# Patient Record
Sex: Female | Born: 1945 | Race: Black or African American | Hispanic: No | Marital: Married | Smoking: Never smoker
Health system: Southern US, Community
[De-identification: ages and names within clinical notes are randomized; demographics above are authoritative.]

## PROBLEM LIST (undated history)

## (undated) DIAGNOSIS — Z8619 Personal history of other infectious and parasitic diseases: Secondary | ICD-10-CM

## (undated) DIAGNOSIS — K589 Irritable bowel syndrome without diarrhea: Secondary | ICD-10-CM

## (undated) DIAGNOSIS — M810 Age-related osteoporosis without current pathological fracture: Secondary | ICD-10-CM

## (undated) DIAGNOSIS — R300 Dysuria: Secondary | ICD-10-CM

## (undated) DIAGNOSIS — I480 Paroxysmal atrial fibrillation: Secondary | ICD-10-CM

## (undated) DIAGNOSIS — R011 Cardiac murmur, unspecified: Secondary | ICD-10-CM

## (undated) DIAGNOSIS — E039 Hypothyroidism, unspecified: Secondary | ICD-10-CM

## (undated) DIAGNOSIS — E7800 Pure hypercholesterolemia, unspecified: Secondary | ICD-10-CM

## (undated) DIAGNOSIS — R229 Localized swelling, mass and lump, unspecified: Secondary | ICD-10-CM

## (undated) DIAGNOSIS — Z8739 Personal history of other diseases of the musculoskeletal system and connective tissue: Secondary | ICD-10-CM

## (undated) DIAGNOSIS — J349 Unspecified disorder of nose and nasal sinuses: Secondary | ICD-10-CM

## (undated) DIAGNOSIS — K5790 Diverticulosis of intestine, part unspecified, without perforation or abscess without bleeding: Secondary | ICD-10-CM

## (undated) DIAGNOSIS — R319 Hematuria, unspecified: Secondary | ICD-10-CM

## (undated) DIAGNOSIS — I10 Essential (primary) hypertension: Secondary | ICD-10-CM

## (undated) DIAGNOSIS — M7130 Other bursal cyst, unspecified site: Secondary | ICD-10-CM

## (undated) DIAGNOSIS — L280 Lichen simplex chronicus: Secondary | ICD-10-CM

## (undated) DIAGNOSIS — M1990 Unspecified osteoarthritis, unspecified site: Secondary | ICD-10-CM

## (undated) DIAGNOSIS — K5792 Diverticulitis of intestine, part unspecified, without perforation or abscess without bleeding: Secondary | ICD-10-CM

## (undated) HISTORY — DX: Unspecified osteoarthritis, unspecified site: M19.90

## (undated) HISTORY — DX: Lichen simplex chronicus: L28.0

## (undated) HISTORY — DX: Other bursal cyst, unspecified site: M71.30

## (undated) HISTORY — DX: Unspecified disorder of nose and nasal sinuses: J34.9

## (undated) HISTORY — PX: ABDOMINAL HYSTERECTOMY: SHX81

## (undated) HISTORY — DX: Personal history of other diseases of the musculoskeletal system and connective tissue: Z87.39

## (undated) HISTORY — DX: Hematuria, unspecified: R31.9

## (undated) HISTORY — DX: Paroxysmal atrial fibrillation: I48.0

## (undated) HISTORY — PX: AV FISTULA REPAIR: SHX563

## (undated) HISTORY — PX: FOOT SURGERY: SHX648

## (undated) HISTORY — PX: VAGINAL HYSTERECTOMY: SHX2639

## (undated) HISTORY — DX: Cardiac murmur, unspecified: R01.1

## (undated) HISTORY — DX: Age-related osteoporosis without current pathological fracture: M81.0

## (undated) HISTORY — PX: BUNIONECTOMY: SHX129

## (undated) HISTORY — DX: Personal history of other infectious and parasitic diseases: Z86.19

## (undated) HISTORY — DX: Irritable bowel syndrome, unspecified: K58.9

## (undated) HISTORY — DX: Dysuria: R30.0

## (undated) HISTORY — PX: ANKLE SURGERY: SHX546

## (undated) HISTORY — DX: Essential (primary) hypertension: I10

## (undated) HISTORY — PX: APPENDECTOMY: SHX54

## (undated) HISTORY — PX: COLONOSCOPY: SHX174

## (undated) HISTORY — DX: Localized swelling, mass and lump, unspecified: R22.9

## (undated) HISTORY — PX: VESICOVAGINAL FISTULA CLOSURE W/ TAH: SUR271

---

## 2009-06-10 ENCOUNTER — Ambulatory Visit: Payer: Self-pay | Admitting: Family Medicine

## 2010-07-20 ENCOUNTER — Ambulatory Visit: Payer: Self-pay | Admitting: Family Medicine

## 2011-09-21 ENCOUNTER — Ambulatory Visit: Payer: Self-pay | Admitting: Podiatry

## 2011-10-26 ENCOUNTER — Ambulatory Visit: Payer: Self-pay | Admitting: Family Medicine

## 2012-11-04 ENCOUNTER — Encounter: Payer: Self-pay | Admitting: Podiatry

## 2012-11-11 ENCOUNTER — Ambulatory Visit: Payer: Self-pay | Admitting: Family Medicine

## 2012-11-18 ENCOUNTER — Ambulatory Visit: Payer: Self-pay | Admitting: Family Medicine

## 2012-11-19 ENCOUNTER — Encounter: Payer: Self-pay | Admitting: Podiatry

## 2012-12-25 ENCOUNTER — Ambulatory Visit: Payer: Self-pay | Admitting: Otolaryngology

## 2013-02-12 ENCOUNTER — Ambulatory Visit: Payer: Self-pay | Admitting: Podiatry

## 2013-02-27 ENCOUNTER — Ambulatory Visit: Payer: Self-pay | Admitting: Podiatry

## 2013-03-05 ENCOUNTER — Ambulatory Visit: Payer: Self-pay | Admitting: Podiatry

## 2013-03-18 ENCOUNTER — Encounter: Payer: Self-pay | Admitting: Podiatry

## 2013-03-19 ENCOUNTER — Encounter: Payer: Self-pay | Admitting: Podiatry

## 2013-03-19 ENCOUNTER — Ambulatory Visit (INDEPENDENT_AMBULATORY_CARE_PROVIDER_SITE_OTHER): Payer: Medicare Other | Admitting: Podiatry

## 2013-03-19 VITALS — BP 120/74 | HR 71 | Resp 16 | Ht 69.0 in | Wt 206.0 lb

## 2013-03-19 DIAGNOSIS — M76829 Posterior tibial tendinitis, unspecified leg: Secondary | ICD-10-CM

## 2013-03-19 DIAGNOSIS — M76822 Posterior tibial tendinitis, left leg: Secondary | ICD-10-CM

## 2013-03-19 NOTE — Progress Notes (Signed)
Rebecca Anthony presents today for followup of her posterior tibial tendon repair for left foot. She states while she was at the beach she walk on the beach with certain shoes which seem to stress the tendon somewhat and she was concerned about this. I answered questions today regarding shoe type and expectations after the surgery. She is also concerned about a mild hallux interphalangeal is present left hallux.  Objective: Vital signs are stable she is alert and oriented x3 pulses to the left foot are palpable. Mild edema around the surgical site with mild tenderness of the posterior tibial tendon on palpation. Evaluation of the first metatarsophalangeal joint demonstrates a very good range of motion a very mild hallux interphalangeal with early osteoarthritic changes suspected.  Assessment: Probably a mild posterior tibial tendinitis and hallux interphalangeal left foot.  Plan: Continue conservative therapies such as rest ice compression and elevation for the mild tendinitis as well as anti-inflammatories consisting of 600 mg of ibuprofen 3 times a day with food. Followup with me as needed.

## 2013-07-23 ENCOUNTER — Ambulatory Visit: Payer: Self-pay | Admitting: Family Medicine

## 2013-08-18 ENCOUNTER — Ambulatory Visit: Payer: Self-pay | Admitting: Anesthesiology

## 2013-08-18 ENCOUNTER — Ambulatory Visit: Payer: Self-pay | Admitting: Gastroenterology

## 2013-08-18 LAB — HM COLONOSCOPY

## 2013-08-26 ENCOUNTER — Encounter: Payer: Self-pay | Admitting: *Deleted

## 2013-08-27 ENCOUNTER — Encounter (INDEPENDENT_AMBULATORY_CARE_PROVIDER_SITE_OTHER): Payer: Self-pay

## 2013-08-27 ENCOUNTER — Ambulatory Visit (INDEPENDENT_AMBULATORY_CARE_PROVIDER_SITE_OTHER): Payer: Medicare Other | Admitting: Cardiology

## 2013-08-27 ENCOUNTER — Encounter: Payer: Self-pay | Admitting: Cardiology

## 2013-08-27 VITALS — BP 104/62 | HR 60 | Ht 69.0 in | Wt 195.0 lb

## 2013-08-27 DIAGNOSIS — I480 Paroxysmal atrial fibrillation: Secondary | ICD-10-CM

## 2013-08-27 DIAGNOSIS — R9431 Abnormal electrocardiogram [ECG] [EKG]: Secondary | ICD-10-CM

## 2013-08-27 DIAGNOSIS — I4891 Unspecified atrial fibrillation: Secondary | ICD-10-CM

## 2013-08-27 DIAGNOSIS — R011 Cardiac murmur, unspecified: Secondary | ICD-10-CM

## 2013-08-27 DIAGNOSIS — I10 Essential (primary) hypertension: Secondary | ICD-10-CM

## 2013-08-27 MED ORDER — ASPIRIN 81 MG PO TABS: 81.0000 mg | ORAL_TABLET | Freq: Every day | ORAL | Status: DC

## 2013-08-27 NOTE — Patient Instructions (Addendum)
Your physician has recommended you make the following change in your medication:  Start Aspirin 81 mg daily  Stop Lotensin for now   Your physician has requested that you have an echocardiogram. Echocardiography is a painless test that uses sound waves to create images of your heart. It provides your doctor with information about the size and shape of your heart and how well your heart's chambers and valves are working. This procedure takes approximately one hour. There are no restrictions for this procedure.   Your physician has recommended that you wear an event monitor. Event monitors are medical devices that record the heart's electrical activity. Doctors most often [place] these monitors to diagnose arrhythmias. Arrhythmias are problems with the speed or rhythm of the heartbeat. The monitor is a small, portable device. You can wear one while you do your normal daily activities. This is usually used to diagnose what is causing palpitations/syncope (passing out).   [place]  Your caregiver has ordered a Stress Test with nuclear imaging. The purpose of this test is to evaluate the blood supply to your heart muscle. This procedure is referred to as a "Non-Invasive Stress Test." This is because other than having an IV started in your vein, nothing is inserted or "invades" your body. Cardiac stress tests are done to find areas of poor blood flow to the heart by determining the extent of coronary artery disease (CAD). Some patients exercise on a treadmill, which naturally increases the blood flow to your heart, while others who are  unable to walk on a treadmill due to physical limitations have a pharmacologic/chemical stress agent called Lexiscan . This medicine will mimic walking on a treadmill by temporarily increasing your coronary blood flow.   Please note: these test may take anywhere between 2-4 hours to complete  PLEASE REPORT TO [place] AT THE FIRST DESK WILL  DIRECT YOU WHERE TO GO  Date of Procedure:_____________________________________  Arrival Time for Procedure:______________________________  Instructions regarding medication:     _x_:  Hold betablocker(s) night before procedure and morning of procedure: Metoprolol    PLEASE NOTIFY THE OFFICE AT LEAST 24 HOURS IN ADVANCE IF YOU ARE UNABLE TO KEEP YOUR APPOINTMENT.  [phone] AND  PLEASE NOTIFY NUCLEAR MEDICINE AT [facility] AT LEAST 24 HOURS IN ADVANCE IF YOU ARE UNABLE TO KEEP YOUR APPOINTMENT. [phone]  How to prepare for your Myoview test:  1. Do not eat or drink after midnight 2. No caffeine for 24 hours prior to test 3. No smoking 24 hours prior to test. 4. Your medication may be taken with water.  If your doctor stopped a medication because of this test, do not take that medication. 5. Ladies, please do not wear dresses.  Skirts or pants are appropriate. Please wear a short sleeve shirt. 6. No perfume, cologne or lotion. 7. Wear comfortable walking shoes. No heels!       Your physician recommends that you schedule a follow-up appointment in:  With Dr. Ellyn Hack after your test

## 2013-08-28 ENCOUNTER — Telehealth: Payer: Self-pay

## 2013-08-28 NOTE — Telephone Encounter (Signed)
Just wear it as long as she will.   Maybe that will be helpful.  Leonie Man, MD

## 2013-08-28 NOTE — Telephone Encounter (Signed)
Pt has some questions about tests that has been ordered, also wants to talk about a monitor. Please call.

## 2013-08-28 NOTE — Telephone Encounter (Signed)
That totally defeats the purpose of why I want her to wear it -- but whatever, if she isn't willing, she isnt willing.  I still don't think she gets why I wanted to use it.  I am trying to see how much Afib burden she has, in order to determine the appropriate decision about blood thinner vs. Aspirin alone.    4 days will not give me much of a data point.    Not going to force her -- I spent > 45 min with her yesterday trying to explain things.  Based upon her wishes - we will use ASA only & stay on BB.  She must be fine with the risk of stroke being higher with ASA than either warfarin & the NOAC agents.  Leonie Man, MD

## 2013-08-28 NOTE — Telephone Encounter (Signed)
Patient does not want to wear cardiac monitor for 30 days  She would like to wear it for around 4 days  Is this negotiable?

## 2013-08-28 NOTE — Telephone Encounter (Signed)
Now not know. Sorry typo

## 2013-08-28 NOTE — Telephone Encounter (Signed)
Pt scheduled for echo and myoview  Patient given time, date and instructions  Patient verbalized understanding   Myoview on 09/01/13 @10  am

## 2013-08-28 NOTE — Telephone Encounter (Signed)
So should we just cancel event monitor all together for know?

## 2013-08-29 ENCOUNTER — Telehealth: Payer: Self-pay | Admitting: *Deleted

## 2013-08-29 ENCOUNTER — Encounter: Payer: Self-pay | Admitting: Cardiology

## 2013-08-29 DIAGNOSIS — I10 Essential (primary) hypertension: Secondary | ICD-10-CM | POA: Insufficient documentation

## 2013-08-29 DIAGNOSIS — R011 Cardiac murmur, unspecified: Secondary | ICD-10-CM | POA: Insufficient documentation

## 2013-08-29 DIAGNOSIS — R9431 Abnormal electrocardiogram [ECG] [EKG]: Secondary | ICD-10-CM | POA: Insufficient documentation

## 2013-08-29 DIAGNOSIS — I480 Paroxysmal atrial fibrillation: Secondary | ICD-10-CM | POA: Insufficient documentation

## 2013-08-29 NOTE — Assessment & Plan Note (Signed)
This does seem like a chronic diagnosis for her. I have no concept of water to A. fib burden is. I went through the explanation of what atrial fibrillation is mechanistically, and the concerns of heart failure versus stroke.  I spent probably 20 minutes going over the various options falling on her CHA2DS2Vasc score of 57 (age, female sex, and hypertension).   The final issue is that she is at risk for stroke and should consider being on full anticoagulation with either a NOAC or warfarin. She is very reluctant to consider doing this, stating that she was to stay as conservative as possible.  My overall recommendation was for her to wear an event monitor for a month in order for [place] to determine her A. fib burden. If high, this was used more toward falling evaluation, however if she has very rare/bleeding episodes, while not favorable, aspirin would be likely be less under treatment.  Plan: Continue metoprolol, start with baby aspirin today  LexiScan Myoview to assess for potential ischemic etiology of A. fib given her age and hypertension. Using LexiScan to avoid having to hold her beta blocker.  30 day event monitor (provided she will wear it)  2-D echocardiogram to assess LV function and chamber sizes, most notably the left atrial size.  If she remains relatively asymptomatic with short bursts, my recommendation would be to simply stay with rate control as opposed to rhythm control.

## 2013-08-29 NOTE — Progress Notes (Signed)
PATIENTMartika Anthony MRN: [id number] DOB: Jul 06, 1945 PCP: Ashok Norris, MD  Clinic Note: Chief Complaint  Patient presents with  . other    Ref by PCP due to abn ekg. Meds reviewed verbally with pt.   HPI: Rebecca Anthony is a 68 y.o. female with a PMH below who presents today for cardiology consultation for atrial fibrillation. She currently has a distant history of having atrial fibrillation years ago in the past and was treated with quinidine when she was living in [place].  She stopped taking it for some reason and was doing relatively well.  She was in her usual state of health, but had an episode of rapid A. fib following her colonoscopy at [facility]. She relates a story that she was feeling a little bit woozy after the study, and drank a can of Coke shortly after that she went in this rapid rhythm. EKG demonstrated A. fib with RVR heart rate 109. She was treated with IV medications and then sent home to followup with her PCP. She was started on metoprolol for rate control, and comes in today in normal sinus rhythm feeling well.  Interval History: Since that one episode, she has noted a few times where she's felt that her heart will beat fast and pounding sensation. She feels a little dizzy and jittery when this happens but doesn't really note feeling short of breath. When it occurs she may get old short of breath with exertion however. Most of these episodes only last 15-20 minutes. The longest has been a couple hours. She did note that in the past that she drank caffeine it would stimulate his spells. She is therefore cut back her caffeine intake significantly. His spells are also exacerbated by stress and sweets. She has adjusted her diet and tries to remain calm.   When she went to her PCP she described the sensation of fluttering and palpitations. However she denies any syncope or near significant no TIA or amaurosis fugax symptoms. No melena, hematochezia, hematuria, epistaxis. She also  denies any heart failure symptoms and PND, orthopnea or edema. No chest tightness or pressure at rest or exertion.  Past Medical History  Diagnosis Date  . Sinus problem   . History of swelling of feet   . Osteoporosis, unspecified   . Synovial cyst, unspecified   . Osteoarthrosis, unspecified whether generalized or localized, unspecified site   . Hypertension   . PAF (paroxysmal atrial fibrillation)     Diagnosed years ago -- originally on Quinidine  . Lichenification and lichen simplex chronicus   . Localized superficial swelling, mass, or lump   . Dysuria   . Irritable bowel syndrome   . Hematuria, unspecified   . Heart murmur     Prior Cardiac Evaluation and Past Surgical History: Past Surgical History  Procedure Laterality Date  . Appendectomy    . Vesicovaginal fistula closure w/ tah    . Av fistula repair    . Foot surgery Left   . Vaginal hysterectomy      Allergies  Allergen Reactions  . Sulfa Antibiotics   . Anaprox [Naproxen Sodium] Rash    Current Outpatient Prescriptions  Medication Sig Dispense Refill  . alendronate (FOSAMAX) 70 MG tablet Take 70 mg by mouth once a week. Take with a full glass of water on an empty stomach.      . cetirizine (ZYRTEC) 10 MG tablet Take 10 mg by mouth as needed.       . Cholecalciferol (VITAMIN D3) 50000  UNITS CAPS Take by mouth once a week.      . metoprolol succinate (TOPROL-XL) 25 MG 24 hr tablet Take 25 mg by mouth daily.       . montelukast (SINGULAIR) 10 MG tablet Take 10 mg by mouth at bedtime.       Marland Kitchen omeprazole (PRILOSEC) 40 MG capsule Take 40 mg by mouth daily.       . ranitidine (ZANTAC) 150 MG tablet Take 150 mg by mouth at bedtime.       Marland Kitchen aspirin 81 MG tablet Take 1 tablet (81 mg total) by mouth daily.  30 tablet     No current facility-administered medications for this visit.   History   Social History Narrative   She is married. Recently moved from [facility] back to [place].   Never drinks alcohol. Has never smoked.    Minimal amount of caffeine intake.   She is active, but not with regular exercise.   Family History:  Diabetes in her mother; Heart disease in her father; Heart failure in her mother; High blood pressure in her father; Hypertension in her father and mother.  ROS: A comprehensive Review of Systems - Negative except Episodes noted above in history of present illness. Otherwise she is doing well. No recent boluses. No GI or GU symptoms.  PHYSICAL EXAM BP 104/62  Pulse 60  Ht 5\' 9"  (1.753 m)  Wt 195 lb (88.451 kg)  BMI 28.78 kg/m2 General appearance: alert, cooperative, appears stated age, no distress and  pleaasantt, well-nourished and well-groomed. Answers questions appropriately. HEENT: [place]/AT, EOMI, MMM, anicteric sclera Neck: no adenopathy, no carotid bruit, no JVD, supple, symmetrical, trachea midline and thyroid not enlarged, symmetric, no tenderness/mass/nodules Lungs: clear to auscultation bilaterally, normal percussion bilaterally and  nonlabored, good air movement Heart: regular rate and rhythm, S1, S2 normal, no click, rub or gallop and normal apical impulse; possible soft 1/6 SEM at RUSB. Abdomen: soft, non-tender; bowel sounds normal; no masses,  no organomegaly Extremities: extremities normal, atraumatic, no cyanosis or edema Pulses: 2+ and symmetric Skin: Skin color, texture, turgor normal. No rashes or lesions Neurologic: Alert and oriented X 3, normal strength and tone. Normal symmetric reflexes. Normal coordination and gait; CN II-1XII grossly intact   Adult ECG Report --  clinic visit  Rate:  60 ;  Rhythm: normal sinus rhythm; normal axis, intervals, low voltage.  Narrative Interpretation:  normal EKG  EKG: From [facility] 5/4. atrial fibrillation, rate 109 (RVR), with PVCs or aberrantly conducted complexes. He further Nonspecific ST and T waves changes the  Recent Labs: labs ordered by PCP. Not resulted   ASSESSMENT / PLAN:  68 year old woman with history of PAF, who  sounds like she has more episodes and has been on 2 but was surreptitiously noted to be in A. fib with RVR following her colonoscopy. It did seem to be triggered by caffeine. She seems to be tolerating metoprolol well without any problems.   PAF (paroxysmal atrial fibrillation) This does seem like a chronic diagnosis for her. I have no concept of water to A. fib burden is. I went through the explanation of what atrial fibrillation is mechanistically, and the concerns of heart failure versus stroke.  I spent probably 20 minutes going over the various options falling on her CHA2DS2Vasc score of 41 (age, female sex, and hypertension).   The final issue is that she is at risk for stroke and should consider being on full anticoagulation with either a NOAC or warfarin. She  is very reluctant to consider doing this, stating that she was to stay as conservative as possible.  My overall recommendation was for her to wear an event monitor for a month in order for [place] to determine her A. fib burden. If high, this was used more toward falling evaluation, however if she has very rare/bleeding episodes, while not favorable, aspirin would be likely be less under treatment.  Plan: Continue metoprolol, start with baby aspirin today  LexiScan Myoview to assess for potential ischemic etiology of A. fib given her age and hypertension. Using LexiScan to avoid having to hold her beta blocker.  30 day event monitor (provided she will wear it)  2-D echocardiogram to assess LV function and chamber sizes, most notably the left atrial size.  If she remains relatively asymptomatic with short bursts, my recommendation would be to simply stay with rate control as opposed to rhythm control.  Hypertension She had felt a little dizzy this morning her blood pressure is lower low. I cut her ACE inhibitor/HCTZ for now. Will immediately go back to 1/2 dose once we see how her blood pressure plays out with her Toprol.  Abnormal  EKG Showing a few PVCs  Heart murmur I don't hear much of a murmur on exam, however we will assess with echocardiogram.   Orders Placed This Encounter  Procedures  . Myocardial Perfusion Imaging    Standing Status: Future     Number of Occurrences:      Standing Expiration Date: 08/27/2014    Scheduling Instructions:     To be performed at [facility]    Order Specific Question:  Where should this test be performed    Answer:  Other    Order Specific Question:  Type of stress    Answer:  Exercise    Order Specific Question:  Patient weight in lbs    Answer:  195  . EKG 12-Lead    Order Specific Question:  Where should this test be performed    Answer:  [facility]  . Cardiac event monitor    Standing Status: Future     Number of Occurrences:      Standing Expiration Date: 08/27/2014  . 2D Echocardiogram without contrast    Standing Status: Future     Number of Occurrences:      Standing Expiration Date: 08/27/2014    Order Specific Question:  Type of Echo    Answer:  Complete    Order Specific Question:  Where should this test be performed    Answer:  [facility]    Order Specific Question:  Reason for exam-Echo    Answer:  Atrail Fibrillation  427.31   Meds ordered this encounter  Medications  . metoprolol succinate (TOPROL-XL) 25 MG 24 hr tablet    Sig: Take 25 mg by mouth daily.   Marland Kitchen aspirin 81 MG tablet    Sig: Take 1 tablet (81 mg total) by mouth daily.    Dispense:  30 tablet    Followup: 1.5 months  Harel Repetto W. Ellyn Hack, M.D., M.S. Interventional Cardiolgy [facility]

## 2013-08-29 NOTE — Assessment & Plan Note (Signed)
She had felt a little dizzy this morning her blood pressure is lower low. I cut her ACE inhibitor/HCTZ for now. Will immediately go back to 1/2 dose once we see how her blood pressure plays out with her Toprol.

## 2013-08-29 NOTE — Telephone Encounter (Signed)
Patient has decided to cancel her stress test  She stated she needs to "think on it"  Called Tammy at [facility] scheduling to cancel

## 2013-08-29 NOTE — Assessment & Plan Note (Signed)
Showing a few PVCs

## 2013-08-29 NOTE — Telephone Encounter (Signed)
Patient called and needs to reschedule  stress test. Please call patient

## 2013-08-29 NOTE — Assessment & Plan Note (Signed)
I don't hear much of a murmur on exam, however we will assess with echocardiogram.

## 2013-09-01 ENCOUNTER — Telehealth: Payer: Self-pay | Admitting: *Deleted

## 2013-09-01 NOTE — Telephone Encounter (Signed)
Informed patient that Dr. Ellyn Hack would like for her to wear 30 day holter as long as she can  Patient stated she will decide about wearing the holter after her echo

## 2013-09-01 NOTE — Telephone Encounter (Signed)
OK ° °DH °

## 2013-09-09 ENCOUNTER — Other Ambulatory Visit: Payer: Self-pay

## 2013-09-09 ENCOUNTER — Other Ambulatory Visit (INDEPENDENT_AMBULATORY_CARE_PROVIDER_SITE_OTHER): Payer: Medicare Other

## 2013-09-09 DIAGNOSIS — I4891 Unspecified atrial fibrillation: Secondary | ICD-10-CM

## 2013-09-09 DIAGNOSIS — R9431 Abnormal electrocardiogram [ECG] [EKG]: Secondary | ICD-10-CM

## 2013-09-09 DIAGNOSIS — I480 Paroxysmal atrial fibrillation: Secondary | ICD-10-CM

## 2013-09-10 NOTE — Progress Notes (Signed)
Quick Note:  Echo results: Good news: Essentially normal echocardiogram and normal pump function and normal valve function. No signs to suggest heart attack.. EF: 55-60%. No regional wall motion abnormalities  Rebecca Honse W [name], MD   ______ 

## 2013-12-17 ENCOUNTER — Ambulatory Visit: Payer: Self-pay | Admitting: Family Medicine

## 2014-04-15 ENCOUNTER — Telehealth: Payer: Self-pay

## 2014-04-15 NOTE — Telephone Encounter (Signed)
Called and left message for pt to call back to schedule appt to see dr Milinda Pointer.

## 2014-04-15 NOTE — Telephone Encounter (Signed)
Pt called stating that she was seen 2 years ago and had a prescription written for PT, she wants to know if she needs another prescription written or if she needs to be seen

## 2014-06-08 ENCOUNTER — Encounter: Payer: Self-pay | Admitting: Podiatry

## 2014-06-08 ENCOUNTER — Ambulatory Visit (INDEPENDENT_AMBULATORY_CARE_PROVIDER_SITE_OTHER): Payer: Medicare Other | Admitting: Podiatry

## 2014-06-08 VITALS — BP 126/82 | HR 67 | Resp 12

## 2014-06-08 DIAGNOSIS — Q828 Other specified congenital malformations of skin: Secondary | ICD-10-CM | POA: Diagnosis not present

## 2014-06-08 DIAGNOSIS — M76822 Posterior tibial tendinitis, left leg: Secondary | ICD-10-CM

## 2014-06-08 NOTE — Progress Notes (Signed)
   Subjective:    Patient ID: Jerene Canny, female    DOB: 10/12/1945, 69 y.o.   MRN: [id number]  HPI PT STATED B/L BALL OF THE FOOT HAVE CALLUS AND BEEN PAINFUL FOR 2 WEEKS. CALLUS LOOKS THE SAME BUT WHEN PUTTING PRESSURE ON IT OR WALKING FOR VERY LONG FEET GET REALLY SENSITIVE. TRIED SOAK WITH EPSON SALT BUT NO HELP.   Review of Systems  All other systems reviewed and are negative.      Objective:   Physical Exam: I have reviewed her past medical history medications allergies surgeon social history. Pulses remain palpable left foot. She has poor keratosis sub-first and sub-second metatarsophalangeal joints of the left foot. These are consistent with abnormal pressures across the forefoot. She still has weakness in the posterior tibial tendon after her [facility] repair.        Assessment & Plan:  Assessment: Posterior tibial tendinitis with porokeratosis of the sub-first and sub-second metatarsophalangeal joint left foot. Pes planus bilateral.  Plan: We are referring her to physical therapy for chronic weakness of the posterior tibial tendon and an unsteady gait. At this point we are requesting physical therapy to perform gait training and strength training for her left foot and ankle. I also debrided all reactive hyperkeratoses for her today. We discussed appropriate shoe gear and the use of a short heeled shoe as a dress shoe.

## 2014-09-01 ENCOUNTER — Telehealth: Payer: Self-pay | Admitting: Podiatry

## 2014-09-01 NOTE — Telephone Encounter (Signed)
Spoke with patient-she feels that she is benefiting from PT. She would like to continue for a few more sessions. I sent over another PT from to [facility] stating 8 sessions total or 2-3 x weekly x 4 weeks.

## 2014-09-01 NOTE — Telephone Encounter (Signed)
Called patient-L/M-requested she call back and let us know how she was doing after completing PT and whether she felt she needed to continue PT.

## 2014-09-01 NOTE — Telephone Encounter (Signed)
PT CALLED ASKING IF SHE NEEDS TO CONTINUE THERAPY. SHE HAS COMPLETED THE ALLOTTED AMOUNT OF THERAPY AND THE THERAPIST SAID IF SHE NEEDED ADDITIONAL THEY WOULD NEED A NEW ORDER. PLEASE CALL PT BACK AND LET HER KNOW WHAT SHE NEEDS TO DO.

## 2014-10-12 ENCOUNTER — Telehealth: Payer: Self-pay | Admitting: Family Medicine

## 2014-10-12 NOTE — Telephone Encounter (Signed)
In one month pt will need refill of Alendronate 70 MG [facility] on [address] said she was told to call the office when it was time for a refill.

## 2014-11-15 ENCOUNTER — Other Ambulatory Visit: Payer: Self-pay | Admitting: Family Medicine

## 2014-12-14 ENCOUNTER — Other Ambulatory Visit: Payer: Self-pay | Admitting: Family Medicine

## 2015-03-08 ENCOUNTER — Other Ambulatory Visit: Payer: Self-pay | Admitting: Family Medicine

## 2015-06-21 ENCOUNTER — Other Ambulatory Visit: Payer: Self-pay | Admitting: Family Medicine

## 2015-06-25 ENCOUNTER — Telehealth: Payer: Self-pay | Admitting: Family Medicine

## 2015-06-25 NOTE — Telephone Encounter (Signed)
Patient is requesting a refill on Alendronate. She takes it once weekly but is asking for enough to last until her appointment (08-19-15). Please send to [facility]

## 2015-06-28 MED ORDER — ALENDRONATE SODIUM 70 MG PO TABS: 70.0000 mg | ORAL_TABLET | ORAL | Status: DC

## 2015-08-19 ENCOUNTER — Encounter: Payer: Self-pay | Admitting: Family Medicine

## 2015-08-19 ENCOUNTER — Telehealth: Payer: Self-pay | Admitting: Family Medicine

## 2015-08-19 NOTE — Telephone Encounter (Signed)
Patient has scheduled appointment for her physical on 09-16-15. She will be out of Metoprolol 25mg . Please send to [facility]

## 2015-08-20 MED ORDER — METOPROLOL SUCCINATE ER 25 MG PO TB24: 25.0000 mg | ORAL_TABLET | Freq: Every day | ORAL | Status: DC

## 2015-08-20 NOTE — Telephone Encounter (Signed)
Left voice message.

## 2015-08-20 NOTE — Telephone Encounter (Signed)
Refill has been sent to pt pharmacy but pt needs to make appointment with Manuella Ghazi for her CPE please schedule.

## 2015-09-16 ENCOUNTER — Encounter: Payer: Self-pay | Admitting: Family Medicine

## 2015-10-07 ENCOUNTER — Encounter: Payer: Self-pay | Admitting: Family Medicine

## 2015-10-14 ENCOUNTER — Encounter: Payer: Self-pay | Admitting: Family Medicine

## 2015-10-28 ENCOUNTER — Encounter: Payer: Self-pay | Admitting: Family Medicine

## 2015-11-01 ENCOUNTER — Telehealth: Payer: Self-pay | Admitting: Family Medicine

## 2015-11-01 NOTE — Telephone Encounter (Signed)
Patient has appointment for 11/11/15. She is completely out of Alendronate 70mg  (only take it once weekly). She is completely out and is asking for you to send to [facility] [address]

## 2015-11-01 NOTE — Telephone Encounter (Signed)
Patient has no history of osteoporosis or fracture listed in her chart, therefore I'm not sure why is she on Fosamax 70 mg weekly. She will need an appointment before any medications can be prescribed.

## 2015-11-02 NOTE — Telephone Encounter (Signed)
Patient already have appointment for this month. She was verbally informed that prescription was not sent to pharmacy.

## 2015-11-11 ENCOUNTER — Encounter: Payer: Self-pay | Admitting: Family Medicine

## 2015-11-25 ENCOUNTER — Telehealth: Payer: Self-pay | Admitting: Family Medicine

## 2015-11-25 ENCOUNTER — Encounter: Payer: Self-pay | Admitting: Family Medicine

## 2015-11-25 ENCOUNTER — Telehealth: Payer: Self-pay

## 2015-11-25 ENCOUNTER — Other Ambulatory Visit: Payer: Self-pay | Admitting: Family Medicine

## 2015-11-25 ENCOUNTER — Ambulatory Visit (INDEPENDENT_AMBULATORY_CARE_PROVIDER_SITE_OTHER): Payer: Medicare Other | Admitting: Family Medicine

## 2015-11-25 VITALS — BP 138/79 | HR 112 | Temp 98.3°F | Resp 16 | Ht 69.0 in | Wt 214.5 lb

## 2015-11-25 DIAGNOSIS — Z0000 Encounter for general adult medical examination without abnormal findings: Secondary | ICD-10-CM | POA: Diagnosis not present

## 2015-11-25 DIAGNOSIS — M8580 Other specified disorders of bone density and structure, unspecified site: Secondary | ICD-10-CM

## 2015-11-25 DIAGNOSIS — I499 Cardiac arrhythmia, unspecified: Secondary | ICD-10-CM | POA: Diagnosis not present

## 2015-11-25 DIAGNOSIS — Z1231 Encounter for screening mammogram for malignant neoplasm of breast: Secondary | ICD-10-CM

## 2015-11-25 DIAGNOSIS — Z23 Encounter for immunization: Secondary | ICD-10-CM | POA: Diagnosis not present

## 2015-11-25 DIAGNOSIS — R9431 Abnormal electrocardiogram [ECG] [EKG]: Secondary | ICD-10-CM | POA: Diagnosis not present

## 2015-11-25 LAB — POCT GLYCOSYLATED HEMOGLOBIN (HGB A1C): HEMOGLOBIN A1C: 5.7

## 2015-11-25 MED ORDER — ALENDRONATE SODIUM 70 MG PO TABS: 70.0000 mg | ORAL_TABLET | ORAL | 0 refills | Status: DC

## 2015-11-25 MED ORDER — METOPROLOL SUCCINATE ER 25 MG PO TB24: 25.0000 mg | ORAL_TABLET | Freq: Every day | ORAL | 0 refills | Status: DC

## 2015-11-25 NOTE — Telephone Encounter (Signed)
Medication has been refilled and sent to [facility] [address] 

## 2015-11-25 NOTE — Progress Notes (Signed)
Name: Rebecca Anthony   MRN: [id number]    DOB: June 27, 1945   Date:11/25/2015       Progress Note  Subjective  Chief Complaint  Chief Complaint  Patient presents with  . Annual Exam    CPE    HPI  Pt. Is here for a Complete Physical Exam. Last DEXA Scan was April 2015, osteopenia by measurements, on Fosamax for at least 2 years. Last Mammogram in September 2015, was BI-RADS 1. Last colonoscopy was 2 years ago.   Past Medical History:  Diagnosis Date  . Dysuria   . Heart murmur   . Hematuria, unspecified   . History of shingles   . History of swelling of feet   . Hypertension   . Irritable bowel syndrome   . Lichenification and lichen simplex chronicus   . Localized superficial swelling, mass, or lump   . Osteoarthrosis, unspecified whether generalized or localized, unspecified site   . Osteoporosis, unspecified   . PAF (paroxysmal atrial fibrillation) ([place])    Diagnosed years ago -- originally on Quinidine  . Sinus problem   . Synovial cyst, unspecified     Past Surgical History:  Procedure Laterality Date  . ANKLE SURGERY Left   . APPENDECTOMY    . AV FISTULA REPAIR    . FOOT SURGERY Left   . VAGINAL HYSTERECTOMY    . VESICOVAGINAL FISTULA CLOSURE W/ TAH      Family History  Problem Relation Age of Onset  . Diabetes Mother   . Hypertension Mother   . Heart failure Mother   . High blood pressure Father   . Heart disease Father   . Hypertension Father     Social History   Social History  . Marital status: Married    Spouse name: N/A  . Number of children: N/A  . Years of education: N/A   Occupational History  . Not on file.   Social History Main Topics  . Smoking status: Never Smoker  . Smokeless tobacco: Never Used  . Alcohol use No  . Drug use: No  . Sexual activity: Yes   Other Topics Concern  . Not on file   Social History Narrative   She is married. Recently moved from [facility] back to [place].   Never drinks alcohol. Has never smoked.   Minimal  amount of caffeine intake.   She is active, but not with regular exercise.     Current Outpatient Prescriptions:  .  alendronate (FOSAMAX) 70 MG tablet, Take 1 tablet (70 mg total) by mouth once a week. Take with a full glass of water on an empty stomach., Disp: 12 tablet, Rfl: 0 .  aspirin 81 MG tablet, Take 1 tablet (81 mg total) by mouth daily., Disp: 30 tablet, Rfl:  .  Cholecalciferol (VITAMIN D3) 50000 UNITS CAPS, Take by mouth once a week., Disp: , Rfl:  .  metoprolol succinate (TOPROL-XL) 25 MG 24 hr tablet, Take 1 tablet (25 mg total) by mouth daily., Disp: 90 tablet, Rfl: 0 .  chlorpheniramine-HYDROcodone (TUSSIONEX) 10-8 MG/5ML LQCR, , Disp: , Rfl: 0 .  montelukast (SINGULAIR) 10 MG tablet, Take 10 mg by mouth at bedtime. , Disp: , Rfl:  .  neomycin-polymyxin b-dexamethasone (MAXITROL) 3.5-10000-0.1 SUSP, , Disp: , Rfl: 0 .  omeprazole (PRILOSEC) 40 MG capsule, Take 40 mg by mouth daily. , Disp: , Rfl:  .  ranitidine (ZANTAC) 150 MG tablet, Take 150 mg by mouth at bedtime. , Disp: , Rfl:   Allergies  Allergen Reactions  . Sulfa Antibiotics   . Anaprox [Naproxen Sodium] Rash     Review of Systems  Constitutional: Negative for chills, fever, malaise/fatigue and weight loss.  HENT: Negative for congestion, ear pain and sore throat.   Eyes: Negative for blurred vision and double vision.  Respiratory: Negative for cough and shortness of breath.   Cardiovascular: Positive for palpitations (intermittent palpitations, especially with caffeine.). Negative for chest pain and leg swelling.  Gastrointestinal: Negative for abdominal pain, blood in stool, diarrhea, heartburn, melena, nausea and vomiting.  Genitourinary: Negative for dysuria and hematuria.  Musculoskeletal: Negative for back pain, joint pain and myalgias.  Skin: Negative for rash.  Neurological: Negative for dizziness and headaches.  Psychiatric/Behavioral: Negative for depression. The patient is not nervous/anxious.      Objective  Vitals:   11/25/15 1404  BP: 138/79  Pulse: (!) 112  Resp: 16  Temp: 98.3 F (36.8 C)  TempSrc: Oral  SpO2: 96%  Weight: 214 lb 8 oz (97.3 kg)  Height: 5\' 9"  (1.753 m)    Physical Exam  Constitutional: She is oriented to person, place, and time and well-developed, well-nourished, and in no distress.  HENT:  Head: Normocephalic and atraumatic.  Cardiovascular: An irregular rhythm present. Tachycardia present.   Pulmonary/Chest: Effort normal and breath sounds normal. No respiratory distress. She has no wheezes. She has no rhonchi.  Abdominal: Soft. Bowel sounds are normal. There is no tenderness.  Musculoskeletal:       Right ankle: She exhibits no swelling.       Left ankle: She exhibits no swelling.  Neurological: She is alert and oriented to person, place, and time.  Skin: Skin is warm and dry.  Psychiatric: Mood, memory, affect and judgment normal.  Nursing note and vitals reviewed.      Assessment & Plan  1. Annual physical exam Age appropriate laboratory screenings obtained - COMPLETE METABOLIC PANEL WITH GFR - Lipid Profile - TSH - Vitamin D (25 hydroxy) - POCT HgB A1C  2. Irregular cardiac rhythm EKG shows sinus tachycardia with PACs, patient has history of paroxysmal atrial fibrillation according to her chart, she has seen cardiology. Encouraged to follow up with cardiologist - EKG 12-Lead  3. Need for shingles vaccine  - Varicella-zoster vaccine subcutaneous  4. Osteopenia Repeat bone scan  - DG Bone Density; Future   Ketih Goodie Asad A. [facility] 11/25/2015 2:36 PM

## 2015-11-26 NOTE — Telephone Encounter (Signed)
Reviewed medication list with patient and medication has been removed that she is no longer taking

## 2015-12-23 ENCOUNTER — Ambulatory Visit: Payer: Medicare Other

## 2015-12-23 ENCOUNTER — Other Ambulatory Visit: Payer: Medicare Other

## 2016-01-20 ENCOUNTER — Encounter: Payer: Self-pay | Admitting: Family Medicine

## 2016-01-20 ENCOUNTER — Ambulatory Visit (INDEPENDENT_AMBULATORY_CARE_PROVIDER_SITE_OTHER): Payer: Medicare Other | Admitting: Family Medicine

## 2016-01-20 VITALS — BP 138/82 | HR 95 | Temp 98.2°F | Resp 16 | Ht 69.0 in | Wt 217.0 lb

## 2016-01-20 DIAGNOSIS — R1032 Left lower quadrant pain: Secondary | ICD-10-CM

## 2016-01-20 DIAGNOSIS — K5900 Constipation, unspecified: Secondary | ICD-10-CM | POA: Diagnosis not present

## 2016-01-20 DIAGNOSIS — R350 Frequency of micturition: Secondary | ICD-10-CM | POA: Diagnosis not present

## 2016-01-20 DIAGNOSIS — Z23 Encounter for immunization: Secondary | ICD-10-CM

## 2016-01-20 DIAGNOSIS — R1031 Right lower quadrant pain: Secondary | ICD-10-CM

## 2016-01-20 DIAGNOSIS — R109 Unspecified abdominal pain: Secondary | ICD-10-CM | POA: Insufficient documentation

## 2016-01-20 LAB — POCT URINALYSIS DIPSTICK
Bilirubin, UA: NEGATIVE
Glucose, UA: NEGATIVE
Ketones, UA: NEGATIVE
Leukocytes, UA: NEGATIVE
Nitrite, UA: NEGATIVE
PROTEIN UA: NEGATIVE
SPEC GRAV UA: 1.025
UROBILINOGEN UA: NEGATIVE
pH, UA: 6

## 2016-01-20 MED ORDER — POLYETHYLENE GLYCOL 3350 17 GM/SCOOP PO POWD: 17.0000 g | Freq: Every day | ORAL | 1 refills | Status: DC

## 2016-01-20 NOTE — Progress Notes (Signed)
Name: Rebecca Anthony   MRN: [id number]    DOB: [birth year]   Date:01/20/2016       Progress Note  Subjective  Chief Complaint  Chief Complaint  Patient presents with  . Urinary Frequency    Onset- couple of days ago, states she has gone to the bathroom more frequently at night and having lower abdominal tenderness.    Abdominal Pain  This is a new problem. The current episode started in the past 7 days. The problem has been waxing and waning. The pain is located in the suprapubic region, RLQ and LLQ. The pain is at a severity of 2/10. The quality of the pain is aching. The abdominal pain does not radiate. Associated symptoms include constipation and frequency (increased frequency of urination for past 3 nights.). Pertinent negatives include no diarrhea, fever, flatus, hematochezia, nausea, vomiting or weight loss. The pain is aggravated by certain positions (benidng over, digging in the yard etc). The pain is relieved by recumbency.    Past Medical History:  Diagnosis Date  . Dysuria   . Heart murmur   . Hematuria, unspecified   . History of shingles   . History of swelling of feet   . Hypertension   . Irritable bowel syndrome   . Lichenification and lichen simplex chronicus   . Localized superficial swelling, mass, or lump   . Osteoarthrosis, unspecified whether generalized or localized, unspecified site   . Osteoporosis, unspecified   . PAF (paroxysmal atrial fibrillation) ([place])    Diagnosed years ago -- originally on Quinidine  . Sinus problem   . Synovial cyst, unspecified     Past Surgical History:  Procedure Laterality Date  . ANKLE SURGERY Left   . APPENDECTOMY    . AV FISTULA REPAIR    . FOOT SURGERY Left   . VAGINAL HYSTERECTOMY    . VESICOVAGINAL FISTULA CLOSURE W/ TAH      Family History  Problem Relation Age of Onset  . Diabetes Mother   . Hypertension Mother   . Heart failure Mother   . High blood pressure Father   . Heart disease Father   . Hypertension  Father     Social History   Social History  . Marital status: Married    Spouse name: N/A  . Number of children: N/A  . Years of education: N/A   Occupational History  . Not on file.   Social History Main Topics  . Smoking status: Never Smoker  . Smokeless tobacco: Never Used  . Alcohol use No  . Drug use: No  . Sexual activity: Yes   Other Topics Concern  . Not on file   Social History Narrative   She is married. Recently moved from [facility] back to [place].   Never drinks alcohol. Has never smoked.   Minimal amount of caffeine intake.   She is active, but not with regular exercise.     Current Outpatient Prescriptions:  .  alendronate (FOSAMAX) 70 MG tablet, Take 1 tablet (70 mg total) by mouth once a week. Take with a full glass of water on an empty stomach., Disp: 12 tablet, Rfl: 0 .  aspirin 81 MG tablet, Take 1 tablet (81 mg total) by mouth daily., Disp: 30 tablet, Rfl:  .  chlorpheniramine-HYDROcodone (TUSSIONEX) 10-8 MG/5ML LQCR, , Disp: , Rfl: 0 .  Cholecalciferol (VITAMIN D3) 50000 UNITS CAPS, Take by mouth once a week., Disp: , Rfl:  .  metoprolol succinate (TOPROL-XL) 25 MG 24 hr tablet,  Take 1 tablet (25 mg total) by mouth daily., Disp: 90 tablet, Rfl: 0 .  montelukast (SINGULAIR) 10 MG tablet, Take 10 mg by mouth at bedtime. , Disp: , Rfl:  .  neomycin-polymyxin b-dexamethasone (MAXITROL) 3.5-10000-0.1 SUSP, , Disp: , Rfl: 0 .  omeprazole (PRILOSEC) 40 MG capsule, Take 40 mg by mouth daily. , Disp: , Rfl:  .  ranitidine (ZANTAC) 150 MG tablet, Take 150 mg by mouth at bedtime. , Disp: , Rfl:   Allergies  Allergen Reactions  . Sulfa Antibiotics   . Anaprox [Naproxen Sodium] Rash     Review of Systems  Constitutional: Negative for chills, fever, malaise/fatigue and weight loss.  Gastrointestinal: Positive for abdominal pain and constipation. Negative for diarrhea, flatus, hematochezia, nausea and vomiting.  Genitourinary: Positive for frequency (increased  frequency of urination for past 3 nights.).    Objective  Vitals:   01/20/16 1536  BP: 138/82  Pulse: 95  Resp: 16  Temp: 98.2 F (36.8 C)  TempSrc: Oral  SpO2: 94%  Weight: 217 lb (98.4 kg)  Height: 5\' 9"  (1.753 m)    Physical Exam  Constitutional: She is well-developed, well-nourished, and in no distress.  HENT:  Head: Normocephalic and atraumatic.  Cardiovascular: Normal rate, S1 normal, S2 normal and normal heart sounds.  An irregular rhythm present.  Pulmonary/Chest: Effort normal and breath sounds normal. She has no wheezes.  Abdominal: Soft. Bowel sounds are hypoactive. There is tenderness in the right lower quadrant, suprapubic area and left lower quadrant. There is no guarding.  Psychiatric: Mood, memory, affect and judgment normal.  Nursing note and vitals reviewed.   Assessment & Plan  1. Needs flu shot  - Flu vaccine HIGH DOSE PF (Fluzone High dose)  2. Urinary frequency Shows moderate blood, no leukocytes. We'll send for analysis and culture - POCT urinalysis dipstick  3. Need for pneumococcal vaccination  - Pneumococcal conjugate vaccine 13-valent  4. Acute bilateral lower abdominal pain We'll rule out intra-abdominal and pelvic pathology with ultrasound of pelvis. - US Pelvis Complete; Future - US Transvaginal Non-OB; Future  5. Constipation, unspecified constipation type Advised to take MiraLAX daily as needed. - polyethylene glycol powder (GLYCOLAX/MIRALAX) powder; Take 17 g by mouth daily. Dissolve 1 capful in 8 oz of liquid daily PRN  Dispense: 3350 g; Refill: 1   Saxton Chain Asad A. [facility] 01/20/2016 3:43 PM

## 2016-01-22 LAB — URINE CULTURE

## 2016-01-24 ENCOUNTER — Other Ambulatory Visit: Payer: Medicare Other

## 2016-01-24 ENCOUNTER — Ambulatory Visit: Payer: Medicare Other

## 2016-01-25 ENCOUNTER — Ambulatory Visit: Payer: Medicare Other

## 2016-01-26 ENCOUNTER — Ambulatory Visit: Payer: Medicare Other | Admitting: Podiatry

## 2016-01-26 ENCOUNTER — Encounter: Payer: Self-pay | Admitting: Podiatry

## 2016-01-26 ENCOUNTER — Ambulatory Visit (INDEPENDENT_AMBULATORY_CARE_PROVIDER_SITE_OTHER): Payer: Medicare Other | Admitting: Podiatry

## 2016-01-26 DIAGNOSIS — Q828 Other specified congenital malformations of skin: Secondary | ICD-10-CM

## 2016-01-26 DIAGNOSIS — M2140 Flat foot [pes planus] (acquired), unspecified foot: Secondary | ICD-10-CM | POA: Diagnosis not present

## 2016-01-26 DIAGNOSIS — M722 Plantar fascial fibromatosis: Secondary | ICD-10-CM

## 2016-01-26 DIAGNOSIS — M76829 Posterior tibial tendinitis, unspecified leg: Secondary | ICD-10-CM

## 2016-01-27 NOTE — Progress Notes (Signed)
She presents today for a chief complaint of calluses to the forefoot left. She states that she has been developing them over the past several months this seems to be worsening lately. They are painful. She states that there are none on her right foot.  Objective: Vital signs are stable she is alert and oriented 3. Pulses are palpable. She has reactive hyperkeratosis plantar aspect of the forefoot left. No open lesions or wounds.  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  Plan: Debrided all reactive hyperkeratosis today placed padding and I also had her scan for a new set of orthotics which will help offload the forefoot.

## 2016-01-28 ENCOUNTER — Ambulatory Visit
Admission: RE | Admit: 2016-01-28 | Discharge: 2016-01-28 | Disposition: A | Discharge status: Home / Self Care | Payer: Medicare Other | Source: Ambulatory Visit | Attending: Family Medicine | Admitting: Family Medicine

## 2016-01-28 DIAGNOSIS — R1032 Left lower quadrant pain: Secondary | ICD-10-CM | POA: Diagnosis not present

## 2016-01-28 DIAGNOSIS — Z90710 Acquired absence of both cervix and uterus: Secondary | ICD-10-CM | POA: Insufficient documentation

## 2016-01-28 DIAGNOSIS — R1031 Right lower quadrant pain: Secondary | ICD-10-CM | POA: Diagnosis not present

## 2016-02-09 ENCOUNTER — Encounter: Payer: Self-pay | Admitting: Podiatry

## 2016-02-15 ENCOUNTER — Other Ambulatory Visit: Payer: Self-pay | Admitting: Family Medicine

## 2016-02-15 ENCOUNTER — Telehealth: Payer: Self-pay | Admitting: Family Medicine

## 2016-02-15 MED ORDER — METOPROLOL SUCCINATE ER 25 MG PO TB24: 25.0000 mg | ORAL_TABLET | Freq: Every day | ORAL | 0 refills | Status: DC

## 2016-02-15 MED ORDER — ALENDRONATE SODIUM 70 MG PO TABS: 70.0000 mg | ORAL_TABLET | ORAL | 0 refills | Status: DC

## 2016-02-15 NOTE — Telephone Encounter (Signed)
Prescription for alendronate and metoprolol is sent to pharmacy

## 2016-02-15 NOTE — Telephone Encounter (Signed)
Requesting refill on allendronate 70mg  tablets and metoprolol er 25mg . Asking that you send to [facility].

## 2016-02-16 NOTE — Telephone Encounter (Signed)
Left voice message informing patient.

## 2016-02-22 ENCOUNTER — Other Ambulatory Visit: Payer: Self-pay | Admitting: Family Medicine

## 2016-02-22 ENCOUNTER — Ambulatory Visit
Admission: RE | Admit: 2016-02-22 | Discharge: 2016-02-22 | Disposition: A | Discharge status: Home / Self Care | Payer: Medicare Other | Source: Ambulatory Visit | Attending: Family Medicine | Admitting: Family Medicine

## 2016-02-22 DIAGNOSIS — Z13820 Encounter for screening for osteoporosis: Secondary | ICD-10-CM | POA: Diagnosis present

## 2016-02-22 DIAGNOSIS — Z1231 Encounter for screening mammogram for malignant neoplasm of breast: Secondary | ICD-10-CM | POA: Insufficient documentation

## 2016-02-22 DIAGNOSIS — M8580 Other specified disorders of bone density and structure, unspecified site: Secondary | ICD-10-CM

## 2016-02-28 ENCOUNTER — Ambulatory Visit (INDEPENDENT_AMBULATORY_CARE_PROVIDER_SITE_OTHER): Payer: Medicare Other | Admitting: Podiatry

## 2016-02-28 ENCOUNTER — Encounter: Payer: Self-pay | Admitting: Podiatry

## 2016-02-28 DIAGNOSIS — M76822 Posterior tibial tendinitis, left leg: Secondary | ICD-10-CM

## 2016-02-28 DIAGNOSIS — M2140 Flat foot [pes planus] (acquired), unspecified foot: Secondary | ICD-10-CM

## 2016-02-28 DIAGNOSIS — M76829 Posterior tibial tendinitis, unspecified leg: Secondary | ICD-10-CM

## 2016-02-28 NOTE — Patient Instructions (Signed)

## 2016-02-29 NOTE — Progress Notes (Signed)
She presents today to pickup her new set of Orthotics. She tried the orthotics on stating that they felt that they did not have as much support in the arch as the previous orthotics. I encouraged her to try them and I will follow-up with her in 1 month.

## 2016-03-02 ENCOUNTER — Ambulatory Visit: Payer: Medicare Other | Admitting: Family Medicine

## 2016-03-06 ENCOUNTER — Telehealth: Payer: Self-pay | Admitting: Family Medicine

## 2016-03-06 NOTE — Telephone Encounter (Signed)
Pt is needing results of her Bone Density. She got the results of Mammo.

## 2016-03-14 ENCOUNTER — Ambulatory Visit: Payer: Medicare Other | Admitting: Family Medicine

## 2016-03-17 NOTE — Telephone Encounter (Signed)
Pt checking status on bone density report. States that she never received a call when she called 2 weeks ago.

## 2016-03-23 ENCOUNTER — Encounter: Payer: Self-pay | Admitting: Family Medicine

## 2016-03-23 ENCOUNTER — Ambulatory Visit (INDEPENDENT_AMBULATORY_CARE_PROVIDER_SITE_OTHER): Payer: Medicare Other | Admitting: Family Medicine

## 2016-03-23 VITALS — BP 124/76 | HR 82 | Temp 98.6°F | Resp 16 | Ht 69.0 in | Wt 213.7 lb

## 2016-03-23 DIAGNOSIS — B351 Tinea unguium: Secondary | ICD-10-CM | POA: Diagnosis not present

## 2016-03-23 DIAGNOSIS — I499 Cardiac arrhythmia, unspecified: Secondary | ICD-10-CM | POA: Diagnosis not present

## 2016-03-23 DIAGNOSIS — I10 Essential (primary) hypertension: Secondary | ICD-10-CM

## 2016-03-23 DIAGNOSIS — R109 Unspecified abdominal pain: Secondary | ICD-10-CM | POA: Diagnosis not present

## 2016-03-23 DIAGNOSIS — M85852 Other specified disorders of bone density and structure, left thigh: Secondary | ICD-10-CM | POA: Diagnosis not present

## 2016-03-23 HISTORY — DX: Tinea unguium: B35.1

## 2016-03-23 MED ORDER — METOPROLOL SUCCINATE ER 25 MG PO TB24: 25.0000 mg | ORAL_TABLET | Freq: Every day | ORAL | 0 refills | Status: DC

## 2016-03-23 MED ORDER — ALENDRONATE SODIUM 70 MG PO TABS: 70.0000 mg | ORAL_TABLET | ORAL | 3 refills | Status: DC

## 2016-03-23 NOTE — Progress Notes (Signed)
Name: Rebecca Anthony   MRN: [id number]    DOB: Jan 07, 1946   Date:03/23/2016       Progress Note  Subjective  Chief Complaint  Chief Complaint  Patient presents with  . Follow-up    HPI  Pt. Presents for follow up on lower abdomen and pelvic pain, present intermittently, worse at night when she is lying down but can happen during the day, the pain is described more as a soreness or sensitivity than a true pain, she feels that its coming from the inside of abdomen. She had a normal pelvic ultrasound 2 months ago.  Otherwise has no worrisome symptoms including blood in stool, unintentional weight loss, fatigue, nausea, vomiting, etc.  Asian is also concerned about thickening and change in her fingernail appearance, affecting fingers of both hands. No pain but there is yellowish discoloration of the distal parts of the fingernails, patient believes she may have an infection underneath the nailbed, she has not tried any medication or other remedies for the symptoms.  Past Medical History:  Diagnosis Date  . Dysuria   . Heart murmur   . Hematuria, unspecified   . History of shingles   . History of swelling of feet   . Hypertension   . Irritable bowel syndrome   . Lichenification and lichen simplex chronicus   . Localized superficial swelling, mass, or lump   . Osteoarthrosis, unspecified whether generalized or localized, unspecified site   . Osteoporosis, unspecified   . PAF (paroxysmal atrial fibrillation) ([place])    Diagnosed years ago -- originally on Quinidine  . Sinus problem   . Synovial cyst, unspecified     Past Surgical History:  Procedure Laterality Date  . ANKLE SURGERY Left   . APPENDECTOMY    . AV FISTULA REPAIR    . FOOT SURGERY Left   . VAGINAL HYSTERECTOMY    . VESICOVAGINAL FISTULA CLOSURE W/ TAH      Family History  Problem Relation Age of Onset  . Diabetes Mother   . Hypertension Mother   . Heart failure Mother   . High blood pressure Father   . Heart  disease Father   . Hypertension Father   . Breast cancer Neg Hx     Social History   Social History  . Marital status: Married    Spouse name: N/A  . Number of children: N/A  . Years of education: N/A   Occupational History  . Not on file.   Social History Main Topics  . Smoking status: Never Smoker  . Smokeless tobacco: Never Used  . Alcohol use No  . Drug use: No  . Sexual activity: Yes   Other Topics Concern  . Not on file   Social History Narrative   She is married. Recently moved from [facility] back to [place].   Never drinks alcohol. Has never smoked.   Minimal amount of caffeine intake.   She is active, but not with regular exercise.     Current Outpatient Prescriptions:  .  alendronate (FOSAMAX) 70 MG tablet, Take 1 tablet (70 mg total) by mouth once a week. Take with a full glass of water on an empty stomach., Disp: 12 tablet, Rfl: 0 .  aspirin 81 MG tablet, Take 1 tablet (81 mg total) by mouth daily., Disp: 30 tablet, Rfl:  .  calcium carbonate (OSCAL) 1500 (600 Ca) MG TABS tablet, Take by mouth 2 (two) times daily with a meal., Disp: , Rfl:  .  metoprolol succinate (TOPROL-XL)  25 MG 24 hr tablet, Take 1 tablet (25 mg total) by mouth daily., Disp: 90 tablet, Rfl: 0 .  omeprazole (PRILOSEC) 40 MG capsule, Take 40 mg by mouth as needed. , Disp: , Rfl:  .  polyethylene glycol powder (GLYCOLAX/MIRALAX) powder, Take 17 g by mouth daily. Dissolve 1 capful in 8 oz of liquid daily PRN, Disp: 3350 g, Rfl: 1 .  vitamin C (ASCORBIC ACID) 500 MG tablet, Take 500 mg by mouth daily., Disp: , Rfl:   Allergies  Allergen Reactions  . Sulfa Antibiotics   . Anaprox [Naproxen Sodium] Rash     ROS  Please see history of present illness for complete discussion of review of systems  Objective  Vitals:   03/23/16 1510  BP: 124/76  Pulse: 82  Resp: 16  Temp: 98.6 F (37 C)  TempSrc: Oral  SpO2: 97%  Weight: 213 lb 11.2 oz (96.9 kg)  Height: 5\' 9"  (1.753 m)    Physical Exam   Constitutional: She is oriented to person, place, and time and well-developed, well-nourished, and in no distress.  HENT:  Head: Normocephalic and atraumatic.  Cardiovascular: Normal rate, S1 normal and S2 normal.  A regularly irregular rhythm present.  No murmur heard. Pulmonary/Chest: Effort normal and breath sounds normal. She has no wheezes. She has no rhonchi.  Abdominal: Soft. Bowel sounds are normal. There is tenderness in the left lower quadrant.  Neurological: She is alert and oriented to person, place, and time.  Skin: No cyanosis. Nails show no clubbing.  Whitish discoloration at the distal nail edges, mildly thickened.   Psychiatric: Mood, memory, affect and judgment normal.  Nursing note and vitals reviewed.      Assessment & Plan  1. Osteopenia of left femoral neck Reviewed the recent DEXA scan, shows osteopenia, continue on alendronate. - alendronate (FOSAMAX) 70 MG tablet; Take 1 tablet (70 mg total) by mouth once a week. Take with a full glass of water on an empty stomach.  Dispense: 12 tablet; Refill: 3  2. Essential hypertension BP stable and controlled on present antihypertensive therapy - metoprolol succinate (TOPROL-XL) 25 MG 24 hr tablet; Take 1 tablet (25 mg total) by mouth daily.  Dispense: 90 tablet; Refill: 0  3. Abdominal pain in female Reviewed ultrasound of the pelvis, which is unremarkable. Based on patient's complaints of intermittent left lower quadrant abdominal pain and sometimes in the right lower quadrant, we will order a CT scan of abdomen and pelvis without contrast for evaluation. - CT Abdomen Pelvis Wo Contrast; Future  4. Onychomycosis Appears to be onychomycosis, however we'll send to dermatology to confirm infection and started on therapy. - Ambulatory referral to Dermatology  5. Irregular heart rhythm Had a detailed discussion with patient regarding the irregular heart rhythm, reviewed previous notes which the care that patient may  have paroxysmal atrial fibrillation. However she reports that she has always had this abnormality in the heart rhythm, recommended that she be referred to a different cardiologist for/workup from the start. Patient wants to wait and think about it at this time. She has no cardiopulmonary symptoms at this time. Will follow-up  Note; total face-to-face time 40 minutes, greater than 50% was spent in counseling and cognition of care with the patient. This included addressing multiple medical problems, reviewing previous history, pertinent cardiology notes, formulating a plan of care and discussion.   Serafin Decatur Asad A. [facility] 03/23/2016 3:32 PM

## 2016-03-29 ENCOUNTER — Other Ambulatory Visit: Payer: Self-pay

## 2016-03-29 ENCOUNTER — Ambulatory Visit: Payer: Medicare Other | Admitting: Podiatry

## 2016-03-29 DIAGNOSIS — G8929 Other chronic pain: Secondary | ICD-10-CM

## 2016-03-29 DIAGNOSIS — R1031 Right lower quadrant pain: Principal | ICD-10-CM

## 2016-04-12 ENCOUNTER — Ambulatory Visit: Payer: Medicare Other | Admitting: Podiatry

## 2016-04-14 ENCOUNTER — Other Ambulatory Visit
Admission: RE | Admit: 2016-04-14 | Discharge: 2016-04-14 | Disposition: A | Discharge status: Home / Self Care | Payer: Medicare Other | Attending: Family Medicine | Admitting: Family Medicine

## 2016-04-14 ENCOUNTER — Other Ambulatory Visit: Payer: Self-pay | Admitting: Family Medicine

## 2016-04-14 DIAGNOSIS — Z0000 Encounter for general adult medical examination without abnormal findings: Secondary | ICD-10-CM | POA: Diagnosis present

## 2016-04-14 LAB — LIPID PANEL
CHOLESTEROL: 237 mg/dL — AB (ref 0–200)
HDL: 76 mg/dL (ref >40)
LDL Cholesterol: 142 mg/dL — ABNORMAL HIGH (ref 0–99)
TRIGLYCERIDES: 93 mg/dL (ref <150)
Total CHOL/HDL Ratio: 3.1 RATIO
VLDL: 19 mg/dL (ref 0–40)

## 2016-04-14 LAB — COMPREHENSIVE METABOLIC PANEL
ALK PHOS: 69 U/L (ref 38–126)
ALT: 19 U/L (ref 14–54)
AST: 25 U/L (ref 15–41)
Albumin: 4.4 g/dL (ref 3.5–5.0)
Anion gap: 7 (ref 5–15)
BILIRUBIN TOTAL: 0.6 mg/dL (ref 0.3–1.2)
BUN: 19 mg/dL (ref 6–20)
CALCIUM: 9.5 mg/dL (ref 8.9–10.3)
CHLORIDE: 100 mmol/L — AB (ref 101–111)
CO2: 27 mmol/L (ref 22–32)
CREATININE: 0.71 mg/dL (ref 0.44–1.00)
Glucose, Bld: 88 mg/dL (ref 65–99)
Potassium: 3.9 mmol/L (ref 3.5–5.1)
Sodium: 134 mmol/L — ABNORMAL LOW (ref 135–145)
Total Protein: 7.3 g/dL (ref 6.5–8.1)

## 2016-04-14 LAB — TSH: TSH: 3.775 u[IU]/mL (ref 0.350–4.500)

## 2016-04-14 NOTE — Progress Notes (Signed)
Reordering of labs for annual physical exam conducted on 11/25/2015

## 2016-04-18 LAB — VITAMIN D 25 HYDROXY (VIT D DEFICIENCY, FRACTURES): VIT D 25 HYDROXY: 31.6 ng/mL (ref 30.0–100.0)

## 2016-04-19 ENCOUNTER — Ambulatory Visit
Admission: RE | Admit: 2016-04-19 | Discharge: 2016-04-19 | Disposition: A | Discharge status: Home / Self Care | Payer: Medicare Other | Source: Ambulatory Visit | Attending: Family Medicine | Admitting: Family Medicine

## 2016-04-19 DIAGNOSIS — Z9049 Acquired absence of other specified parts of digestive tract: Secondary | ICD-10-CM | POA: Diagnosis not present

## 2016-04-19 DIAGNOSIS — Z90710 Acquired absence of both cervix and uterus: Secondary | ICD-10-CM | POA: Diagnosis not present

## 2016-04-19 DIAGNOSIS — R1031 Right lower quadrant pain: Secondary | ICD-10-CM | POA: Diagnosis present

## 2016-04-19 DIAGNOSIS — I700 Atherosclerosis of aorta: Secondary | ICD-10-CM | POA: Insufficient documentation

## 2016-04-19 DIAGNOSIS — G8929 Other chronic pain: Secondary | ICD-10-CM

## 2016-04-19 DIAGNOSIS — M48061 Spinal stenosis, lumbar region without neurogenic claudication: Secondary | ICD-10-CM | POA: Diagnosis not present

## 2016-04-19 DIAGNOSIS — K5730 Diverticulosis of large intestine without perforation or abscess without bleeding: Secondary | ICD-10-CM | POA: Diagnosis not present

## 2016-04-19 DIAGNOSIS — M4186 Other forms of scoliosis, lumbar region: Secondary | ICD-10-CM | POA: Insufficient documentation

## 2016-04-19 MED ORDER — IOPAMIDOL (ISOVUE-300) INJECTION 61%
100.0000 mL | Freq: Once | INTRAVENOUS | Status: AC | PRN
  Administered 2016-04-19: 100 mL via INTRAVENOUS

## 2016-04-25 ENCOUNTER — Telehealth: Payer: Self-pay | Admitting: Family Medicine

## 2016-04-25 NOTE — Telephone Encounter (Signed)
Pt requesting return call pertaining to shingle vaccination. Pt received a bill in the mail for $355 but stated that it is only covered through [employer] where she would not have to pay anything. Need to discuss this

## 2016-05-08 NOTE — Telephone Encounter (Signed)
I have our [facility] specialist looking into this claim.  Will follow back up with patient once I receive information from the claim specialist.

## 2016-05-10 ENCOUNTER — Ambulatory Visit (INDEPENDENT_AMBULATORY_CARE_PROVIDER_SITE_OTHER): Payer: Medicare Other | Admitting: Podiatry

## 2016-05-10 ENCOUNTER — Encounter: Payer: Self-pay | Admitting: Podiatry

## 2016-05-10 DIAGNOSIS — M76829 Posterior tibial tendinitis, unspecified leg: Secondary | ICD-10-CM

## 2016-05-10 DIAGNOSIS — Q828 Other specified congenital malformations of skin: Secondary | ICD-10-CM | POA: Diagnosis not present

## 2016-05-10 DIAGNOSIS — M2140 Flat foot [pes planus] (acquired), unspecified foot: Secondary | ICD-10-CM

## 2016-05-10 DIAGNOSIS — M79676 Pain in unspecified toe(s): Secondary | ICD-10-CM

## 2016-05-10 DIAGNOSIS — B351 Tinea unguium: Secondary | ICD-10-CM

## 2016-05-10 NOTE — Progress Notes (Signed)
She presents today for a follow-up of her orthotics. She would also like to have her nails and calluses trimmed. She states the orthotics seem to be doing okay this seemed to be a little bit short and some tennis shoes but not in others.  Objective: Vital signs are stable alert and oriented 3. Pulses are palpable. Neurologic sensorium is intact. Plantar flex forefoot results and reactive hyperkeratotic lesions across the knuckles first through the fourth of the left foot. These appear to be less from previous evaluations. Her toenails are also thick yellow dystrophic with mycotic sharp incurvated nail margins and brittle.  Assessment: Porokeratosis and painful mycotic nails.  Plan: Debridement of all reactive hyperkeratotic tissue and nail tissue. Also placed padding to the plantar aspect of the forefoot left. I recommended she continue to use orthotics all the time and start strengthening exercises and water therapy.

## 2016-06-06 ENCOUNTER — Other Ambulatory Visit: Payer: Self-pay | Admitting: Family Medicine

## 2016-06-16 ENCOUNTER — Telehealth: Payer: Self-pay | Admitting: Family Medicine

## 2016-06-16 DIAGNOSIS — M85852 Other specified disorders of bone density and structure, left thigh: Secondary | ICD-10-CM

## 2016-06-16 NOTE — Telephone Encounter (Signed)
Patient wants know if she is suppose to continue taking Alendronate?  If so she is going to need a refill in order to take the medication on Monday.  Patient did state that she was notified of her bone density results but did know how long she needs to take alendronate. Patient's next appointment is on 07/05/16 @ 3pm.  Patient also wanted to know if she could start coming every 4 -6 months instead of 3?  Please advise.

## 2016-06-18 MED ORDER — ALENDRONATE SODIUM 70 MG PO TABS: 70.0000 mg | ORAL_TABLET | ORAL | 3 refills | Status: DC

## 2016-06-18 NOTE — Telephone Encounter (Signed)
Based on this patient's DEXA scan she has osteopenia at the left femur neck, she should continue on alendronate and repeat DEXA scan in November 2018. Refill for alendronate has been sent to her pharmacy

## 2016-06-21 ENCOUNTER — Ambulatory Visit: Payer: Medicare Other | Admitting: Family Medicine

## 2016-07-05 ENCOUNTER — Ambulatory Visit: Payer: Medicare Other | Admitting: Family Medicine

## 2016-07-19 ENCOUNTER — Ambulatory Visit: Payer: Medicare Other | Admitting: Family Medicine

## 2016-08-02 ENCOUNTER — Ambulatory Visit: Payer: Medicare Other | Admitting: Family Medicine

## 2016-08-14 ENCOUNTER — Ambulatory Visit: Payer: Medicare Other | Admitting: Family Medicine

## 2016-08-22 ENCOUNTER — Ambulatory Visit: Payer: Medicare Other | Admitting: Family Medicine

## 2016-08-30 ENCOUNTER — Ambulatory Visit: Payer: Medicare Other | Admitting: Family Medicine

## 2016-09-06 ENCOUNTER — Ambulatory Visit (INDEPENDENT_AMBULATORY_CARE_PROVIDER_SITE_OTHER): Payer: Medicare Other | Admitting: Family Medicine

## 2016-09-06 ENCOUNTER — Encounter: Payer: Self-pay | Admitting: Family Medicine

## 2016-09-06 VITALS — BP 122/78 | HR 87 | Temp 98.1°F | Resp 16 | Ht 69.0 in | Wt 217.4 lb

## 2016-09-06 DIAGNOSIS — E7800 Pure hypercholesterolemia, unspecified: Secondary | ICD-10-CM | POA: Diagnosis not present

## 2016-09-06 DIAGNOSIS — I10 Essential (primary) hypertension: Secondary | ICD-10-CM

## 2016-09-06 DIAGNOSIS — K5904 Chronic idiopathic constipation: Secondary | ICD-10-CM

## 2016-09-06 MED ORDER — METOPROLOL SUCCINATE ER 25 MG PO TB24: 25.0000 mg | ORAL_TABLET | Freq: Every day | ORAL | 0 refills | Status: DC

## 2016-09-06 NOTE — Progress Notes (Signed)
Name: Rebecca Anthony   MRN: [id number]    DOB: 01-07-1946   Date:09/06/2016       Progress Note  Subjective  Chief Complaint  Chief Complaint  Patient presents with  . Follow-up    3 mo  . Medication Refill    Constipation  This is a recurrent problem. The problem has been gradually improving since onset. Her stool frequency is 1 time per day. The patient is on a high fiber diet (drinking Prune juice, apple juice etc). She does not exercise regularly. There has been adequate water intake. Associated symptoms include abdominal pain (left lower abdominal pain), bloating (having a 'fullness' feeling) and hemorrhoids. Pertinent negatives include no fever, flatus or rectal pain. She has tried diet changes and fiber for the symptoms. There is no history of inflammatory bowel disease or irritable bowel syndrome.  Hypertension  This is a chronic problem. The problem is unchanged. The problem is controlled. Pertinent negatives include no blurred vision, chest pain, headaches, orthopnea or palpitations (has hx of p. Afib). Past treatments include beta blockers. There is no history of kidney disease, CAD/MI or CVA.  Hyperlipidemia  This is a chronic problem. The problem is uncontrolled. Recent lipid tests were reviewed and are high. Pertinent negatives include no chest pain. She is currently on no antihyperlipidemic treatment.    Past Medical History:  Diagnosis Date  . Dysuria   . Heart murmur   . Hematuria, unspecified   . History of shingles   . History of swelling of feet   . Hypertension   . Irritable bowel syndrome   . Lichenification and lichen simplex chronicus   . Localized superficial swelling, mass, or lump   . Osteoarthrosis, unspecified whether generalized or localized, unspecified site   . Osteoporosis, unspecified   . PAF (paroxysmal atrial fibrillation) ([place])    Diagnosed years ago -- originally on Quinidine  . Sinus problem   . Synovial cyst, unspecified     Past Surgical  History:  Procedure Laterality Date  . ANKLE SURGERY Left   . APPENDECTOMY    . AV FISTULA REPAIR    . FOOT SURGERY Left   . VAGINAL HYSTERECTOMY    . VESICOVAGINAL FISTULA CLOSURE W/ TAH      Family History  Problem Relation Age of Onset  . Diabetes Mother   . Hypertension Mother   . Heart failure Mother   . High blood pressure Father   . Heart disease Father   . Hypertension Father   . Breast cancer Neg Hx     Social History   Social History  . Marital status: Married    Spouse name: N/A  . Number of children: N/A  . Years of education: N/A   Occupational History  . Not on file.   Social History Main Topics  . Smoking status: Never Smoker  . Smokeless tobacco: Never Used  . Alcohol use No  . Drug use: No  . Sexual activity: Yes   Other Topics Concern  . Not on file   Social History Narrative   She is married. Recently moved from [facility] back to [place].   Never drinks alcohol. Has never smoked.   Minimal amount of caffeine intake.   She is active, but not with regular exercise.     Current Outpatient Prescriptions:  .  alendronate (FOSAMAX) 70 MG tablet, Take 1 tablet (70 mg total) by mouth once a week. Take with a full glass of water on an empty stomach., Disp:  12 tablet, Rfl: 3 .  aspirin 81 MG tablet, Take 1 tablet (81 mg total) by mouth daily., Disp: 30 tablet, Rfl:  .  calcium carbonate (OSCAL) 1500 (600 Ca) MG TABS tablet, Take by mouth 2 (two) times daily with a meal., Disp: , Rfl:  .  metoprolol succinate (TOPROL-XL) 25 MG 24 hr tablet, Take 1 tablet (25 mg total) by mouth daily., Disp: 90 tablet, Rfl: 0 .  metoprolol succinate (TOPROL-XL) 25 MG 24 hr tablet, TAKE 1 TABLET BY MOUTH DAILY, Disp: 90 tablet, Rfl: 0 .  omeprazole (PRILOSEC) 40 MG capsule, Take 40 mg by mouth as needed. , Disp: , Rfl:  .  polyethylene glycol powder (GLYCOLAX/MIRALAX) powder, Take 17 g by mouth daily. Dissolve 1 capful in 8 oz of liquid daily PRN, Disp: 3350 g, Rfl: 1 .  vitamin C  (ASCORBIC ACID) 500 MG tablet, Take 500 mg by mouth daily., Disp: , Rfl:   Allergies  Allergen Reactions  . Sulfa Antibiotics   . Anaprox [Naproxen Sodium] Rash     Review of Systems  Constitutional: Negative for fever.  Eyes: Negative for blurred vision.  Cardiovascular: Negative for chest pain, palpitations (has hx of p. Afib) and orthopnea.  Gastrointestinal: Positive for abdominal pain (left lower abdominal pain), bloating (having a 'fullness' feeling), constipation and hemorrhoids. Negative for flatus and rectal pain.  Neurological: Negative for headaches.    Objective  Vitals:   09/06/16 1553  BP: 122/78  Pulse: 87  Resp: 16  Temp: 98.1 F (36.7 C)  TempSrc: Oral  SpO2: 96%  Weight: 217 lb 6.4 oz (98.6 kg)  Height: 5\' 9"  (1.753 m)    Physical Exam  Constitutional: She is oriented to person, place, and time and well-developed, well-nourished, and in no distress.  HENT:  Head: Normocephalic and atraumatic.  Cardiovascular: Normal rate, regular rhythm and normal heart sounds.   No murmur heard. Pulmonary/Chest: Effort normal and breath sounds normal. She has no wheezes.  Abdominal: Soft. Bowel sounds are normal. There is tenderness (mild tenderness in left lower quadrant).  Neurological: She is alert and oriented to person, place, and time.  Psychiatric: Mood, memory, affect and judgment normal.  Nursing note and vitals reviewed.     Assessment & Plan  1. Essential hypertension BP stable on present anti- hypertensive therapy - metoprolol succinate (TOPROL-XL) 25 MG 24 hr tablet; Take 1 tablet (25 mg total) by mouth daily.  Dispense: 90 tablet; Refill: 0  2. Pure hypercholesterolemia Recheck FLP, consider starting on statin - Lipid panel  3. Chronic idiopathic constipation Advised to take MiraLAX every day,    Jaslyn Bansal [facility] 09/06/2016 4:04 PM

## 2016-09-07 ENCOUNTER — Telehealth: Payer: Self-pay | Admitting: Family Medicine

## 2016-09-07 NOTE — Telephone Encounter (Signed)
Was seen on yesterday and did not have a good night last night. The pharmacy does not have her prescription for fosamax. Also she is requesting return call from Dr Manuella Ghazi she has a few questions that she would like to ask and prefer to speak with her dr. [phone]

## 2016-09-07 NOTE — Telephone Encounter (Signed)
Returned call and the following issues were addressed  Prescription for Fosamax: She was given prescription for the full year for Fosamax in March 2018, she reports that she needs a refill and the pharmacy does not have it. According to our system, she should have enough refills and I asked asked her to confirm with her pharmacy.   Patient is concerned about the finding of diverticulosis on the CT scan, she was asking if she needs an antibiotic. I have explained that antibiotics are indicated for diverticulitis and NOT diverticulosis and that she does not have diverticulitis at this time and therefore the antibiotics are not indicated.  She needs to see the results of the DEXA scan from November 2017, dictated results are not available. Please contact the radiology department and ask them to send a dictated copy to [place] for review.

## 2016-09-08 ENCOUNTER — Telehealth: Payer: Self-pay | Admitting: Family Medicine

## 2016-09-08 DIAGNOSIS — R1032 Left lower quadrant pain: Secondary | ICD-10-CM

## 2016-09-08 MED ORDER — CIPROFLOXACIN HCL 500 MG PO TABS: 500.0000 mg | ORAL_TABLET | Freq: Two times a day (BID) | ORAL | 0 refills | Status: DC

## 2016-09-08 MED ORDER — METRONIDAZOLE 500 MG PO TABS: 500.0000 mg | ORAL_TABLET | Freq: Three times a day (TID) | ORAL | 0 refills | Status: AC

## 2016-09-08 NOTE — Telephone Encounter (Signed)
Patient is still experiencing left-sided abdominal pain and discomfort, she had diarrhea which is improved, she reports no fevers. She is concerned that she may be progressing to diverticulitis. I have asked her to take Tylenol for pain and will call in the 2 antibiotics ciprofloxacin and metronidazole in case she needs to take over the long weekend if her symptoms get worse. I advised her to be seen at an urgent care should she experience any worsening of symptoms or new onset of symptoms. Patient verbalized agreement

## 2016-09-08 NOTE — Telephone Encounter (Signed)
Pt called with some concerns about her stomach issues. She is still experiencing her symptoms of diarrhea and her stomach still aches. Please advise pt as to what she can do. Pt states she is not feeling well at all this morning and hopes for a call back before the long weekend.

## 2016-09-08 NOTE — Telephone Encounter (Signed)
Results from Dex scan has been reviewed with patient 3 times and by been reviewed with patient by provider in December 2017,I have mailed a printed copy of results to patient's address on file on 09/08/2016

## 2016-09-15 ENCOUNTER — Other Ambulatory Visit: Payer: Self-pay | Admitting: Family Medicine

## 2016-09-15 DIAGNOSIS — R1032 Left lower quadrant pain: Secondary | ICD-10-CM

## 2016-09-25 ENCOUNTER — Telehealth: Payer: Self-pay | Admitting: Podiatry

## 2016-09-25 NOTE — Telephone Encounter (Signed)
Pt left voicemail requarding her orthotics she states they right one is falling apart. Is this normal. She got them November of last year

## 2016-10-04 ENCOUNTER — Ambulatory Visit: Payer: Medicare Other

## 2016-10-09 ENCOUNTER — Ambulatory Visit: Payer: Medicare Other | Admitting: Orthotics

## 2016-10-11 NOTE — Telephone Encounter (Signed)
Can you call patient and have her come in and let me see them...might be a simple fix.Marland KitchenMarland Kitchen

## 2016-10-12 ENCOUNTER — Ambulatory Visit: Payer: Medicare Other | Admitting: Orthotics

## 2016-10-19 ENCOUNTER — Encounter: Payer: Self-pay | Admitting: Gastroenterology

## 2016-10-19 ENCOUNTER — Ambulatory Visit (INDEPENDENT_AMBULATORY_CARE_PROVIDER_SITE_OTHER): Payer: Medicare Other | Admitting: Gastroenterology

## 2016-10-19 VITALS — BP 138/90 | HR 57 | Temp 98.1°F | Ht 69.0 in | Wt 217.0 lb

## 2016-10-19 DIAGNOSIS — R109 Unspecified abdominal pain: Secondary | ICD-10-CM

## 2016-10-19 DIAGNOSIS — K5900 Constipation, unspecified: Secondary | ICD-10-CM | POA: Diagnosis not present

## 2016-10-19 DIAGNOSIS — R140 Abdominal distension (gaseous): Secondary | ICD-10-CM | POA: Diagnosis not present

## 2016-10-19 NOTE — Progress Notes (Signed)
Jonathon Bellows MD, MRCP(U.K) [address]  [place]  [place], [place] [address]  Main: [phone]  Fax: [phone]   Gastroenterology Consultation  Referring Provider:     Roselee Nova, MD Primary Care Physician:  Roselee Nova, MD Primary Gastroenterologist:  Dr. Jonathon Bellows  Reason for Consultation:     Abdominal pain         HPI:   Rebecca Anthony is a 71 y.o. y/o female referred for consultation & management  by Dr. Manuella Ghazi, Talbert [place], MD.    She has been referred for abdominal pain . She does carry a diagnosis of constipation in the past. CT abdomen in 04/2016 shows sigmoid diverticulosis , scoliosis.  She says she has had bloating in her left side of her abdomen , on and off for about two months, since has gotten better, was treated with antibiotics recently for suspected diverticulitis. Subsequently it made her feel better. She says that she was told she was constipated , given miralax, which helped, stopped it subsequently , tried apple juice, pineapple juice which she says the bloating helped.   Prresently has some LLQ tenderness and some over her bladder for about 2 months. At times has pain when she does not apply pressure in the area, At times when she wakes up feels like a "catch in her left side"better when she stretches up .   Presently she has a bowel movement daily , She does feel better when she has a good bowel movement , with less tenderness, bloating .   She has had a colonoscopy 2 years back at this hospital and was told she may have had a polyp. Denies any weight loss.   She consumes ?stevia , sweet n low ,no diet sodas or crystalite.   She consumes sugar free gum 3 sticks a day .   Past Medical History:  Diagnosis Date  . Dysuria   . Heart murmur   . Hematuria, unspecified   . History of shingles   . History of swelling of feet   . Hypertension   . Irritable bowel syndrome   . Lichenification and lichen simplex chronicus   . Localized  superficial swelling, mass, or lump   . Osteoarthrosis, unspecified whether generalized or localized, unspecified site   . Osteoporosis, unspecified   . PAF (paroxysmal atrial fibrillation) ([place])    Diagnosed years ago -- originally on Quinidine  . Sinus problem   . Synovial cyst, unspecified     Past Surgical History:  Procedure Laterality Date  . ANKLE SURGERY Left   . APPENDECTOMY    . AV FISTULA REPAIR    . FOOT SURGERY Left   . VAGINAL HYSTERECTOMY    . VESICOVAGINAL FISTULA CLOSURE W/ TAH      Prior to Admission medications   Medication Sig Start Date End Date Taking? Authorizing Provider  alendronate (FOSAMAX) 70 MG tablet Take 1 tablet (70 mg total) by mouth once a week. Take with a full glass of water on an empty stomach. 06/18/16   Roselee Nova, MD  aspirin 81 MG tablet Take 1 tablet (81 mg total) by mouth daily. 08/27/13   Leonie Man, MD  calcium carbonate (OSCAL) 1500 (600 Ca) MG TABS tablet Take by mouth 2 (two) times daily with a meal.    [provider]  ciprofloxacin (CIPRO) 500 MG tablet Take 1 tablet (500 mg total) by mouth 2 (two) times daily. 09/08/16  Roselee Nova, MD  metoprolol succinate (TOPROL-XL) 25 MG 24 hr tablet TAKE 1 TABLET BY MOUTH DAILY 06/06/16   Roselee Nova, MD  metoprolol succinate (TOPROL-XL) 25 MG 24 hr tablet Take 1 tablet (25 mg total) by mouth daily. 09/06/16   Roselee Nova, MD  omeprazole (PRILOSEC) 40 MG capsule Take 40 mg by mouth as needed.  01/31/13   [provider]  polyethylene glycol powder (GLYCOLAX/MIRALAX) powder Take 17 g by mouth daily. Dissolve 1 capful in 8 oz of liquid daily PRN 01/20/16   Roselee Nova, MD  vitamin C (ASCORBIC ACID) 500 MG tablet Take 500 mg by mouth daily.    [provider]    Family History  Problem Relation Age of Onset  . Diabetes Mother   . Hypertension Mother   . Heart failure Mother   . High blood pressure Father   . Heart disease Father   .  Hypertension Father   . Breast cancer Neg Hx      Social History  Substance Use Topics  . Smoking status: Never Smoker  . Smokeless tobacco: Never Used  . Alcohol use No    Allergies as of 10/19/2016 - Review Complete 09/06/2016  Allergen Reaction Noted  . Sulfa antibiotics  08/26/2013  . Anaprox [naproxen sodium] Rash 03/18/2013    Review of Systems:    All systems reviewed and negative except where noted in HPI.   Physical Exam:  There were no vitals taken for this visit. No LMP recorded. Patient has had a hysterectomy. Psych:  Alert and cooperative. Normal mood and affect. General:   Alert,  Well-developed, well-nourished, pleasant and cooperative in NAD Head:  Normocephalic and atraumatic. Eyes:  Sclera clear, no icterus.   Conjunctiva pink. Ears:  Normal auditory acuity. Nose:  No deformity, discharge, or lesions. Mouth:  No deformity or lesions,oropharynx pink & moist. Neck:  Supple; no masses or thyromegaly. Lungs:  Respirations even and unlabored.  Clear throughout to auscultation.   No wheezes, crackles, or rhonchi. No acute distress. Heart:  Regular rate and rhythm; no murmurs, clicks, rubs, or gallops. Abdomen:  Normal bowel sounds.  No bruits.  Soft, non-tender and non-distended without masses, hepatosplenomegaly or hernias noted.  No guarding or rebound tenderness.    Pulses:  Normal pulses noted. Extremities:  No clubbing or edema.  No cyanosis. Neurologic:  Alert and oriented x3;  grossly normal neurologically. Psych:  Alert and cooperative. Normal mood and affect.  Imaging Studies: No results found.  Assessment and Plan:   Rebecca Anthony is a 71 y.o. y/o female has been referred for abdominal pain.Likely pain is a combination of bloating from use of artificial sugars in diet +/- constipation .  Plan : 1. Stop artificial sweeteners, chewing gum , sodas as the artificial sugars can contribute to bloating .  2. Miralax daily for 1 good bowel movement  3.  Get old colonoscopy report.    Follow up in 8 weeks   Dr Jonathon Bellows MD,MRCP(U.K)

## 2016-10-20 ENCOUNTER — Telehealth: Payer: Self-pay

## 2016-10-20 ENCOUNTER — Other Ambulatory Visit: Payer: Self-pay

## 2016-10-20 NOTE — Telephone Encounter (Signed)
Patient called to ask if it is possible that the pain on the left side could be coming from her pancreas. Should any labs be done to check for pancreatic issues?  If so she said she is due in a few weeks for labs with Dr. Manuella Ghazi and will ask him to check.  Please advise.

## 2016-10-23 NOTE — Telephone Encounter (Signed)
Inform   Last CT scan in 04/2016- showed no issues with pancreas. At this time I would still suggest plan as per last note. There is no "specific test for pancreas" . If pain persists depspite everything may need to repeat CT scan to re look at all the organs in the abdomen . Would suggest LFT's at next visit with PCP

## 2016-10-24 ENCOUNTER — Ambulatory Visit: Payer: Medicare Other | Admitting: Gastroenterology

## 2016-10-24 ENCOUNTER — Telehealth: Payer: Self-pay | Admitting: Family Medicine

## 2016-10-24 ENCOUNTER — Telehealth: Payer: Self-pay

## 2016-10-24 NOTE — Telephone Encounter (Signed)
Pt saw GI doctor still having issues. Pt would like to know if dr Manuella Ghazi could do an addendum to the labs that he has already ordered. She would like to check her pancreatic enzyme and liver function. The patient mentioned this to Dr Vicente Males but Dr Vicente Males suggested that it be ordered by her pcp.

## 2016-10-24 NOTE — Telephone Encounter (Signed)
Pt has been informed that her 01/18 CT Scan showed no pancreas issues. If pain persisted despite everything we may need to repeat CT scan to look at all abdomen organs.  Pt stated that she will ask her PCP to order LFT with next lab workup.

## 2016-10-25 ENCOUNTER — Telehealth: Payer: Self-pay

## 2016-10-25 NOTE — Telephone Encounter (Signed)
Patient called back to let us know :  1. Stomach is still hurting 2. Sore in some spots more than before 3. Fiber Choice has been helping her stay more regular 4. Experiencing tenderness on the left side-not going away 5. Feels better when she gets up and moves around 6.  Will stop fiber choice to see if that helps with bloating 7. Plans to start Miralax to maintain regularity. 8. Experiencing frequent urination (Has been asked to see if Dr. Manuella Ghazi can check in office for UTI) 9. Urine has foul smell.  Thank you.

## 2016-10-26 LAB — COLOGUARD
COLOGUARD: NEGATIVE
Cologuard: NEGATIVE

## 2016-10-26 NOTE — Telephone Encounter (Signed)
Please schedule patient for an appointment to evaluate her symptoms and order appropriate diagnostic testing if indicated.

## 2016-10-26 NOTE — Telephone Encounter (Signed)
Left voice message on both numbers informing pt to scheduled appt

## 2016-10-26 NOTE — Telephone Encounter (Signed)
Dr. Manuella Ghazi, Pt saw GI doctor still having issues. Pt would like to know if dr Manuella Ghazi could do an addendum to the labs that he has already ordered. She would like to check her pancreatic enzyme and liver function. The patient mentioned this to Dr Vicente Males but Dr Vicente Males suggested that it be ordered by her pcp.

## 2016-10-27 ENCOUNTER — Ambulatory Visit (INDEPENDENT_AMBULATORY_CARE_PROVIDER_SITE_OTHER): Payer: Medicare Other | Admitting: Family Medicine

## 2016-10-27 ENCOUNTER — Encounter: Payer: Self-pay | Admitting: Family Medicine

## 2016-10-27 VITALS — BP 128/88 | HR 93 | Temp 98.0°F | Resp 16 | Ht 69.0 in | Wt 216.3 lb

## 2016-10-27 DIAGNOSIS — R1032 Left lower quadrant pain: Secondary | ICD-10-CM | POA: Diagnosis not present

## 2016-10-27 DIAGNOSIS — R350 Frequency of micturition: Secondary | ICD-10-CM

## 2016-10-27 LAB — POCT URINALYSIS DIPSTICK
Bilirubin, UA: NEGATIVE
GLUCOSE UA: NEGATIVE
Ketones, UA: NEGATIVE
Leukocytes, UA: NEGATIVE
NITRITE UA: NEGATIVE
Protein, UA: NEGATIVE
RBC UA: NEGATIVE
Spec Grav, UA: 1.015 (ref 1.010–1.025)
UROBILINOGEN UA: 0.2 U/dL
pH, UA: 6 (ref 5.0–8.0)

## 2016-10-27 NOTE — Progress Notes (Signed)
Name: Rebecca Anthony   MRN: [id number]    DOB: 1946/02/25   Date:10/27/2016       Progress Note  Subjective  Chief Complaint  Chief Complaint  Patient presents with  . Abdominal Pain    Onset- couple of months, intermittently, Lower Left side pain and frequently. States she is having a little relief with bowel movement and using probiotics.     Abdominal Pain  This is a recurrent problem. The onset quality is gradual. The problem has been unchanged. The pain is located in the LLQ. The quality of the pain is aching. The abdominal pain radiates to the LLQ (tenderness in a specific region in LLQ.). Associated symptoms include frequency (increased urinary frequency, nocturia.). Pertinent negatives include no anorexia, constipation (takes Miralax for constipation.), diarrhea, dysuria, fever, flatus, hematochezia, hematuria, nausea or vomiting. She has tried antibiotics for the symptoms. Prior diagnostic workup includes CT scan and ultrasound.     Past Medical History:  Diagnosis Date  . Dysuria   . Heart murmur   . Hematuria, unspecified   . History of shingles   . History of swelling of feet   . Hypertension   . Irritable bowel syndrome   . Lichenification and lichen simplex chronicus   . Localized superficial swelling, mass, or lump   . Osteoarthrosis, unspecified whether generalized or localized, unspecified site   . Osteoporosis, unspecified   . PAF (paroxysmal atrial fibrillation) ([place])    Diagnosed years ago -- originally on Quinidine  . Sinus problem   . Synovial cyst, unspecified     Past Surgical History:  Procedure Laterality Date  . ANKLE SURGERY Left   . APPENDECTOMY    . AV FISTULA REPAIR    . FOOT SURGERY Left   . VAGINAL HYSTERECTOMY    . VESICOVAGINAL FISTULA CLOSURE W/ TAH      Family History  Problem Relation Age of Onset  . Diabetes Mother   . Hypertension Mother   . Heart failure Mother   . High blood pressure Father   . Heart disease Father   .  Hypertension Father   . Breast cancer Neg Hx     Social History   Social History  . Marital status: Married    Spouse name: N/A  . Number of children: N/A  . Years of education: N/A   Occupational History  . Not on file.   Social History Main Topics  . Smoking status: Never Smoker  . Smokeless tobacco: Never Used  . Alcohol use No  . Drug use: No  . Sexual activity: Yes   Other Topics Concern  . Not on file   Social History Narrative   She is married. Recently moved from [facility] back to [place].   Never drinks alcohol. Has never smoked.   Minimal amount of caffeine intake.   She is active, but not with regular exercise.     Current Outpatient Prescriptions:  .  alendronate (FOSAMAX) 70 MG tablet, Take 1 tablet (70 mg total) by mouth once a week. Take with a full glass of water on an empty stomach., Disp: 12 tablet, Rfl: 3 .  aspirin 81 MG tablet, Take 1 tablet (81 mg total) by mouth daily., Disp: 30 tablet, Rfl:  .  calcium carbonate (OSCAL) 1500 (600 Ca) MG TABS tablet, Take by mouth 2 (two) times daily with a meal., Disp: , Rfl:  .  metoprolol succinate (TOPROL-XL) 25 MG 24 hr tablet, TAKE 1 TABLET BY MOUTH DAILY, Disp: 90  tablet, Rfl: 0 .  omeprazole (PRILOSEC) 40 MG capsule, Take 40 mg by mouth as needed. , Disp: , Rfl:  .  polyethylene glycol powder (GLYCOLAX/MIRALAX) powder, Take 17 g by mouth daily. Dissolve 1 capful in 8 oz of liquid daily PRN, Disp: 3350 g, Rfl: 1 .  vitamin C (ASCORBIC ACID) 500 MG tablet, Take 500 mg by mouth daily., Disp: , Rfl:   Allergies  Allergen Reactions  . Sulfa Antibiotics   . Anaprox [Naproxen Sodium] Rash     Review of Systems  Constitutional: Negative for fever.  Gastrointestinal: Positive for abdominal pain. Negative for anorexia, constipation (takes Miralax for constipation.), diarrhea, flatus, hematochezia, nausea and vomiting.  Genitourinary: Positive for frequency (increased urinary frequency, nocturia.). Negative for dysuria and  hematuria.     Objective  Vitals:   10/27/16 1429  BP: 128/88  Pulse: 93  Resp: 16  Temp: 98 F (36.7 C)  TempSrc: Oral  SpO2: 97%  Weight: 216 lb 4.8 oz (98.1 kg)  Height: 5\' 9"  (1.753 m)    Physical Exam  Constitutional: She is oriented to person, place, and time and well-developed, well-nourished, and in no distress.  Cardiovascular: Normal rate, regular rhythm and normal heart sounds.   No murmur heard. Pulmonary/Chest: Effort normal and breath sounds normal. She has no wheezes.  Abdominal: Soft. Bowel sounds are normal. There is tenderness in the left lower quadrant.    Neurological: She is alert and oriented to person, place, and time.  Nursing note and vitals reviewed.    Assessment & Plan  1. Urinary frequency Urinalysis is unremarkable, based on symptoms and presentation, we will order a renal ultrasound for evaluation - POCT urinalysis dipstick - US Renal; Future  2. LLQ abdominal pain Patient wishes to be tested for pancreatic disease, her pancreas was normal on the CT scan abdomen and pelvis in January 2018. We will order amylase and lipase. - US Renal; Future - Amylase - Lipase   Peg Fifer Asad A[facility] 10/27/2016 2:57 PM

## 2016-10-27 NOTE — Telephone Encounter (Signed)
appt made for today 

## 2016-10-30 ENCOUNTER — Telehealth: Payer: Self-pay

## 2016-10-30 NOTE — Telephone Encounter (Signed)
Patient was informed via voicemail that she has been scheduled to have her [place] on Friday, November 03, 2016 at [place] [facility]. Patient was instructed to arrive 15 mins early and to give scheduling a call at [phone], if she cannot make that day or time.

## 2016-10-31 ENCOUNTER — Telehealth: Payer: Self-pay | Admitting: Family Medicine

## 2016-10-31 NOTE — Telephone Encounter (Signed)
Pt called checking status on her urine lab results, would like to know if any blood was detected. It is okay to leave message on message on machine

## 2016-11-01 ENCOUNTER — Ambulatory Visit: Payer: Medicare Other

## 2016-11-02 DIAGNOSIS — M722 Plantar fascial fibromatosis: Secondary | ICD-10-CM

## 2016-11-02 NOTE — Telephone Encounter (Signed)
Patient has been notified of urine lab results

## 2016-11-03 ENCOUNTER — Ambulatory Visit: Payer: Medicare Other

## 2016-11-03 ENCOUNTER — Ambulatory Visit
Admission: RE | Admit: 2016-11-03 | Discharge: 2016-11-03 | Disposition: A | Discharge status: Home / Self Care | Payer: Medicare Other | Source: Ambulatory Visit | Attending: Family Medicine | Admitting: Family Medicine

## 2016-11-03 DIAGNOSIS — R1032 Left lower quadrant pain: Secondary | ICD-10-CM | POA: Insufficient documentation

## 2016-11-03 DIAGNOSIS — R350 Frequency of micturition: Secondary | ICD-10-CM

## 2016-11-08 ENCOUNTER — Telehealth: Payer: Self-pay | Admitting: Family Medicine

## 2016-11-08 NOTE — Telephone Encounter (Signed)
PT IS ASKING FOR XRAY RESUILTS SHE HAD OVER A WEEK AGO

## 2016-11-08 NOTE — Telephone Encounter (Signed)
Patient has been notified of renal ultrasound results

## 2016-11-20 ENCOUNTER — Telehealth: Payer: Self-pay | Admitting: Orthotics

## 2016-11-21 ENCOUNTER — Encounter: Payer: Self-pay | Admitting: Family Medicine

## 2016-12-06 ENCOUNTER — Ambulatory Visit: Payer: Medicare Other

## 2016-12-07 ENCOUNTER — Encounter: Payer: Medicare Other | Admitting: Family Medicine

## 2016-12-10 ENCOUNTER — Other Ambulatory Visit: Payer: Self-pay | Admitting: Family Medicine

## 2016-12-10 DIAGNOSIS — I10 Essential (primary) hypertension: Secondary | ICD-10-CM

## 2016-12-14 ENCOUNTER — Other Ambulatory Visit: Payer: Self-pay | Admitting: Otolaryngology

## 2016-12-14 DIAGNOSIS — E041 Nontoxic single thyroid nodule: Secondary | ICD-10-CM

## 2016-12-15 ENCOUNTER — Telehealth: Payer: Self-pay | Admitting: Family Medicine

## 2016-12-15 NOTE — Telephone Encounter (Signed)
Pt requesting a return call. She would like a copy of her last thyroid results. She would like for you to mail them to her.

## 2016-12-19 ENCOUNTER — Encounter: Payer: Self-pay | Admitting: Family Medicine

## 2016-12-19 NOTE — Telephone Encounter (Signed)
Lab results have been printed and waiting for patient pick up at front desk

## 2016-12-20 ENCOUNTER — Ambulatory Visit: Payer: Medicare Other

## 2016-12-27 ENCOUNTER — Ambulatory Visit: Payer: Medicare Other | Admitting: Podiatry

## 2017-01-01 ENCOUNTER — Encounter: Payer: Medicare Other | Admitting: Family Medicine

## 2017-01-04 ENCOUNTER — Encounter: Payer: Medicare Other | Admitting: Family Medicine

## 2017-01-05 ENCOUNTER — Ambulatory Visit: Payer: Medicare Other

## 2017-01-09 ENCOUNTER — Ambulatory Visit: Payer: Medicare Other | Admitting: Family Medicine

## 2017-01-10 ENCOUNTER — Ambulatory Visit: Payer: Medicare Other | Admitting: Family Medicine

## 2017-01-10 ENCOUNTER — Ambulatory Visit: Payer: Medicare Other | Admitting: Podiatry

## 2017-01-12 ENCOUNTER — Ambulatory Visit: Payer: Medicare Other

## 2017-01-15 ENCOUNTER — Ambulatory Visit: Payer: Medicare Other | Admitting: Podiatry

## 2017-01-16 ENCOUNTER — Ambulatory Visit (INDEPENDENT_AMBULATORY_CARE_PROVIDER_SITE_OTHER): Payer: Medicare Other | Admitting: Family Medicine

## 2017-01-16 ENCOUNTER — Other Ambulatory Visit: Payer: Self-pay | Admitting: Family Medicine

## 2017-01-16 ENCOUNTER — Encounter: Payer: Self-pay | Admitting: Family Medicine

## 2017-01-16 VITALS — BP 136/82 | HR 111 | Temp 98.2°F | Ht 69.0 in | Wt 218.0 lb

## 2017-01-16 DIAGNOSIS — I499 Cardiac arrhythmia, unspecified: Secondary | ICD-10-CM

## 2017-01-16 DIAGNOSIS — R1032 Left lower quadrant pain: Secondary | ICD-10-CM

## 2017-01-16 DIAGNOSIS — E041 Nontoxic single thyroid nodule: Secondary | ICD-10-CM

## 2017-01-16 MED ORDER — METOPROLOL SUCCINATE ER 50 MG PO TB24: ORAL_TABLET | ORAL | 0 refills | Status: DC

## 2017-01-16 NOTE — Progress Notes (Signed)
Name: Rebecca Anthony   MRN: [id number]    DOB: [birth year]   Date:01/16/2017       Progress Note  Subjective  Chief Complaint  Chief Complaint  Patient presents with  . GI Problem  . Thyroid Problem    HPI  Patient presents for follow-up of left lower quadrant abdominal pain, it has been intermittent but recurrent, no associated symptoms such as hematochezia, nausea, vomiting etc. Lab work and imaging evaluations have been unremarkable except for diverticulosis but not diverticulitis. Her diet is appropriate, has been using MiraLAX for regular bowel movements  She'll also been told to have a thyroid ultrasound for evaluation of a possible mass in the right side of her thyroid by a specialist provider.  Past Medical History:  Diagnosis Date  . Dysuria   . Heart murmur   . Hematuria, unspecified   . History of shingles   . History of swelling of feet   . Hypertension   . Irritable bowel syndrome   . Lichenification and lichen simplex chronicus   . Localized superficial swelling, mass, or lump   . Osteoarthrosis, unspecified whether generalized or localized, unspecified site   . Osteoporosis, unspecified   . PAF (paroxysmal atrial fibrillation) ([place])    Diagnosed years ago -- originally on Quinidine  . Sinus problem   . Synovial cyst, unspecified     Past Surgical History:  Procedure Laterality Date  . ANKLE SURGERY Left   . APPENDECTOMY    . AV FISTULA REPAIR    . FOOT SURGERY Left   . VAGINAL HYSTERECTOMY    . VESICOVAGINAL FISTULA CLOSURE W/ TAH      Family History  Problem Relation Age of Onset  . Diabetes Mother   . Hypertension Mother   . Heart failure Mother   . High blood pressure Father   . Heart disease Father   . Hypertension Father   . Breast cancer Neg Hx     Social History   Social History  . Marital status: Married    Spouse name: N/A  . Number of children: N/A  . Years of education: N/A   Occupational History  . Not on file.   Social  History Main Topics  . Smoking status: Never Smoker  . Smokeless tobacco: Never Used  . Alcohol use No  . Drug use: No  . Sexual activity: Yes    Birth control/ protection: None, Surgical   Other Topics Concern  . Not on file   Social History Narrative   She is married. Recently moved from [facility] back to [place].   Never drinks alcohol. Has never smoked.   Minimal amount of caffeine intake.   She is active, but not with regular exercise.     Current Outpatient Prescriptions:  .  alendronate (FOSAMAX) 70 MG tablet, Take 1 tablet (70 mg total) by mouth once a week. Take with a full glass of water on an empty stomach., Disp: 12 tablet, Rfl: 3 .  aspirin 81 MG tablet, Take 1 tablet (81 mg total) by mouth daily., Disp: 30 tablet, Rfl:  .  calcium carbonate (OSCAL) 1500 (600 Ca) MG TABS tablet, Take by mouth 2 (two) times daily with a meal., Disp: , Rfl:  .  metoprolol succinate (TOPROL-XL) 25 MG 24 hr tablet, TK 1 T PO D, Disp: , Rfl: 0 .  omeprazole (PRILOSEC) 40 MG capsule, Take 40 mg by mouth as needed. , Disp: , Rfl:  .  polyethylene glycol powder (GLYCOLAX/MIRALAX)  powder, Take 17 g by mouth daily. Dissolve 1 capful in 8 oz of liquid daily PRN, Disp: 3350 g, Rfl: 1 .  vitamin C (ASCORBIC ACID) 500 MG tablet, Take 500 mg by mouth daily., Disp: , Rfl:   Allergies  Allergen Reactions  . Sulfa Antibiotics   . Anaprox [Naproxen Sodium] Rash     ROS  Please see history of present illness for complete discussion of ROS  Objective  Vitals:   01/16/17 1511  BP: 136/82  Pulse: (!) 111  Temp: 98.2 F (36.8 C)  TempSrc: Oral  Weight: 218 lb (98.9 kg)  Height: 5\' 9"  (1.753 m)    Physical Exam  Constitutional: She is oriented to person, place, and time and well-developed, well-nourished, and in no distress.  Neck: Trachea normal. Neck supple.  Cardiovascular: S1 normal, S2 normal and normal heart sounds.  An irregular rhythm present. Tachycardia present.   No murmur  heard. Pulmonary/Chest: Effort normal and breath sounds normal. She has no wheezes. She has no rhonchi.  Abdominal: Soft. Bowel sounds are normal. There is tenderness in the left lower quadrant. There is no rigidity.    Neurological: She is alert and oriented to person, place, and time.  Nursing note and vitals reviewed.     Recent Results (from the past 2160 hour(s))  Cologuard     Status: None   Collection Time: 10/26/16 12:00 AM  Result Value Ref Range   Cologuard Negative   Cologuard     Status: None   Collection Time: 10/26/16 12:00 AM  Result Value Ref Range   Cologuard Negative     Comment: [employer] Fecal Immunochemical   POCT urinalysis dipstick     Status: Normal   Collection Time: 10/27/16  3:30 PM  Result Value Ref Range   Color, UA light yelllow    Clarity, UA clear    Glucose, UA neg    Bilirubin, UA neg    Ketones, UA neg    Spec Grav, UA 1.015 1.010 - 1.025   Blood, UA neg    pH, UA 6.0 5.0 - 8.0   Protein, UA neg    Urobilinogen, UA 0.2 0.2 or 1.0 E.U./dL   Nitrite, UA neg    Leukocytes, UA Negative Negative     Assessment & Plan  1. Irregular heart rhythm Likely atrial fibrillation, patient has been diagnosed with the same in the past, have been advised to wear a Holter monitor and have additional follow-up but she has refused. We'll increase metoprolol to 30 mg for tachycardia, advised to follow up with cardiology. - metoprolol succinate (TOPROL-XL) 50 MG 24 hr tablet; TK 1 T PO D  Dispense: 30 tablet; Refill: 0  2. Thyroid nodule  neck is supple on palpation, thyroid ultrasound has been ordered for evaluation   3. LLQ abdominal pain Again reassured that her abdominal pain is likely functional in nature, a past imaging has been unremarkable. However, because of the persistent and recurrent nature of her pain, would like to order MRI of abdomen pelvis for evaluation, patient verbalized agreement and will follow-up in one week.   Chauncy Mangiaracina Asad A.  [facility] 01/16/2017 3:26 PM

## 2017-01-17 ENCOUNTER — Ambulatory Visit: Payer: Medicare Other | Admitting: Podiatry

## 2017-01-18 ENCOUNTER — Ambulatory Visit
Admission: RE | Admit: 2017-01-18 | Discharge: 2017-01-18 | Disposition: A | Discharge status: Home / Self Care | Payer: Medicare Other | Source: Ambulatory Visit | Attending: Otolaryngology | Admitting: Otolaryngology

## 2017-01-18 DIAGNOSIS — E049 Nontoxic goiter, unspecified: Secondary | ICD-10-CM | POA: Diagnosis not present

## 2017-01-18 DIAGNOSIS — E041 Nontoxic single thyroid nodule: Secondary | ICD-10-CM

## 2017-01-23 ENCOUNTER — Ambulatory Visit: Payer: Medicare Other | Admitting: Family Medicine

## 2017-01-24 ENCOUNTER — Telehealth: Payer: Self-pay

## 2017-01-24 ENCOUNTER — Ambulatory Visit: Payer: Medicare Other | Admitting: Family Medicine

## 2017-01-24 DIAGNOSIS — R9389 Abnormal findings on diagnostic imaging of other specified body structures: Secondary | ICD-10-CM

## 2017-01-24 NOTE — Telephone Encounter (Signed)
Called pt no answer. LM for pt informing her of the need for referral to Endocrinologist per Dr.Shah. Referral placed.

## 2017-01-29 ENCOUNTER — Encounter: Payer: Self-pay | Admitting: Family Medicine

## 2017-01-29 ENCOUNTER — Ambulatory Visit (INDEPENDENT_AMBULATORY_CARE_PROVIDER_SITE_OTHER): Payer: Medicare Other | Admitting: Family Medicine

## 2017-01-29 VITALS — BP 130/74 | HR 90 | Temp 98.5°F | Resp 14 | Ht 69.0 in | Wt 221.3 lb

## 2017-01-29 DIAGNOSIS — E049 Nontoxic goiter, unspecified: Secondary | ICD-10-CM

## 2017-01-29 DIAGNOSIS — I10 Essential (primary) hypertension: Secondary | ICD-10-CM | POA: Diagnosis not present

## 2017-01-29 NOTE — Progress Notes (Signed)
Name: Rebecca Anthony   MRN: [id number]    DOB: [birth year]   Date:01/29/2017       Progress Note  Subjective  Chief Complaint  Chief Complaint  Patient presents with  . Medication Refill    toprol   . Follow-up    Doasge change on toprol     HPI  Patient presents for follow up on Toprol XL 50 mg for tachycardia and elevated blood pressure, her heart rate is 90 bpm, down from 111 bpm from 2 weeks ago. Her blood pressure is similarly improved from 136/82 to 130/74 today. She reports long history of palpitations, sometimes able to hear heart beat at night. Has had work up by Cardiology including holter monitor and reports everything was normal.  She is also pursuing workup for thyroid dysfunction, ultrasound of thyroid was completed last week which showed marked heterogeneous enlarged lobular thyroid gland with multiple areas of pseudo-nodularity, she is going to get a TSH level completed and then will follow-up. Ultrasound note reviewed  Past Medical History:  Diagnosis Date  . Dysuria   . Heart murmur   . Hematuria, unspecified   . History of shingles   . History of swelling of feet   . Hypertension   . Irritable bowel syndrome   . Lichenification and lichen simplex chronicus   . Localized superficial swelling, mass, or lump   . Osteoarthrosis, unspecified whether generalized or localized, unspecified site   . Osteoporosis, unspecified   . PAF (paroxysmal atrial fibrillation) ([place])    Diagnosed years ago -- originally on Quinidine  . Sinus problem   . Synovial cyst, unspecified     Past Surgical History:  Procedure Laterality Date  . ANKLE SURGERY Left   . APPENDECTOMY    . AV FISTULA REPAIR    . FOOT SURGERY Left   . VAGINAL HYSTERECTOMY    . VESICOVAGINAL FISTULA CLOSURE W/ TAH      Family History  Problem Relation Age of Onset  . Diabetes Mother   . Hypertension Mother   . Heart failure Mother   . High blood pressure Father   . Heart disease Father   .  Hypertension Father   . Breast cancer Neg Hx     Social History   Social History  . Marital status: Married    Spouse name: N/A  . Number of children: N/A  . Years of education: N/A   Occupational History  . Not on file.   Social History Main Topics  . Smoking status: Never Smoker  . Smokeless tobacco: Never Used  . Alcohol use No  . Drug use: No  . Sexual activity: Yes    Birth control/ protection: None, Surgical   Other Topics Concern  . Not on file   Social History Narrative   She is married. Recently moved from [facility] back to [place].   Never drinks alcohol. Has never smoked.   Minimal amount of caffeine intake.   She is active, but not with regular exercise.     Current Outpatient Prescriptions:  .  alendronate (FOSAMAX) 70 MG tablet, Take 1 tablet (70 mg total) by mouth once a week. Take with a full glass of water on an empty stomach., Disp: 12 tablet, Rfl: 3 .  aspirin 81 MG tablet, Take 1 tablet (81 mg total) by mouth daily., Disp: 30 tablet, Rfl:  .  calcium carbonate (OSCAL) 1500 (600 Ca) MG TABS tablet, Take by mouth 2 (two) times daily with a meal.,  Disp: , Rfl:  .  metoprolol succinate (TOPROL-XL) 50 MG 24 hr tablet, TAKE 1 TABLET BY MOUTH EVERY DAY, Disp: 90 tablet, Rfl: 0 .  polyethylene glycol powder (GLYCOLAX/MIRALAX) powder, Take 17 g by mouth daily. Dissolve 1 capful in 8 oz of liquid daily PRN, Disp: 3350 g, Rfl: 1 .  vitamin C (ASCORBIC ACID) 500 MG tablet, Take 500 mg by mouth daily., Disp: , Rfl:  .  omeprazole (PRILOSEC) 40 MG capsule, Take 40 mg by mouth as needed. , Disp: , Rfl:   Allergies  Allergen Reactions  . Sulfa Antibiotics   . Anaprox [Naproxen Sodium] Rash     ROS  Please see history of present illness for complete discussion of ROS  Objective  Vitals:   01/29/17 1454  BP: 130/74  Pulse: 90  Resp: 14  Temp: 98.5 F (36.9 C)  TempSrc: Oral  SpO2: 95%  Weight: 221 lb 4.8 oz (100.4 kg)  Height: 5\' 9"  (1.753 m)    Physical  Exam  Constitutional: She is oriented to person, place, and time and well-developed, well-nourished, and in no distress.  HENT:  Head: Normocephalic and atraumatic.  Cardiovascular: Normal rate, regular rhythm and normal heart sounds.   No murmur heard. Pulmonary/Chest: Effort normal and breath sounds normal. She has no wheezes.  Neurological: She is alert and oriented to person, place, and time.  Psychiatric: Mood, memory, affect and judgment normal.  Nursing note and vitals reviewed.      Assessment & Plan  1. Essential hypertension Blood pressure and heart rate are both within normal range, patient reassured, asymptomatic and therefore no cardiac workup needed  2. Thyroid goiter She is going to obtain TSH, based on the numbers and ultrasound findings, may need referral to endocrinology. She will coordinate it with her specialist   Dilia Alemany Asad A. [facility] 01/29/2017 3:02 PM

## 2017-01-30 ENCOUNTER — Telehealth: Payer: Self-pay | Admitting: Family Medicine

## 2017-01-30 ENCOUNTER — Other Ambulatory Visit: Payer: Self-pay | Admitting: Family Medicine

## 2017-01-30 DIAGNOSIS — Z1231 Encounter for screening mammogram for malignant neoplasm of breast: Secondary | ICD-10-CM

## 2017-01-31 ENCOUNTER — Ambulatory Visit: Payer: Medicare Other | Admitting: Podiatry

## 2017-02-05 ENCOUNTER — Telehealth: Payer: Self-pay | Admitting: Family Medicine

## 2017-02-05 NOTE — Telephone Encounter (Signed)
Copied from [place] [id number]. Topic: Quick Communication - See Telephone Encounter >> Feb 05, 2017  3:30 PM Bea Graff, [place] wrote: CRM for notification. See Telephone encounter for:  02/05/17. Patient called wanting to see if her labs she had drawn last week have resulted yet. Can leave a detailed vm on home phone if pt is not able to answer.

## 2017-02-06 ENCOUNTER — Encounter: Payer: Medicare Other | Admitting: Family Medicine

## 2017-02-06 NOTE — Telephone Encounter (Signed)
Pt called back for results. Please call pt back or send back to me if ok to notify pt.

## 2017-02-06 NOTE — Telephone Encounter (Signed)
This encounter was created in error - please disregard.

## 2017-02-07 ENCOUNTER — Ambulatory Visit (INDEPENDENT_AMBULATORY_CARE_PROVIDER_SITE_OTHER): Payer: Medicare Other | Admitting: Podiatry

## 2017-02-07 ENCOUNTER — Encounter: Payer: Self-pay | Admitting: Podiatry

## 2017-02-07 ENCOUNTER — Encounter: Payer: Self-pay | Admitting: Family Medicine

## 2017-02-07 DIAGNOSIS — Q828 Other specified congenital malformations of skin: Secondary | ICD-10-CM | POA: Diagnosis not present

## 2017-02-07 NOTE — Progress Notes (Signed)
She presents today for chief complaint of painful lesions plantar aspect of the left foot.  Objective: Vital signs are stable she is alert and oriented 3. Pulses are palpable. Tight Achilles is resulting in forefoot metatarsalgia and benign soft tissue lesions. Allograft assessment: Porokeratosis benign soft tissue lesion plantar aspect of left foot.  Plan: Debridement of all reactive hyperkeratosis.

## 2017-02-09 ENCOUNTER — Encounter: Payer: Self-pay | Admitting: Family Medicine

## 2017-02-09 NOTE — Telephone Encounter (Signed)
Called [facility] and asked them to fax over the lab results. Pt asked for all labs that was done on 01/31/2017. Tsh has been already [facility] out to pt and scan therefore lipid, amylase and lipase will be mailed out to the pt and scanned in the chart

## 2017-02-09 NOTE — Telephone Encounter (Signed)
Pt is wanting results of Amylase and Lipase. No results noted in chart. Per pt states 3 vials of blood drawn on 01/31/18.

## 2017-02-09 NOTE — Telephone Encounter (Signed)
Amber there is no results in box. You may want to notify the patient to see if she had blood work done or where at?

## 2017-02-12 ENCOUNTER — Ambulatory Visit: Payer: Medicare Other | Admitting: Podiatry

## 2017-02-13 ENCOUNTER — Telehealth: Payer: Self-pay | Admitting: General Surgery

## 2017-02-13 NOTE — Telephone Encounter (Signed)
Results given for TSH, amylase Lipase and Lipid panel

## 2017-02-13 NOTE — Telephone Encounter (Signed)
Copied from [place] #[id number]. Topic: Quick Communication - See Telephone Encounter >> Feb 13, 2017  1:31 PM Boyd Kerbs wrote: CRM for notification. See Telephone encounter for:  02/13/17. Lab results

## 2017-02-14 ENCOUNTER — Ambulatory Visit (INDEPENDENT_AMBULATORY_CARE_PROVIDER_SITE_OTHER): Payer: Medicare Other | Admitting: Family Medicine

## 2017-02-14 ENCOUNTER — Encounter: Payer: Self-pay | Admitting: Family Medicine

## 2017-02-14 VITALS — BP 126/72 | HR 88 | Temp 97.9°F | Resp 14 | Ht 69.0 in | Wt 216.5 lb

## 2017-02-14 DIAGNOSIS — Z23 Encounter for immunization: Secondary | ICD-10-CM

## 2017-02-14 DIAGNOSIS — Z0000 Encounter for general adult medical examination without abnormal findings: Secondary | ICD-10-CM | POA: Diagnosis not present

## 2017-02-14 NOTE — Progress Notes (Signed)
Name: Rebecca Anthony   MRN: [id number]    DOB: 1946/03/04   Date:02/14/2017       Progress Note  Subjective  Chief Complaint  Chief Complaint  Patient presents with  . Annual Exam    HPI  Pt. presents for Complete Physical Exam.  She has Osteoporosis and is on Alendronate 70 mg every week. She had a negative Cologuard test in July 2018. She is scheduled for Mammogram in November 2018.   Past Medical History:  Diagnosis Date  . Dysuria   . Heart murmur   . Hematuria, unspecified   . History of shingles   . History of swelling of feet   . Hypertension   . Irritable bowel syndrome   . Lichenification and lichen simplex chronicus   . Localized superficial swelling, mass, or lump   . Osteoarthrosis, unspecified whether generalized or localized, unspecified site   . Osteoporosis, unspecified   . PAF (paroxysmal atrial fibrillation) ([place])    Diagnosed years ago -- originally on Quinidine  . Sinus problem   . Synovial cyst, unspecified     Past Surgical History:  Procedure Laterality Date  . ANKLE SURGERY Left   . APPENDECTOMY    . AV FISTULA REPAIR    . FOOT SURGERY Left   . VAGINAL HYSTERECTOMY    . VESICOVAGINAL FISTULA CLOSURE W/ TAH      Family History  Problem Relation Age of Onset  . Diabetes Mother   . Hypertension Mother   . Heart failure Mother   . High blood pressure Father   . Heart disease Father   . Hypertension Father   . Breast cancer Neg Hx     Social History   Social History  . Marital status: Married    Spouse name: N/A  . Number of children: N/A  . Years of education: N/A   Occupational History  . Not on file.   Social History Main Topics  . Smoking status: Never Smoker  . Smokeless tobacco: Never Used  . Alcohol use No  . Drug use: No  . Sexual activity: Yes    Birth control/ protection: None, Surgical   Other Topics Concern  . Not on file   Social History Narrative   She is married. Recently moved from [facility] back to [place].    Never drinks alcohol. Has never smoked.   Minimal amount of caffeine intake.   She is active, but not with regular exercise.     Current Outpatient Prescriptions:  .  alendronate (FOSAMAX) 70 MG tablet, Take 1 tablet (70 mg total) by mouth once a week. Take with a full glass of water on an empty stomach., Disp: 12 tablet, Rfl: 3 .  aspirin 81 MG tablet, Take 1 tablet (81 mg total) by mouth daily., Disp: 30 tablet, Rfl:  .  Biotin 1000 MCG tablet, Take 1,000 mcg by mouth daily., Disp: , Rfl:  .  calcium carbonate (OSCAL) 1500 (600 Ca) MG TABS tablet, Take by mouth 2 (two) times daily with a meal., Disp: , Rfl:  .  levothyroxine (SYNTHROID, LEVOTHROID) 25 MCG tablet, Take 1 tablet by mouth daily., Disp: , Rfl: 2 .  metoprolol succinate (TOPROL-XL) 50 MG 24 hr tablet, TAKE 1 TABLET BY MOUTH EVERY DAY, Disp: 90 tablet, Rfl: 0 .  omeprazole (PRILOSEC) 40 MG capsule, Take 40 mg by mouth as needed. , Disp: , Rfl:  .  polyethylene glycol powder (GLYCOLAX/MIRALAX) powder, Take 17 g by mouth daily. Dissolve 1  capful in 8 oz of liquid daily PRN, Disp: 3350 g, Rfl: 1 .  vitamin C (ASCORBIC ACID) 500 MG tablet, Take 500 mg by mouth daily., Disp: , Rfl:   Allergies  Allergen Reactions  . Sulfa Antibiotics   . Anaprox [Naproxen Sodium] Rash     Review of Systems  Constitutional: Negative for chills, fever and malaise/fatigue.  HENT: Positive for sinus pain. Negative for congestion, ear pain and sore throat.   Eyes: Negative for blurred vision and double vision.  Respiratory: Negative for cough, sputum production and shortness of breath.   Cardiovascular: Positive for palpitations (intermittent rapid heart beat episodes). Negative for chest pain and leg swelling.  Gastrointestinal: Positive for abdominal pain (intermittent left lower quadrant abdominal fullness.). Negative for blood in stool, constipation, diarrhea, nausea and vomiting.  Genitourinary: Positive for urgency. Negative for hematuria.   Musculoskeletal: Positive for back pain and joint pain (intermittent hand pain). Negative for neck pain.  Skin: Positive for itching (itching on left lower back). Negative for rash.  Neurological: Negative for dizziness and headaches.  Psychiatric/Behavioral: Negative for depression. The patient is not nervous/anxious and does not have insomnia.     Objective  Vitals:   02/14/17 1516  BP: 126/72  Pulse: 88  Resp: 14  Temp: 97.9 F (36.6 C)  TempSrc: Oral  SpO2: 97%  Weight: 216 lb 8 oz (98.2 kg)  Height: 5\' 9"  (1.753 m)    Physical Exam  Constitutional: She is oriented to person, place, and time and well-developed, well-nourished, and in no distress.  HENT:  Head: Normocephalic and atraumatic.  Cardiovascular: Normal rate, S1 normal, S2 normal and normal heart sounds.  An irregular rhythm present.  Pulmonary/Chest: Breath sounds normal. She has no decreased breath sounds. She has no wheezes.  Abdominal: Soft. There is tenderness in the left lower quadrant.  Neurological: She is alert and oriented to person, place, and time.  Skin: Skin is warm, dry and intact.  Psychiatric: Mood, memory, affect and judgment normal.  Nursing note and vitals reviewed.    Assessment & Plan  1. Needs flu shot  - Flu vaccine HIGH DOSE PF (Fluzone High dose)  2. Annual physical exam Obtain age appropriate lab screening. - CBC with Differential/Platelet - VITAMIN D 25 Hydroxy (Vit-D Deficiency, Fractures) - COMPLETE METABOLIC PANEL WITH GFR  Abrahm Mancia Asad A. [facility] 02/14/2017 3:39 PM

## 2017-02-15 ENCOUNTER — Telehealth: Payer: Self-pay | Admitting: Family Medicine

## 2017-02-15 NOTE — Telephone Encounter (Signed)
Copied from [place] [id number]. Topic: Quick Communication - See Telephone Encounter >> Feb 15, 2017  3:48 PM Corie Chiquito, [place] wrote: CRM for notification. See Telephone encounter for:  02/15/17. Patient calling because she wanted to know if the lab work that she needs done going to be fasting labs. Also the biotin that she is taking is a 1000 mg that she takes once a day. The lab report for the colon cancer is going to be sent over by [facility] that was done in July of this year for Dr.Shah to put into her medical file. Patient would like if someone could give her a call back and it is ok to leave a message if there isn't an answer at home.

## 2017-02-19 ENCOUNTER — Ambulatory Visit: Payer: Medicare Other | Admitting: Podiatry

## 2017-02-20 ENCOUNTER — Telehealth: Payer: Self-pay

## 2017-02-20 NOTE — Telephone Encounter (Signed)
Copied from [place] [id number]. Topic: Quick Communication - See Telephone Encounter >> Feb 15, 2017  3:48 PM Corie Chiquito, [place] wrote: CRM for notification. See Telephone encounter for:  02/15/17. Patient calling because she wanted to know if the lab work that she needs done going to be fasting labs. Also the biotin that she is taking is a 1000 mg that she takes once a day. The lab report for the colon cancer is going to be sent over by [facility] that was done in July of this year for Dr.Shah to put into her medical file. Patient would like if someone could give her a call back and it is ok to leave a message if there isn't an answer at home. >> Feb 16, 2017  3:02 PM Sandi Mariscal E, NT wrote: Pt. Called back about wanting to know if she has to fast before blood work is done.

## 2017-02-21 ENCOUNTER — Ambulatory Visit: Payer: Medicare Other | Admitting: Podiatry

## 2017-02-21 ENCOUNTER — Encounter: Payer: Self-pay | Admitting: Family Medicine

## 2017-02-23 ENCOUNTER — Telehealth: Payer: Self-pay

## 2017-02-23 ENCOUNTER — Encounter: Payer: Self-pay | Admitting: Endocrinology

## 2017-02-23 ENCOUNTER — Ambulatory Visit: Payer: Medicare Other | Admitting: Endocrinology

## 2017-02-23 DIAGNOSIS — E039 Hypothyroidism, unspecified: Secondary | ICD-10-CM | POA: Insufficient documentation

## 2017-02-23 LAB — TSH: TSH: 2.9 u[IU]/mL (ref 0.35–4.50)

## 2017-02-23 NOTE — Telephone Encounter (Signed)
Copied from [place] #[id number]. Topic: Inquiry >> Feb 23, 2017  1:03 PM Oliver Pila B wrote: Reason for CRM: PT is seeing a specialist today. Dr. Loanne Drilling Fax is [phone], pt is needing the lab results from her most recent visit for the doctor, but contact pt if needed

## 2017-02-23 NOTE — Patient Instructions (Signed)
blood tests are requested for you today.  We'll let you know about the results. Please come back for a follow-up appointment in 1 year.   

## 2017-02-23 NOTE — Progress Notes (Signed)
Subjective:    [name], female    DOB: November 22, 1945, 71 y.o.   MRN: [id number]  HPI  Pt is referred by Dr Manuella Ghazi, for thyroid problems. she reports hypothyroidism was dx'ed 1 month ago.  She was rx'ed synthroid 25/d.  she has never taken kelp or any other type of non-prescribed thyroid product.  she has never had thyroid surgery, or XRT to the neck.  she has never been on amiodarone or lithium.  She has moderate weight gain, and assoc fatigue.  Past Medical History:  Diagnosis Date  . Dysuria   . Heart murmur   . Hematuria, unspecified   . History of shingles   . History of swelling of feet   . Hypertension   . Irritable bowel syndrome   . Lichenification and lichen simplex chronicus   . Localized superficial swelling, mass, or lump   . Osteoarthrosis, unspecified whether generalized or localized, unspecified site   . Osteoporosis, unspecified   . PAF (paroxysmal atrial fibrillation) ([place])    Diagnosed years ago -- originally on Quinidine  . Sinus problem   . Synovial cyst, unspecified     Past Surgical History:  Procedure Laterality Date  . ANKLE SURGERY Left   . APPENDECTOMY    . AV FISTULA REPAIR    . FOOT SURGERY Left   . VAGINAL HYSTERECTOMY    . VESICOVAGINAL FISTULA CLOSURE W/ TAH      Social History   Socioeconomic History  . Marital status: Married    Spouse name: Not on file  . Number of children: Not on file  . Years of education: Not on file  . Highest education level: Not on file  Social Needs  . Financial resource strain: Not on file  . Food insecurity - worry: Not on file  . Food insecurity - inability: Not on file  . Transportation needs - medical: Not on file  . Transportation needs - non-medical: Not on file  Occupational History  . Not on file  Tobacco Use  . Smoking status: Never Smoker  . Smokeless tobacco: Never Used  Substance and Sexual Activity  . Alcohol use: No  . Drug use: No  . Sexual activity: Yes    Birth  control/protection: None, Surgical  Other Topics Concern  . Not on file  Social History Narrative   She is married. Recently moved from [facility] back to [place].   Never drinks alcohol. Has never smoked.   Minimal amount of caffeine intake.   She is active, but not with regular exercise.    Current Outpatient Medications on File Prior to Visit  Medication Sig Dispense Refill  . alendronate (FOSAMAX) 70 MG tablet Take 1 tablet (70 mg total) by mouth once a week. Take with a full glass of water on an empty stomach. 12 tablet 3  . aspirin 81 MG tablet Take 1 tablet (81 mg total) by mouth daily. 30 tablet   . Biotin 1000 MCG tablet Take 1,000 mcg by mouth daily.    . calcium carbonate (OSCAL) 1500 (600 Ca) MG TABS tablet Take by mouth 2 (two) times daily with a meal.    . levothyroxine (SYNTHROID, LEVOTHROID) 25 MCG tablet Take 1 tablet by mouth daily.  2  . metoprolol succinate (TOPROL-XL) 50 MG 24 hr tablet TAKE 1 TABLET BY MOUTH EVERY DAY 90 tablet 0  . omeprazole (PRILOSEC) 40 MG capsule Take 40 mg by mouth as needed.     . polyethylene  glycol powder (GLYCOLAX/MIRALAX) powder Take 17 g by mouth daily. Dissolve 1 capful in 8 oz of liquid daily PRN 3350 g 1  . vitamin C (ASCORBIC ACID) 500 MG tablet Take 500 mg by mouth daily.     No current facility-administered medications on file prior to visit.     Allergies  Allergen Reactions  . Sulfa Antibiotics   . Anaprox [Naproxen Sodium] Rash    Family History  Problem Relation Age of Onset  . Diabetes Mother   . Hypertension Mother   . Heart failure Mother   . High blood pressure Father   . Heart disease Father   . Hypertension Father   . Thyroid disease Sister   . Breast cancer Neg Hx     BP 122/78 (BP Location: Left Arm, Patient Position: Sitting, Cuff Size: Normal)   Pulse 90   Wt 219 lb 6.4 oz (99.5 kg)   SpO2 98%   BMI 32.40 kg/m     Review of Systems Denies fever, hoarseness, neck pain, visual loss, chest pain, sob, cough,  dysphagia, diarrhea, itching, flushing, depression, headache, numbness, and rhinorrhea.  She has easy bruising and cold interolerance    Objective:   Physical Exam VS: see vs page GEN: no distress HEAD: head: no deformity eyes: no periorbital swelling, no proptosis external nose and ears are normal mouth: no lesion seen NECK: there is 3 cm diameter swelling the right thyroid bed.   CHEST WALL: no deformity LUNGS: clear to auscultation CV: reg rate and rhythm, no murmur ABD: abdomen is soft, nontender.  no hepatosplenomegaly.  not distended.  no hernia MUSCULOSKELETAL: muscle bulk and strength are grossly normal.  no obvious joint swelling.  gait is normal and steady EXTEMITIES: no deformity.  no edema.  PULSES: no carotid bruit NEURO:  cn 2-12 grossly intact.   readily moves all 4's.  sensation is intact to touch on all 4's SKIN:  Normal texture and temperature.  No rash or suspicious lesion is visible.   NODES:  None palpable at the neck PSYCH: alert, well-oriented.  Does not appear anxious nor depressed.    outside test results are reviewed:  TSH=5.0.    [place]: markedly heterogeneous, enlarged and lobular thyroid gland with multiple areas of pseudo nodularity. 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   I have reviewed outside records, and summarized: Pt was noted to have elevated a1c, and referred here.  Main probs addressed were HTN and palpitations.      Assessment & Plan:  Hypothyroidism, new to me, due for recheck.  Goiter, with pseudonodules.  In this setting, she should have TSH norrmalized.    Patient Instructions  blood tests are requested for you today.  We'll let you know about the results. Please come back for a follow-up appointment in 1 year.

## 2017-02-23 NOTE — Telephone Encounter (Signed)
Dr. Loanne Drilling is a part of [facility] so they have access to her records.

## 2017-02-26 ENCOUNTER — Encounter: Payer: Self-pay | Admitting: Podiatry

## 2017-02-26 ENCOUNTER — Ambulatory Visit (INDEPENDENT_AMBULATORY_CARE_PROVIDER_SITE_OTHER): Payer: Medicare Other | Admitting: Podiatry

## 2017-02-26 DIAGNOSIS — M76829 Posterior tibial tendinitis, unspecified leg: Secondary | ICD-10-CM

## 2017-02-26 MED ORDER — NAFTIFINE HCL 1 % EX CREA: TOPICAL_CREAM | Freq: Every day | CUTANEOUS | 0 refills | Status: DC

## 2017-02-26 NOTE — Progress Notes (Signed)
She presents today to discuss her orthotics also to discuss a rash between her toes.  Objective: Vital signs are stable she's alert and oriented 3. I evaluated her orthotics which will more than likely be just fine particularly for her dressier shoes I recommended that while wearing her tennis shoe she continue to use her old orthotics with a deeper heel seat.  She does have some skin breakdown between her toes consistent with tinea pedis mild erythema no cellulitis no drainage no motor area  Assessment: Tinea pedis pes planus left.  Plan: Start her on Naftin cream 1% to be applied twice daily.

## 2017-02-28 ENCOUNTER — Telehealth: Payer: Self-pay | Admitting: Endocrinology

## 2017-02-28 NOTE — Telephone Encounter (Signed)
Patient is calling for lab result

## 2017-02-28 NOTE — Telephone Encounter (Signed)
Patient called & labs reviewed.

## 2017-03-01 ENCOUNTER — Telehealth: Payer: Self-pay | Admitting: Family Medicine

## 2017-03-01 ENCOUNTER — Other Ambulatory Visit: Payer: Self-pay | Admitting: Family Medicine

## 2017-03-01 DIAGNOSIS — M545 Low back pain: Secondary | ICD-10-CM

## 2017-03-01 MED ORDER — TIZANIDINE HCL 2 MG PO TABS: 2.0000 mg | ORAL_TABLET | Freq: Three times a day (TID) | ORAL | 0 refills | Status: DC | PRN

## 2017-03-01 NOTE — Telephone Encounter (Signed)
Prescription for low-dose tizanidine 2 mg every 8 hours as needed is sent to patient's pharmacy.

## 2017-03-01 NOTE — Telephone Encounter (Signed)
Copied from [place] [phone]. Topic: Quick Communication - See Telephone Encounter >> Mar 01, 2017  4:24 PM Burnis Medin, NT wrote: CRM for notification. See Telephone encounter for: Pt. Is calling in because she thinks she pulled a muscle in her back. Pt wanted to know if the doctor could call her in a muscle relaxer. Pt. Uses [facility] on [address] in [place].  03/01/17.

## 2017-03-13 ENCOUNTER — Other Ambulatory Visit: Payer: Self-pay | Admitting: Family Medicine

## 2017-03-13 ENCOUNTER — Telehealth: Payer: Self-pay | Admitting: Family Medicine

## 2017-03-13 DIAGNOSIS — I499 Cardiac arrhythmia, unspecified: Secondary | ICD-10-CM

## 2017-03-13 MED ORDER — METOPROLOL SUCCINATE ER 50 MG PO TB24: 50.0000 mg | ORAL_TABLET | Freq: Every day | ORAL | 0 refills | Status: DC

## 2017-03-13 NOTE — Telephone Encounter (Signed)
Pt called to see if she needs to cont to take the Tizanidine as she has been taking it for more than 5 days. She states that the pain is still there and wonders if she needs to get something else or is it safe to cont with this medication. She is also requesting a refill on her Toprol XL

## 2017-03-14 NOTE — Telephone Encounter (Addendum)
Toprol has been order yesterday. Pt has been informed about the refill

## 2017-03-14 NOTE — Telephone Encounter (Signed)
If the pain is still present after taking tizanidine, she should schedule an appointment for evaluation of her symptoms

## 2017-03-15 ENCOUNTER — Telehealth: Payer: Self-pay

## 2017-03-15 DIAGNOSIS — M545 Low back pain: Secondary | ICD-10-CM

## 2017-03-15 NOTE — Telephone Encounter (Signed)
Pt called in and states the medicine is working. She states that she might be having muscle skeleton problem but She's asking if she can continue to take Zanaflex after the five days that Dr. Manuella Ghazi recommend her to take. She didn't want to continue to take it unless she check with you. Pt also will schedule an apt.

## 2017-03-15 NOTE — Telephone Encounter (Signed)
Left patient a detail messaging asking her to call our office back to schedule an apt with our NP and the other  providers

## 2017-03-15 NOTE — Telephone Encounter (Signed)
It is okay to take Zanaflex  for more than 5 days, a new prescription may be provided with the same directions for the patient.

## 2017-03-20 MED ORDER — TIZANIDINE HCL 2 MG PO TABS: 2.0000 mg | ORAL_TABLET | Freq: Three times a day (TID) | ORAL | 0 refills | Status: DC | PRN

## 2017-03-20 NOTE — Telephone Encounter (Signed)
Rx has been sent to the pharmacy and she has been notified

## 2017-03-22 ENCOUNTER — Ambulatory Visit
Admission: RE | Admit: 2017-03-22 | Discharge: 2017-03-22 | Disposition: A | Discharge status: Home / Self Care | Payer: Medicare Other | Source: Ambulatory Visit | Attending: Family Medicine | Admitting: Family Medicine

## 2017-03-22 DIAGNOSIS — Z1231 Encounter for screening mammogram for malignant neoplasm of breast: Secondary | ICD-10-CM | POA: Diagnosis not present

## 2017-03-29 ENCOUNTER — Ambulatory Visit: Payer: Medicare Other | Admitting: Family Medicine

## 2017-04-05 ENCOUNTER — Ambulatory Visit: Payer: Medicare Other | Admitting: Family Medicine

## 2017-04-11 ENCOUNTER — Ambulatory Visit: Payer: Medicare Other | Admitting: Family Medicine

## 2017-04-13 ENCOUNTER — Telehealth: Payer: Self-pay | Admitting: Family Medicine

## 2017-04-13 NOTE — Telephone Encounter (Signed)
Copied from [place] [phone]. Topic: Inquiry >> Apr 13, 2017 11:14 AM Neva Seat wrote: [facility] - phone [phone] - sent a fax for Amlodipine Besylate tabs 5mg   for pt to continue with refills.  They will need Dr. Trena Platt approval for continued refills.  He has some left and doesn't want to run out.

## 2017-04-16 ENCOUNTER — Ambulatory Visit: Payer: Medicare Other | Admitting: Family Medicine

## 2017-04-16 NOTE — Telephone Encounter (Signed)
Based on this patient's medical records, she is not on amlodipine, please confirm

## 2017-04-20 NOTE — Telephone Encounter (Signed)
Tried to reach out to the patient regarding her amlodipine. Left a message to ask her If she is on this meds who prescribed it and when did she start

## 2017-04-20 NOTE — Telephone Encounter (Addendum)
Pt called back and said this was a mistake , the rx was for her husband illa enlow   [facility] and the medication has been shipped.

## 2017-04-30 ENCOUNTER — Ambulatory Visit: Payer: Medicare Other | Admitting: Family Medicine

## 2017-05-07 ENCOUNTER — Ambulatory Visit: Payer: Medicare Other | Admitting: Family Medicine

## 2017-05-10 ENCOUNTER — Ambulatory Visit: Payer: Medicare Other | Admitting: Family Medicine

## 2017-05-17 ENCOUNTER — Encounter: Payer: Self-pay | Admitting: Family Medicine

## 2017-05-17 ENCOUNTER — Ambulatory Visit: Payer: Medicare Other | Admitting: Family Medicine

## 2017-05-17 VITALS — BP 134/76 | HR 85 | Temp 97.3°F | Resp 14 | Ht 69.0 in | Wt 218.0 lb

## 2017-05-17 DIAGNOSIS — E039 Hypothyroidism, unspecified: Secondary | ICD-10-CM

## 2017-05-17 DIAGNOSIS — M85852 Other specified disorders of bone density and structure, left thigh: Secondary | ICD-10-CM | POA: Diagnosis not present

## 2017-05-17 DIAGNOSIS — R1032 Left lower quadrant pain: Secondary | ICD-10-CM | POA: Diagnosis not present

## 2017-05-17 DIAGNOSIS — Z1159 Encounter for screening for other viral diseases: Secondary | ICD-10-CM

## 2017-05-17 NOTE — Progress Notes (Signed)
Name: Rebecca Anthony   MRN: [id number]    DOB: 08/26/1945   Date:05/17/2017       Progress Note  Subjective  Chief Complaint  Chief Complaint  Patient presents with  . Follow-up    HPI  Shunt presents for multiple general questions, she is concerned about checking her thyroid function, has history of goiter, last TSH was normal in November 2018, no apparent low thyroid symptoms.  And also has persistent left lower quadrant abdominal pain, appears musculoskeletal, workup has been unremarkable but she is still bothered by it and wishes to undergo the next step as suggested which would be an MRI of abdomen.  She has called her insurance company who is willing to cover the test.  Patient would also like to be tested for hepatitis C as she got a screening message from [place], will order hep C screening today Past Medical History:  Diagnosis Date  . Dysuria   . Heart murmur   . Hematuria, unspecified   . History of shingles   . History of swelling of feet   . Hypertension   . Irritable bowel syndrome   . Lichenification and lichen simplex chronicus   . Localized superficial swelling, mass, or lump   . Osteoarthrosis, unspecified whether generalized or localized, unspecified site   . Osteoporosis, unspecified   . PAF (paroxysmal atrial fibrillation) ([place])    Diagnosed years ago -- originally on Quinidine  . Sinus problem   . Synovial cyst, unspecified     Past Surgical History:  Procedure Laterality Date  . ANKLE SURGERY Left   . APPENDECTOMY    . AV FISTULA REPAIR    . FOOT SURGERY Left   . VAGINAL HYSTERECTOMY    . VESICOVAGINAL FISTULA CLOSURE W/ TAH      Family History  Problem Relation Age of Onset  . Diabetes Mother   . Hypertension Mother   . Heart failure Mother   . High blood pressure Father   . Heart disease Father   . Hypertension Father   . Thyroid disease Sister   . Breast cancer Neg Hx     Social History   Socioeconomic History  . Marital status:  Married    Spouse name: Not on file  . Number of children: Not on file  . Years of education: Not on file  . Highest education level: Not on file  Social Needs  . Financial resource strain: Not on file  . Food insecurity - worry: Not on file  . Food insecurity - inability: Not on file  . Transportation needs - medical: Not on file  . Transportation needs - non-medical: Not on file  Occupational History  . Not on file  Tobacco Use  . Smoking status: Never Smoker  . Smokeless tobacco: Never Used  Substance and Sexual Activity  . Alcohol use: No  . Drug use: No  . Sexual activity: Yes    Birth control/protection: None, Surgical  Other Topics Concern  . Not on file  Social History Narrative   She is married. Recently moved from [facility] back to [place].   Never drinks alcohol. Has never smoked.   Minimal amount of caffeine intake.   She is active, but not with regular exercise.     Current Outpatient Medications:  .  alendronate (FOSAMAX) 70 MG tablet, Take 1 tablet (70 mg total) by mouth once a week. Take with a full glass of water on an empty stomach., Disp: 12 tablet, Rfl: 3 .  aspirin 81 MG tablet, Take 1 tablet (81 mg total) by mouth daily., Disp: 30 tablet, Rfl:  .  calcium carbonate (OSCAL) 1500 (600 Ca) MG TABS tablet, Take by mouth 2 (two) times daily with a meal., Disp: , Rfl:  .  levothyroxine (SYNTHROID, LEVOTHROID) 25 MCG tablet, Take 1 tablet by mouth daily., Disp: , Rfl: 2 .  metoprolol succinate (TOPROL-XL) 50 MG 24 hr tablet, TAKE 1 TABLET BY MOUTH EVERY DAY, Disp: 30 tablet, Rfl: 0 .  polyethylene glycol powder (GLYCOLAX/MIRALAX) powder, Take 17 g by mouth daily. Dissolve 1 capful in 8 oz of liquid daily PRN, Disp: 3350 g, Rfl: 1 .  tiZANidine (ZANAFLEX) 2 MG tablet, Take 1 tablet (2 mg total) by mouth every 8 (eight) hours as needed for muscle spasms., Disp: 270 tablet, Rfl: 0 .  vitamin C (ASCORBIC ACID) 500 MG tablet, Take 500 mg by mouth daily., Disp: , Rfl:  .  Biotin  1000 MCG tablet, Take 1,000 mcg by mouth daily., Disp: , Rfl:  .  naftifine (NAFTIN) 1 % cream, Apply daily topically. (Patient not taking: Reported on 05/17/2017), Disp: 30 g, Rfl: 0 .  omeprazole (PRILOSEC) 40 MG capsule, Take 40 mg by mouth as needed. , Disp: , Rfl:   Allergies  Allergen Reactions  . Sulfa Antibiotics   . Anaprox [Naproxen Sodium] Rash     ROS  Please see HPI for complete discussion of ROS  Objective  Vitals:   05/17/17 1608  BP: 134/76  Pulse: 85  Resp: 14  Temp: (!) 97.3 F (36.3 C)  TempSrc: Oral  SpO2: 97%  Weight: 218 lb (98.9 kg)  Height: 5\' 9"  (1.753 m)    Physical Exam  Constitutional: She is well-developed, well-nourished, and in no distress.  Cardiovascular: Normal rate, regular rhythm, S1 normal and S2 normal.  Murmur heard.  Systolic murmur is present with a grade of 1/6. Pulmonary/Chest: Effort normal and breath sounds normal. She has no wheezes.  Nursing note and vitals reviewed.      Recent Results (from the past 2160 hour(s))  TSH     Status: None   Collection Time: 02/23/17  4:22 PM  Result Value Ref Range   TSH 2.90 0.35 - 4.50 uIU/mL     Assessment & Plan  1. Hypothyroidism, unspecified type I have advised patient to continue on levothyroxine at 25 mcg and recheck TSH in 3 months  2. Need for hepatitis C screening test  - Hepatitis C antibody  3. Left lower quadrant pain Obtain MRI of abdomen with and without contrast - MR Abdomen W Wo Contrast; Future  4. Osteopenia of left femoral neck  - Vitamin D (25 hydroxy)  Rebecca Anthony [facility] 05/17/2017 4:17 PM

## 2017-05-18 LAB — COMPREHENSIVE METABOLIC PANEL
A/G RATIO: 1.9 (ref 1.2–2.2)
ALBUMIN: 4.3 g/dL (ref 3.5–4.8)
ALK PHOS: 71 IU/L (ref 39–117)
ALT: 20 IU/L (ref 0–32)
AST: 21 IU/L (ref 0–40)
BILIRUBIN TOTAL: 0.5 mg/dL (ref 0.0–1.2)
BUN / CREAT RATIO: 22 (ref 12–28)
BUN: 19 mg/dL (ref 8–27)
CHLORIDE: 103 mmol/L (ref 96–106)
CO2: 24 mmol/L (ref 20–29)
Calcium: 9.3 mg/dL (ref 8.7–10.3)
Creatinine, Ser: 0.87 mg/dL (ref 0.57–1.00)
GFR calc non Af Amer: 66 mL/min/{1.73_m2} (ref >59)
GFR, EST AFRICAN AMERICAN: 76 mL/min/{1.73_m2} (ref >59)
GLOBULIN, TOTAL: 2.3 g/dL (ref 1.5–4.5)
Glucose: 89 mg/dL (ref 65–99)
POTASSIUM: 4.4 mmol/L (ref 3.5–5.2)
SODIUM: 141 mmol/L (ref 134–144)
TOTAL PROTEIN: 6.6 g/dL (ref 6.0–8.5)

## 2017-05-18 LAB — CBC WITH DIFFERENTIAL/PLATELET
Basophils Absolute: 0 10*3/uL (ref 0.0–0.2)
Basos: 1 %
EOS (ABSOLUTE): 0.2 10*3/uL (ref 0.0–0.4)
EOS: 3 %
HEMATOCRIT: 41.6 % (ref 34.0–46.6)
HEMOGLOBIN: 13.5 g/dL (ref 11.1–15.9)
Immature Grans (Abs): 0 10*3/uL (ref 0.0–0.1)
Immature Granulocytes: 0 %
Lymphocytes Absolute: 1.5 10*3/uL (ref 0.7–3.1)
Lymphs: 29 %
MCH: 27.8 pg (ref 26.6–33.0)
MCHC: 32.5 g/dL (ref 31.5–35.7)
MCV: 86 fL (ref 79–97)
MONOCYTES: 9 %
MONOS ABS: 0.5 10*3/uL (ref 0.1–0.9)
NEUTROS ABS: 3 10*3/uL (ref 1.4–7.0)
Neutrophils: 58 %
Platelets: 294 10*3/uL (ref 150–379)
RBC: 4.85 x10E6/uL (ref 3.77–5.28)
RDW: 13.9 % (ref 12.3–15.4)
WBC: 5.1 10*3/uL (ref 3.4–10.8)

## 2017-05-18 LAB — VITAMIN D 25 HYDROXY (VIT D DEFICIENCY, FRACTURES): Vit D, 25-Hydroxy: 37.4 ng/mL (ref 30.0–100.0)

## 2017-05-22 ENCOUNTER — Telehealth: Payer: Self-pay | Admitting: Family Medicine

## 2017-05-22 NOTE — Telephone Encounter (Signed)
Patient called to discuss her lab results received on MyChart, she was given the results as noted by Dr. Manuella Ghazi 05/22/17, she verbalized understanding.

## 2017-05-22 NOTE — Telephone Encounter (Signed)
Copied from [place] [id number]. Topic: Quick Communication - See Telephone Encounter >> May 22, 2017  4:33 PM Boyd Kerbs wrote: CRM for notification. See Telephone encounter for:   Pt is wanting to see if MRI can include lower back area with the Abdomin?  She said she has had problems with back for long time and she is wanting to see if there is anything there.     05/22/17.

## 2017-05-23 ENCOUNTER — Encounter: Payer: Self-pay | Admitting: Family Medicine

## 2017-05-23 NOTE — Telephone Encounter (Signed)
Normally MRI of the lower back will be done under separate indications such as low back pain with radicular symptoms, weakness and numbness in legs etc. I will recommend continuing with the MRI of abdomen and pelvis to evaluate the source of left lower quadrant pain for now. She may come in to discuss her lower back pain symptoms and will order appropriate tests if indicated.

## 2017-05-24 NOTE — Telephone Encounter (Signed)
Pt called back w/ further questions regarding her labs, contact pt to advise

## 2017-05-24 NOTE — Telephone Encounter (Signed)
Pt. Notified.

## 2017-05-26 LAB — VITAMIN D 25 HYDROXY (VIT D DEFICIENCY, FRACTURES): VIT D 25 HYDROXY: 33 ng/mL (ref 30.0–100.0)

## 2017-05-26 LAB — SPECIMEN STATUS REPORT

## 2017-05-26 LAB — HEPATITIS C ANTIBODY: Hep C Virus Ab: 0.1 s/co ratio (ref 0.0–0.9)

## 2017-05-29 NOTE — Telephone Encounter (Signed)
Visit complete.

## 2017-05-30 ENCOUNTER — Telehealth: Payer: Self-pay

## 2017-05-30 NOTE — Telephone Encounter (Signed)
Copied from [place] [phone]. Topic: Inquiry >> May 30, 2017  1:26 PM Margot Ables wrote: Reason for CRM: needs clarification on the MRI for tomorrow 2/14 >> May 30, 2017  2:55 PM Arletha Grippe wrote: The order is incorrect  - madison from [facility] [employer] called - they needs to have clarification on this order.  They can not give the pt instruction until order is clarified.  Cb# [phone]   I tried to contact [place] back to see how I can be of assistance but there was no answer and I was not able to leave a message.

## 2017-05-30 NOTE — Telephone Encounter (Signed)
The order for the MRI is for left lower quadrant abdominal pain. Please advise

## 2017-05-31 ENCOUNTER — Ambulatory Visit: Admission: RE | Admit: 2017-05-31 | Payer: Medicare Other | Source: Ambulatory Visit

## 2017-05-31 ENCOUNTER — Telehealth: Payer: Self-pay | Admitting: Family Medicine

## 2017-05-31 ENCOUNTER — Other Ambulatory Visit: Payer: Self-pay

## 2017-05-31 DIAGNOSIS — R1032 Left lower quadrant pain: Secondary | ICD-10-CM

## 2017-05-31 NOTE — Telephone Encounter (Signed)
Copied from [place]. Topic: Inquiry >> May 31, 2017  8:08 AM Corie Chiquito, [place] wrote: Reason for CRM: [facility] called because the radiologist informed them that the patient would need to have another order if Dr.Shah wants her to have an MRI done. Stated that the order that she has now is normally used for a CT Scan only.Patient will be seen today in there office at 1 pm  If someone could give her a call back about this at [phone]

## 2017-05-31 NOTE — Telephone Encounter (Signed)
MRI has been changed by  our referral coordinator latisha richmond and signed by Dr. Manuella Ghazi and fax to the number that was provided to [facility].

## 2017-06-08 ENCOUNTER — Ambulatory Visit: Payer: Medicare Other

## 2017-06-08 NOTE — Telephone Encounter (Signed)
Can you look into this, it was a former Dr. Manuella Ghazi pt and this is what he order? But they feel like this is not an acceptable dx?

## 2017-06-08 NOTE — Telephone Encounter (Signed)
I reviewed the last office note I reviewed the CT scan that she had done in January 2018 I don't see an indication for an MRI I'd like to see her in the office to discuss and examine her to see if she really needs another CT scan or if another test or referral is more appropriate Thank you

## 2017-06-08 NOTE — Telephone Encounter (Signed)
[facility] called [phone] Rebecca Anthony  They have some questions about this order.  They do not feel it is appropriatefor the diagnosis .  They feel a CT is more appropriate Pt has appt for mri on Monday

## 2017-06-11 ENCOUNTER — Telehealth: Payer: Self-pay | Admitting: Family Medicine

## 2017-06-11 ENCOUNTER — Ambulatory Visit
Admission: RE | Admit: 2017-06-11 | Discharge: 2017-06-11 | Disposition: A | Discharge status: Home / Self Care | Payer: Medicare Other | Source: Ambulatory Visit | Attending: Family Medicine | Admitting: Family Medicine

## 2017-06-11 NOTE — Telephone Encounter (Signed)
Per the patient she did not understand why this test could not be scheduled for she says that the dr and her had spoke of this issue several times and it was even in the My Chart. Says that he told her since they had done everything they could and that the MRI was the last thing. I tried to explain to her what Dr Sanda Klein had told Cassandra but she is asking that Dr. Sanda Klein give her a call as soon as she could.

## 2017-06-11 NOTE — Telephone Encounter (Signed)
Pt notified for ins purposes she would need an appt for more documentation

## 2017-06-11 NOTE — Telephone Encounter (Signed)
Up front staff has notified pt

## 2017-06-20 ENCOUNTER — Ambulatory Visit: Payer: Medicare Other

## 2017-07-16 ENCOUNTER — Ambulatory Visit (INDEPENDENT_AMBULATORY_CARE_PROVIDER_SITE_OTHER): Payer: Medicare Other | Admitting: Family Medicine

## 2017-07-16 ENCOUNTER — Encounter: Payer: Self-pay | Admitting: Family Medicine

## 2017-07-16 VITALS — BP 132/78 | HR 96 | Temp 98.6°F | Ht 69.0 in | Wt 215.0 lb

## 2017-07-16 DIAGNOSIS — R1032 Left lower quadrant pain: Secondary | ICD-10-CM

## 2017-07-16 DIAGNOSIS — M85852 Other specified disorders of bone density and structure, left thigh: Secondary | ICD-10-CM

## 2017-07-16 DIAGNOSIS — I10 Essential (primary) hypertension: Secondary | ICD-10-CM | POA: Diagnosis not present

## 2017-07-16 DIAGNOSIS — E039 Hypothyroidism, unspecified: Secondary | ICD-10-CM | POA: Diagnosis not present

## 2017-07-16 DIAGNOSIS — R3129 Other microscopic hematuria: Secondary | ICD-10-CM | POA: Diagnosis not present

## 2017-07-16 NOTE — Patient Instructions (Addendum)
Consider seeing a pain doctor for suspected musculoskeletal pain on the left side We'll have you see the urologist Let me know if anything changes Try taking the MIralax every day I will be glad to order an MRI and go to bat for you if anything changes or you would like that scan Please request the actual DEXA report with T-score from November 2017

## 2017-07-16 NOTE — Progress Notes (Signed)
BP 132/78 (BP Location: Right Arm, Patient Position: Sitting, Cuff Size: Large)   Pulse 96   Temp 98.6 F (37 C) (Oral)   Ht 5\' 9"  (1.753 m)   Wt 215 lb (97.5 kg)   SpO2 98%   BMI 31.75 kg/m    Subjective:    [name], female    DOB: 03/13/1946, 72 y.o.   MRN: [id number]  HPI: Rebecca Anthony is a 72 y.o. female  Chief Complaint  Patient presents with  . Follow-up    HPI Patient is new to me; previous provider left our practice  She was having nagging LLQ pain, one area that keeps bothering her; sometimes moves through her stomach area, like it is not staying there, moves a little to the right; feels some irritation back there; still likes to work out in the yard; going on for a year; she had called the MRI; had the [place] She has diverticulosis; colonoscopy with Dr. Allen Norris in 2015; cologuard Hysterectomy, still has ovaries; she had a salpinx cyst, maybe right; years ago in [place]; ovaries reviewed in CT scan Jan 2018 Hysterectomy around age 58-50; she had fibroids No night sweats, no unexplained weight loss; did go through a short period of time feeling flushed, but that didn't last long; took estradiol but stopped it; stopped it No mucous in the stool; no blood in the stool now; did have some constipation months ago and had some blood with stool, just when wiping; in between bright red and darker blood Started like a sore area, she recalls, pointing over the LLQ; doctor would press and it would hurt; when she walks, she leans husband says; she had an operation on her left foot, tendon repair, hammartoe at the time, gait is still off, result of the tendon It feels better after going to the bathroom  CT scan Jan 2018: Musculoskeletal: Levoconvex curvature of the lumbar spine is centered at L2. Compensatory rightward curvature is evident at L4-5 and L5-S1. Asymmetric left-sided endplate and facet degenerative changes are present at L3-4, L4-5, and  L5-S1.  IMPRESSION: 1. No acute or focal lesion to explain the patient's symptoms. 2. Sigmoid diverticulosis without evidence for diverticulitis. 3. Atherosclerosis without evidence aneurysm. 4. Hysterectomy and appendectomy. 5. Scoliosis with degenerative changes and stenosis in the lumbar spine.   Electronically Signed   By: San Morelle M.D.   On: 04/19/2016 16:37   Mother had diabetes and was going to go on dialysis; died at age 50 years old Renal US July 2018: EXAM: RENAL / URINARY TRACT ULTRASOUND COMPLETE  COMPARISON:  None.  FINDINGS: Right Kidney:  Length: 10.3 cm. Echogenicity within normal limits. No mass or hydronephrosis visualized.  Left Kidney:  Length: 10.8 cm. Echogenicity within normal limits. No mass or hydronephrosis visualized.  Bladder:  Appears normal for degree of bladder distention.  IMPRESSION: No acute or focal abnormality.   Electronically Signed   By: Marcello Moores  Register   On: 11/03/2016 15:49  Hx of blood in the urine; even back to Dr. Rutherford Nail years ago; moderate blood one year ago; no blood last visit; no hx of kidney stones; no problems with the legs  She stopped estradiol  She has gone back and forth on the PPI; acid reflux, not on any more  Hypothyroidism; on low dose thyroid med; went to the ENT to get her ears checked; doctor there said she had a thyroid problem Lab Results  Component Value Date   TSH 2.90  02/23/2017   Osteopenia; on alendronate; asked length of time  Depression screen [facility] 2/9 07/16/2017 05/17/2017 02/14/2017 01/29/2017 09/06/2016  Decreased Interest 0 0 0 0 0  Down, Depressed, Hopeless 0 0 0 0 -  PHQ - 2 Score 0 0 0 0 0   Relevant past medical, surgical, family and social history reviewed Past Medical History:  Diagnosis Date  . Dysuria   . Heart murmur   . Hematuria, unspecified   . History of shingles   . History of swelling of feet   . Hypertension   . Irritable bowel  syndrome   . Lichenification and lichen simplex chronicus   . Localized superficial swelling, mass, or lump   . Osteoarthrosis, unspecified whether generalized or localized, unspecified site   . Osteoporosis, unspecified   . PAF (paroxysmal atrial fibrillation) ([place])    Diagnosed years ago -- originally on Quinidine  . Sinus problem   . Synovial cyst, unspecified    Past Surgical History:  Procedure Laterality Date  . ANKLE SURGERY Left   . APPENDECTOMY    . AV FISTULA REPAIR    . FOOT SURGERY Left   . VAGINAL HYSTERECTOMY    . VESICOVAGINAL FISTULA CLOSURE W/ TAH     Family History  Problem Relation Age of Onset  . Diabetes Mother   . Hypertension Mother   . Heart failure Mother   . High blood pressure Father   . Heart disease Father   . Hypertension Father   . Thyroid disease Sister   . Breast cancer Neg Hx    Social History   Tobacco Use  . Smoking status: Never Smoker  . Smokeless tobacco: Never Used  Substance Use Topics  . Alcohol use: No  . Drug use: No    Interim medical history since last visit reviewed. Allergies and medications reviewed  Review of Systems Per HPI unless specifically indicated above     Objective:    BP 132/78 (BP Location: Right Arm, Patient Position: Sitting, Cuff Size: Large)   Pulse 96   Temp 98.6 F (37 C) (Oral)   Ht 5\' 9"  (1.753 m)   Wt 215 lb (97.5 kg)   SpO2 98%   BMI 31.75 kg/m   Wt Readings from Last 3 Encounters:  07/16/17 215 lb (97.5 kg)  05/17/17 218 lb (98.9 kg)  02/23/17 219 lb 6.4 oz (99.5 kg)    Physical Exam  Constitutional: She appears well-developed and well-nourished. No distress.  HENT:  Head: Normocephalic and atraumatic.  Eyes: EOM are normal. No scleral icterus.  Neck: No thyromegaly present.  Cardiovascular: Normal rate, regular rhythm and normal heart sounds.  No murmur heard. Pulmonary/Chest: Effort normal and breath sounds normal. No respiratory distress. She has no wheezes.  Abdominal:  Soft. Bowel sounds are normal. She exhibits no distension. There is tenderness (focal left of umbilicus; Positive Carnett's sign).  Musculoskeletal: Normal range of motion. She exhibits no edema.  Neurological: She is alert. She exhibits normal muscle tone.  Skin: Skin is warm and dry. She is not diaphoretic. No pallor.  Psychiatric: She has a normal mood and affect. Her behavior is normal. Judgment and thought content normal.    Results for orders placed or performed in visit on 07/16/17  Urinalysis w microscopic + reflex cultur  Result Value Ref Range   Color, Urine YELLOW YELLOW   APPearance CLEAR CLEAR   Specific Gravity, Urine 1.018 1.001 - 1.03   pH 6.0 5.0 - 8.0   Glucose,  UA NEGATIVE NEGATIVE   Bilirubin Urine NEGATIVE NEGATIVE   Ketones, ur NEGATIVE NEGATIVE   Hgb urine dipstick NEGATIVE NEGATIVE   Protein, ur NEGATIVE NEGATIVE   Nitrites, Initial NEGATIVE NEGATIVE   Leukocyte Esterase NEGATIVE NEGATIVE   WBC, UA NONE SEEN 0 - 5 /HPF   RBC / HPF NONE SEEN 0 - 2 /HPF   Squamous Epithelial / LPF NONE SEEN < OR = 5 /HPF   Bacteria, UA NONE SEEN NONE SEEN /HPF   Hyaline Cast NONE SEEN NONE SEEN /LPF  REFLEXIVE URINE CULTURE  Result Value Ref Range   Reflexve Urine Culture NO CULTURE INDICATED       Assessment & Plan:   Problem List Items Addressed This Visit      Cardiovascular and Mediastinum   Hypertension (Chronic)    Controlled, stable        Endocrine   Hypothyroidism (Chronic)    Chronic problem; stable; last tsh reviewed        Musculoskeletal and Integument   Osteopenia of left femoral neck    Would not want to continue bisphosphonate for five years; reviewed last DEXA        Genitourinary   Hematuria, microscopic    Check urine today; refer to urologist      Relevant Orders   Urinalysis w microscopic + reflex cultur (Completed)   Ambulatory referral to Urology     Other   LLQ abdominal pain - Primary    With positive Carnett's sign;  discussed likelihood of musculoskeletal source; could refer for injection to see if that blocks pain      Relevant Orders   Urinalysis w microscopic + reflex cultur (Completed)   Ambulatory referral to Urology       Follow up plan: Return in about 6 months (around 01/15/2018) for follow-up visit with Dr. Sanda Klein; sooner if symptoms persist.  An after-visit summary was printed and given to the patient at [place].  Please see the patient instructions which may contain other information and recommendations beyond what is mentioned above in the assessment and plan.  No orders of the defined types were placed in this encounter.   Orders Placed This Encounter  Procedures  . Urinalysis w microscopic + reflex cultur  . REFLEXIVE URINE CULTURE  . Ambulatory referral to Urology

## 2017-07-16 NOTE — Assessment & Plan Note (Signed)
Check urine today; refer to urologist

## 2017-07-17 LAB — URINALYSIS W MICROSCOPIC + REFLEX CULTURE
BACTERIA UA: NONE SEEN /HPF
Bilirubin Urine: NEGATIVE
Glucose, UA: NEGATIVE
HYALINE CAST: NONE SEEN /LPF
Hgb urine dipstick: NEGATIVE
KETONES UR: NEGATIVE
Leukocyte Esterase: NEGATIVE
Nitrites, Initial: NEGATIVE
PH: 6 (ref 5.0–8.0)
Protein, ur: NEGATIVE
RBC / HPF: NONE SEEN /HPF (ref 0–2)
SQUAMOUS EPITHELIAL / LPF: NONE SEEN /HPF (ref <=5)
Specific Gravity, Urine: 1.018 (ref 1.001–1.03)
WBC, UA: NONE SEEN /HPF (ref 0–5)

## 2017-07-17 LAB — NO CULTURE INDICATED

## 2017-07-19 ENCOUNTER — Other Ambulatory Visit: Payer: Self-pay | Admitting: Family Medicine

## 2017-07-19 DIAGNOSIS — I499 Cardiac arrhythmia, unspecified: Secondary | ICD-10-CM

## 2017-07-19 NOTE — Telephone Encounter (Signed)
Pt called in, pt says that she is down to 2 pills. She forgot to make sure of refill at ov on Monday.

## 2017-07-19 NOTE — Telephone Encounter (Signed)
Thank you I just sent in a year's worth of refills

## 2017-07-20 NOTE — Telephone Encounter (Signed)
lvm informing that prescription has been sent to pharmacy 

## 2017-07-28 NOTE — Assessment & Plan Note (Signed)
Controlled, stable.

## 2017-07-28 NOTE — Assessment & Plan Note (Signed)
Would not want to continue bisphosphonate for five years; reviewed last DEXA

## 2017-07-28 NOTE — Assessment & Plan Note (Signed)
With positive Carnett's sign; discussed likelihood of musculoskeletal source; could refer for injection to see if that blocks pain

## 2017-07-28 NOTE — Assessment & Plan Note (Signed)
Chronic problem; stable; last tsh reviewed

## 2017-08-02 ENCOUNTER — Ambulatory Visit: Payer: Medicare Other | Admitting: Urology

## 2017-08-23 ENCOUNTER — Ambulatory Visit: Payer: Medicare Other | Admitting: Urology

## 2017-08-23 ENCOUNTER — Encounter: Payer: Self-pay | Admitting: Urology

## 2017-08-23 VITALS — BP 153/92 | HR 80 | Ht 69.0 in | Wt 217.2 lb

## 2017-08-23 DIAGNOSIS — R1032 Left lower quadrant pain: Secondary | ICD-10-CM

## 2017-08-23 LAB — URINALYSIS, COMPLETE
BILIRUBIN UA: NEGATIVE
GLUCOSE, UA: NEGATIVE
KETONES UA: NEGATIVE
LEUKOCYTES UA: NEGATIVE
NITRITE UA: NEGATIVE
Protein, UA: NEGATIVE
Urobilinogen, Ur: 0.2 mg/dL (ref 0.2–1.0)
pH, UA: 5 (ref 5.0–7.5)

## 2017-08-23 LAB — MICROSCOPIC EXAMINATION: WBC, UA: NONE SEEN /hpf (ref 0–5)

## 2017-08-23 NOTE — Progress Notes (Signed)
08/23/2017 2:41 PM   [name] February 28, 1946 [id number]  Referring provider: Roselee Nova, MD [address] [place], [place] [address]  Chief Complaint  Patient presents with  . Hematuria    HPI: The patient is a 72 year old female who presents today with a chief complaint of possible microscopic hematuria and left lower quadrant pain.   In regards to blood in her urine, she has never had gross hematuria.  She had a negative urinalysis in April 2019 and July 2018.  She did have a positive dipstick for blood in October 2017, however this was not sent for microscopic evaluation.  Her urinalysis today is negative for microscopic hematuria.  Prior to this when she lived in [place], she stated that she had been told that there was blood in her urine but is unsure if this was under microscopic evaluation.  Currently, no dysuria or difficulty voiding.  No history of nephrolithiasis.  No smoking history.  The patient also has significant left lower quadrant pain.  This is been present for the last year.  She rates it as 5 out of 10 nagging pain.  Pushing on this area and movement make it worse.  Relaxing makes it better.  CT scan January 2018 was unremarkable from a GU perspective but did show diverticulosis.  She is a past surgical history significant for hysterectomy and appendectomy.   PMH: Past Medical History:  Diagnosis Date  . Dysuria   . Heart murmur   . Hematuria, unspecified   . History of shingles   . History of swelling of feet   . Hypertension   . Irritable bowel syndrome   . Lichenification and lichen simplex chronicus   . Localized superficial swelling, mass, or lump   . Osteoarthrosis, unspecified whether generalized or localized, unspecified site   . Osteoporosis, unspecified   . PAF (paroxysmal atrial fibrillation) ([place])    Diagnosed years ago -- originally on Quinidine  . Sinus problem   . Synovial cyst, unspecified     Surgical  History: Past Surgical History:  Procedure Laterality Date  . ANKLE SURGERY Left   . APPENDECTOMY    . AV FISTULA REPAIR    . FOOT SURGERY Left   . VAGINAL HYSTERECTOMY    . VESICOVAGINAL FISTULA CLOSURE W/ TAH      Home Medications:  Allergies as of 08/23/2017      Reactions   Sulfa Antibiotics    Anaprox [naproxen Sodium] Rash      Medication List        Accurate as of 08/23/17  2:41 PM. Always use your most recent med list.          alendronate 70 MG tablet Commonly known as:  FOSAMAX Take 1 tablet (70 mg total) by mouth once a week. Take with a full glass of water on an empty stomach.   aspirin 81 MG tablet Take 1 tablet (81 mg total) by mouth daily.   calcium carbonate 1500 (600 Ca) MG Tabs tablet Commonly known as:  OSCAL Take by mouth 2 (two) times daily with a meal.   levothyroxine 25 MCG tablet Commonly known as:  SYNTHROID, LEVOTHROID Take 1 tablet by mouth daily.   metoprolol succinate 50 MG 24 hr tablet Commonly known as:  TOPROL-XL TAKE 1 TABLET BY MOUTH EVERY DAY   polyethylene glycol powder powder Commonly known as:  GLYCOLAX/MIRALAX Take 17 g by mouth daily. Dissolve 1 capful in 8 oz of liquid daily PRN  vitamin C 500 MG tablet Commonly known as:  ASCORBIC ACID Take 500 mg by mouth daily.       Allergies:  Allergies  Allergen Reactions  . Sulfa Antibiotics   . Anaprox [Naproxen Sodium] Rash    Family History: Family History  Problem Relation Age of Onset  . Diabetes Mother   . Hypertension Mother   . Heart failure Mother   . High blood pressure Father   . Heart disease Father   . Hypertension Father   . Thyroid disease Sister   . Breast cancer Neg Hx     Social History:  reports that she has never smoked. She has never used smokeless tobacco. She reports that she does not drink alcohol or use drugs.  ROS: UROLOGY Frequent Urination?: No Hard to postpone urination?: Yes Burning/pain with urination?: No Get up at night to  urinate?: Yes Leakage of urine?: No Urine stream starts and stops?: No Trouble starting stream?: No Do you have to strain to urinate?: No Blood in urine?: No Urinary tract infection?: No Sexually transmitted disease?: No Injury to kidneys or bladder?: No Painful intercourse?: No Weak stream?: No Currently pregnant?: No Vaginal bleeding?: No Last menstrual period?: n  Gastrointestinal Nausea?: No Vomiting?: No Indigestion/heartburn?: No Diarrhea?: No Constipation?: Yes  Constitutional Fever: No Night sweats?: No Weight loss?: No Fatigue?: Yes  Skin Skin rash/lesions?: No Itching?: Yes  Eyes Blurred vision?: No Double vision?: No  Ears/Nose/Throat Sore throat?: No Sinus problems?: Yes  Hematologic/Lymphatic Swollen glands?: No Easy bruising?: Yes  Cardiovascular Leg swelling?: No Chest pain?: No  Respiratory Cough?: No Shortness of breath?: No  Endocrine Excessive thirst?: No  Musculoskeletal Back pain?: No Joint pain?: No  Neurological Headaches?: No Dizziness?: No  Psychologic Depression?: No Anxiety?: No  Physical Exam: BP (!) 153/92 (BP Location: Right Arm, Patient Position: Sitting, Cuff Size: Large)   Pulse 80   Ht 5\' 9"  (1.753 m)   Wt 217 lb 3.2 oz (98.5 kg)   BMI 32.07 kg/m   Constitutional:  Alert and oriented, No acute distress. HEENT: [place] AT, moist mucus membranes.  Trachea midline, no masses. Cardiovascular: No clubbing, cyanosis, or edema. Respiratory: Normal respiratory effort, no increased work of breathing. GI: Abdomen is soft, nontender, nondistended, no abdominal masses GU: No CVA tenderness.  Mild tenderness palpation in the left lower quadrant.  No CVA tenderness.  No suprapubic tenderness. Skin: No rashes, bruises or suspicious lesions. Lymph: No cervical or inguinal adenopathy. Neurologic: Grossly intact, no focal deficits, moving all 4 extremities. Psychiatric: Normal mood and affect.  Laboratory Data: Lab  Results  Component Value Date   WBC 5.1 05/17/2017   HGB 13.5 05/17/2017   HCT 41.6 05/17/2017   MCV 86 05/17/2017   PLT 294 05/17/2017    Lab Results  Component Value Date   CREATININE 0.87 05/17/2017    No results found for: PSA  No results found for: TESTOSTERONE  Lab Results  Component Value Date   HGBA1C 5.7 11/25/2015    Urinalysis    Component Value Date/Time   COLORURINE YELLOW 07/16/2017 1551   APPEARANCEUR CLEAR 07/16/2017 1551   LABSPEC 1.018 07/16/2017 1551   PHURINE 6.0 07/16/2017 1551   GLUCOSEU NEGATIVE 07/16/2017 1551   HGBUR NEGATIVE 07/16/2017 1551   BILIRUBINUR neg 10/27/2016 1530   KETONESUR NEGATIVE 07/16/2017 1551   PROTEINUR NEGATIVE 07/16/2017 1551   UROBILINOGEN 0.2 10/27/2016 1530   NITRITE neg 10/27/2016 1530   LEUKOCYTESUR Negative 10/27/2016 1530    Pertinent  Imaging: CT from January 2018 reviewed.  No nephrolithiasis or urological abnormalities.  Assessment & Plan:    1.  Left lower quadrant pain -I discussed with the patient that her symptoms are not consistent with any urological source of her pain.  Favor musculoskeletal in origin.  She did also have lumbar stenosis and degenerative changes along with scoliosis in her January 2018 CT which may also be contributing to this pain.  As her pain is not urological in origin, she will discuss further work-up with her PCP. -Review of the patient's urinalysis in Epic back through October 2017 show no evidence of microscopic hematuria.  The sample from October 2017 that showed blood on the dipstick cannot be used to diagnose microscopic hematuria as it was never sent off for microscopic evaluation.  As of now I see no evidence of hematuria when her urine has been microscopically evaluated.  She will follow-up with [place] as needed if she does have a microscopic evaluation of her urine showing blood.  Return if symptoms worsen or fail to improve.  Nickie Retort, MD  [facility] [address] [place], [place] [address] [phone]

## 2017-08-30 ENCOUNTER — Ambulatory Visit: Payer: Medicare Other | Admitting: Family Medicine

## 2017-09-13 ENCOUNTER — Ambulatory Visit: Payer: Medicare Other | Admitting: Family Medicine

## 2017-09-13 ENCOUNTER — Encounter: Payer: Self-pay | Admitting: Family Medicine

## 2017-09-13 ENCOUNTER — Encounter

## 2017-09-13 VITALS — BP 132/84 | HR 88 | Temp 98.5°F | Resp 12 | Ht 69.0 in | Wt 218.7 lb

## 2017-09-13 DIAGNOSIS — I480 Paroxysmal atrial fibrillation: Secondary | ICD-10-CM

## 2017-09-13 DIAGNOSIS — R109 Unspecified abdominal pain: Secondary | ICD-10-CM | POA: Diagnosis not present

## 2017-09-13 DIAGNOSIS — R1032 Left lower quadrant pain: Secondary | ICD-10-CM

## 2017-09-13 DIAGNOSIS — E66811 Obesity, class 1: Secondary | ICD-10-CM | POA: Insufficient documentation

## 2017-09-13 DIAGNOSIS — E669 Obesity, unspecified: Secondary | ICD-10-CM

## 2017-09-13 MED ORDER — RIVAROXABAN 20 MG PO TABS: 20.0000 mg | ORAL_TABLET | Freq: Every day | ORAL | 5 refills | Status: DC

## 2017-09-13 MED ORDER — ASPIRIN 81 MG PO TABS: 81.0000 mg | ORAL_TABLET | Freq: Every day | ORAL | Status: DC

## 2017-09-13 NOTE — Assessment & Plan Note (Signed)
Encouraged weight loss; see AVS 

## 2017-09-13 NOTE — Progress Notes (Signed)
BP 132/84   Pulse 88   Temp 98.5 F (36.9 C) (Oral)   Resp 12   Ht 5\' 9"  (1.753 m)   Wt 218 lb 11.2 oz (99.2 kg)   SpO2 95%   BMI 32.30 kg/m    Subjective:    [name], female    DOB: 05-10-1945, 72 y.o.   MRN: [id number]  HPI: Rebecca Anthony is a 72 y.o. female  Chief Complaint  Patient presents with  . Follow-up    discuss eye surgery coming up, also ted hose for varicose veins    HPI Patient is here for follow-up  She is going to have eye surgery soon; plan for cataract extraction on 10/09/17; will see Dr. George Ina on 09/25/17 for measurements, but rescheduled to 10/02/17  Hypertension; she is on metoprolol succinate 50 mg daily; BP even better at home  She has intermittent atrial fibrillation; on beta-blocker; was told for years that she had a strange heartbeat; they did an echo and that didn't show anything  She has hypothyroidism Lab Results  Component Value Date   TSH 2.90 02/23/2017   Microscopic hematuria; referred at last visit to urologist; saw Dr. Pilar Jarvis on 08/23/17; mostly on the left side, sometimes on the right side; had renal US July 2018 Doesn't keep her from doing most of the things she wants to do; sometimes tired  She continues to have discomfort, left of the midline, persistent for years; off an on; had colonoscopy a while back and her heart rate went up and she saw cardiologist, Dr. Ellyn Hack; did not wear the longer heart monitor; one aspirin and metoprolol  Depression screen [facility] 2/9 09/13/2017 09/13/2017 07/16/2017 05/17/2017 02/14/2017  Decreased Interest 0 0 0 0 0  Down, Depressed, Hopeless 0 0 0 0 0  PHQ - 2 Score 0 0 0 0 0  Altered sleeping 0 - - - -  Tired, decreased energy 1 - - - -  Change in appetite 0 - - - -  Feeling bad or failure about yourself  0 - - - -  Trouble concentrating 0 - - - -  Moving slowly or fidgety/restless 0 - - - -  Suicidal thoughts 0 - - - -  PHQ-9 Score 1 - - - -  Difficult doing work/chores Not  difficult at all - - - -    Relevant past medical, surgical, family and social history reviewed Past Medical History:  Diagnosis Date  . Dysuria   . Heart murmur   . Hematuria, unspecified   . History of shingles   . History of swelling of feet   . Hypertension   . Irritable bowel syndrome   . Lichenification and lichen simplex chronicus   . Localized superficial swelling, mass, or lump   . Osteoarthrosis, unspecified whether generalized or localized, unspecified site   . Osteoporosis, unspecified   . PAF (paroxysmal atrial fibrillation) ([place])    Diagnosed years ago -- originally on Quinidine  . Sinus problem   . Synovial cyst, unspecified    Past Surgical History:  Procedure Laterality Date  . ANKLE SURGERY Left   . APPENDECTOMY    . AV FISTULA REPAIR    . FOOT SURGERY Left   . VAGINAL HYSTERECTOMY    . VESICOVAGINAL FISTULA CLOSURE W/ TAH     Family History  Problem Relation Age of Onset  . Diabetes Mother   . Hypertension Mother   . Heart failure Mother   .  High blood pressure Father   . Heart disease Father   . Hypertension Father   . Thyroid disease Sister   . Breast cancer Neg Hx    Social History   Tobacco Use  . Smoking status: Never Smoker  . Smokeless tobacco: Never Used  Substance Use Topics  . Alcohol use: No  . Drug use: No    Interim medical history since last visit reviewed. Allergies and medications reviewed  Review of Systems Per HPI unless specifically indicated above     Objective:    BP 132/84   Pulse 88   Temp 98.5 F (36.9 C) (Oral)   Resp 12   Ht 5\' 9"  (1.753 m)   Wt 218 lb 11.2 oz (99.2 kg)   SpO2 95%   BMI 32.30 kg/m   Wt Readings from Last 3 Encounters:  09/13/17 218 lb 11.2 oz (99.2 kg)  08/23/17 217 lb 3.2 oz (98.5 kg)  07/16/17 215 lb (97.5 kg)    Physical Exam  Constitutional: She appears well-developed and well-nourished. No distress.  HENT:  Head: Normocephalic and atraumatic.  Eyes: EOM are normal. No  scleral icterus.  Neck: No thyromegaly present.  Cardiovascular: Normal rate, regular rhythm and normal heart sounds.  No murmur heard. Pulmonary/Chest: Effort normal and breath sounds normal. No respiratory distress. She has no wheezes.  Abdominal: Soft. Bowel sounds are normal. She exhibits no distension. There is tenderness (positive CARNETT's sign).  Musculoskeletal: Normal range of motion. She exhibits no edema.  Neurological: She is alert. She exhibits normal muscle tone.  Skin: Skin is warm and dry. She is not diaphoretic. No pallor.  Psychiatric: She has a normal mood and affect. Her behavior is normal. Judgment and thought content normal.       Assessment & Plan:   Problem List Items Addressed This Visit      Cardiovascular and Mediastinum   PAF (paroxysmal atrial fibrillation) (HCC) - Primary (Chronic)   Relevant Medications   rivaroxaban (XARELTO) 20 MG TABS tablet   aspirin 81 MG tablet   Other Relevant Orders   Ambulatory referral to Cardiology   EKG 12-Lead (Completed)     Other   LLQ abdominal pain   Relevant Orders   Ambulatory referral to Pain Clinic   Obesity (BMI 30.0-34.9)    Encouraged weight loss; see AVS       Other Visit Diagnoses    Abdominal wall pain       Relevant Orders   Ambulatory referral to Pain Clinic       Follow up plan: No follow-ups on file.  An after-visit summary was printed and given to the patient at [place].  Please see the patient instructions which may contain other information and recommendations beyond what is mentioned above in the assessment and plan.  Meds ordered this encounter  Medications  . rivaroxaban (XARELTO) 20 MG TABS tablet    Sig: Take 1 tablet (20 mg total) by mouth daily with supper.    Dispense:  30 tablet    Refill:  5  . aspirin 81 MG tablet    Sig: Take 1 tablet (81 mg total) by mouth daily. Do NOT take with Xarelto (just one or the other)    Dispense:  30 tablet    Orders Placed This  Encounter  Procedures  . Ambulatory referral to Cardiology  . Ambulatory referral to Pain Clinic  . EKG 12-Lead

## 2017-09-13 NOTE — Patient Instructions (Addendum)
I recommend that you stop the aspirin and start Xarelto to help prevent a stroke  If you decide to not start the Xarelto, continue aspirin until seven days prior to your cataract surgery (stop all blood thinners prior to cataract surgery) but do NOT take them together  We'll have you see the cardiologist and the pain clinic doctor Let me know your response to the treatment for the abdominal pain; we can continue work-up if needed  Try to follow the DASH guidelines (DASH stands for Dietary Approaches to Stop Hypertension). Try to limit the sodium in your diet to no more than 1,500mg  of sodium per day. Certainly try to not exceed 2,000 mg per day at the very most. Do not add salt when cooking or at the table.  Check the sodium amount on labels when shopping, and choose items lower in sodium when given a choice. Avoid or limit foods that already contain a lot of sodium. Eat a diet rich in fruits and vegetables and whole grains, and try to lose weight if overweight or obese  Check out the information at familydoctor.org entitled "Nutrition for Weight Loss: What You Need to Know about Fad Diets" Try to lose between 1-2 pounds per week by taking in fewer calories and burning off more calories You can succeed by limiting portions, limiting foods dense in calories and fat, becoming more active, and drinking 8 glasses of water a day (64 ounces) Don't skip meals, especially breakfast, as skipping meals may alter your metabolism Do not use over-the-counter weight loss pills or gimmicks that claim rapid weight loss A healthy BMI (or body mass index) is between 18.5 and 24.9 You can calculate your ideal BMI at the [place] website ClubMonetize.fr   DASH Eating Plan DASH stands for "Dietary Approaches to Stop Hypertension." The DASH eating plan is a healthy eating plan that has been shown to reduce high blood pressure (hypertension). It may also reduce your risk  for type 2 diabetes, heart disease, and stroke. The DASH eating plan may also help with weight loss. What are tips for following this plan? General guidelines  Avoid eating more than 2,300 mg (milligrams) of salt (sodium) a day. If you have hypertension, you may need to reduce your sodium intake to 1,500 mg a day.  Limit alcohol intake to no more than 1 drink a day for nonpregnant women and 2 drinks a day for men. One drink equals 12 oz of beer, 5 oz of wine, or 1 oz of hard liquor.  Work with your health care provider to maintain a healthy body weight or to lose weight. Ask what an ideal weight is for you.  Get at least 30 minutes of exercise that causes your heart to beat faster (aerobic exercise) most days of the week. Activities may include walking, swimming, or biking.  Work with your health care provider or diet and nutrition specialist (dietitian) to adjust your eating plan to your individual calorie needs. Reading food labels  Check food labels for the amount of sodium per serving. Choose foods with less than 5 percent of the Daily Value of sodium. Generally, foods with less than 300 mg of sodium per serving fit into this eating plan.  To find whole grains, look for the word "whole" as the first word in the ingredient list. Shopping  Buy products labeled as "low-sodium" or "no salt added."  Buy fresh foods. Avoid canned foods and premade or frozen meals. Cooking  Avoid adding salt when cooking. Use  salt-free seasonings or herbs instead of table salt or sea salt. Check with your health care provider or pharmacist before using salt substitutes.  Do not fry foods. Cook foods using healthy methods such as baking, boiling, grilling, and broiling instead.  Cook with heart-healthy oils, such as olive, canola, soybean, or sunflower oil. Meal planning   Eat a balanced diet that includes: ? 5 or more servings of fruits and vegetables each day. At each meal, try to fill half of your  plate with fruits and vegetables. ? Up to 6-8 servings of whole grains each day. ? Less than 6 oz of lean meat, poultry, or fish each day. A 3-oz serving of meat is about the same size as a deck of cards. One egg equals 1 oz. ? 2 servings of low-fat dairy each day. ? A serving of nuts, seeds, or beans 5 times each week. ? Heart-healthy fats. Healthy fats called Omega-3 fatty acids are found in foods such as flaxseeds and coldwater fish, like sardines, salmon, and mackerel.  Limit how much you eat of the following: ? Canned or prepackaged foods. ? Food that is high in trans fat, such as fried foods. ? Food that is high in saturated fat, such as fatty meat. ? Sweets, desserts, sugary drinks, and other foods with added sugar. ? Full-fat dairy products.  Do not salt foods before eating.  Try to eat at least 2 vegetarian meals each week.  Eat more home-cooked food and less restaurant, buffet, and fast food.  When eating at a restaurant, ask that your food be prepared with less salt or no salt, if possible. What foods are recommended? The items listed may not be a complete list. Talk with your dietitian about what dietary choices are best for you. Grains Whole-grain or whole-wheat bread. Whole-grain or whole-wheat pasta. Brown rice. [place]. Bulgur. Whole-grain and low-sodium cereals. Pita bread. Low-fat, low-sodium crackers. Whole-wheat flour tortillas. Vegetables Fresh or frozen vegetables (raw, steamed, roasted, or grilled). Low-sodium or reduced-sodium tomato and vegetable juice. Low-sodium or reduced-sodium tomato sauce and tomato paste. Low-sodium or reduced-sodium canned vegetables. Fruits All fresh, dried, or frozen fruit. Canned fruit in natural juice (without added sugar). Meat and other protein foods Skinless chicken or [place]. Ground chicken or [place]. Pork with fat trimmed off. Fish and seafood. Egg whites. Dried beans, peas, or lentils. Unsalted nuts, nut butters, and  seeds. Unsalted canned beans. Lean cuts of beef with fat trimmed off. Low-sodium, lean deli meat. Dairy Low-fat (1%) or fat-free (skim) milk. Fat-free, low-fat, or reduced-fat cheeses. Nonfat, low-sodium ricotta or cottage cheese. Low-fat or nonfat yogurt. Low-fat, low-sodium cheese. Fats and oils Soft margarine without trans fats. Vegetable oil. Low-fat, reduced-fat, or light mayonnaise and salad dressings (reduced-sodium). Canola, safflower, olive, soybean, and sunflower oils. Avocado. Seasoning and other foods Herbs. Spices. Seasoning mixes without salt. Unsalted popcorn and pretzels. Fat-free sweets. What foods are not recommended? The items listed may not be a complete list. Talk with your dietitian about what dietary choices are best for you. Grains Baked goods made with fat, such as croissants, muffins, or some breads. Dry pasta or rice meal packs. Vegetables Creamed or fried vegetables. Vegetables in a cheese sauce. Regular canned vegetables (not low-sodium or reduced-sodium). Regular canned tomato sauce and paste (not low-sodium or reduced-sodium). Regular tomato and vegetable juice (not low-sodium or reduced-sodium). Angie Fava. Olives. Fruits Canned fruit in a light or heavy syrup. Fried fruit. Fruit in cream or butter sauce. Meat and other protein foods Fatty  cuts of meat. Ribs. Fried meat. Berniece Salines. Sausage. Bologna and other processed lunch meats. Salami. Fatback. Hotdogs. Bratwurst. Salted nuts and seeds. Canned beans with added salt. Canned or smoked fish. Whole eggs or egg yolks. Chicken or [place] with skin. Dairy Whole or 2% milk, cream, and half-and-half. Whole or full-fat cream cheese. Whole-fat or sweetened yogurt. Full-fat cheese. Nondairy creamers. Whipped toppings. Processed cheese and cheese spreads. Fats and oils Butter. Stick margarine. Lard. Shortening. Ghee. Bacon fat. Tropical oils, such as coconut, palm kernel, or palm oil. Seasoning and other foods Salted popcorn and  pretzels. Onion salt, garlic salt, seasoned salt, table salt, and sea salt. Worcestershire sauce. Tartar sauce. Barbecue sauce. Teriyaki sauce. Soy sauce, including reduced-sodium. Steak sauce. Canned and packaged gravies. Fish sauce. Oyster sauce. Cocktail sauce. Horseradish that you find on the shelf. Ketchup. Mustard. Meat flavorings and tenderizers. Bouillon cubes. Hot sauce and Tabasco sauce. Premade or packaged marinades. Premade or packaged taco seasonings. Relishes. Regular salad dressings. Where to find more information:  National Heart, Lung, and [place]: https://wilson-eaton.com/  American Heart Association: www.heart.org Summary  The DASH eating plan is a healthy eating plan that has been shown to reduce high blood pressure (hypertension). It may also reduce your risk for type 2 diabetes, heart disease, and stroke.  With the DASH eating plan, you should limit salt (sodium) intake to 2,300 mg a day. If you have hypertension, you may need to reduce your sodium intake to 1,500 mg a day.  When on the DASH eating plan, aim to eat more fresh fruits and vegetables, whole grains, lean proteins, low-fat dairy, and heart-healthy fats.  Work with your health care provider or diet and nutrition specialist (dietitian) to adjust your eating plan to your individual calorie needs. This information is not intended to replace advice given to you by your health care provider. Make sure you discuss any questions you have with your health care provider. Document Released: 03/23/2011 Document Revised: 03/27/2016 Document Reviewed: 03/27/2016 Elsevier Interactive Patient Education  2018 [place]  Warning Signs of a Stroke A stroke is a medical emergency and should be treated right away-every second counts. A stroke is caused by a decrease or block in blood flow to the brain. When this occurs, certain areas of the brain do not get enough oxygen, and brain cells begin to die. A stroke can lead to brain  damage and can sometimes be life-threatening. However, if someone having a stroke gets medical treatment right away, he or she has better chances of surviving and recovering from the stroke. Being able to recognize the symptoms of a stroke is very important. Types of strokes There are two main types of strokes:  Ischemic strokes. This is the most common type of stroke. These strokes happen when a blood vessel that supplies blood to the brain is being blocked.  Hemorrhagic strokes. These strokes result from bleeding in the brain due to a blood vessel leaking or bursting (rupturing).  A transient ischemic attack (TIA) is a "warning stroke" that causes stroke-like symptoms that go away quickly. Unlike a stroke, a TIA does not cause permanent damage to the brain. However, the symptoms of a TIA are the same as a stroke, and they also require medical treatment right away. Having a TIA is a sign that you are at higher risk for a permanent stroke. Warning signs of a stroke The symptoms of stroke may vary and will reflect the part of the brain that is involved. Symptoms usually happen suddenly. "  BE FAST" is an easy way to remember the main warning signs of a stroke. B - Balance Signs are dizziness, sudden trouble walking, or loss of balance. E - Eyes Signs are trouble seeing or a sudden change in vision. F - Face Signs are sudden weakness or numbness of the face, or the face or eyelid drooping on one side. A - Arms Signs are weakness or numbness in an arm. This happens suddenly and usually on one side of the body. S - Speech Signs are sudden trouble speaking, slurred speech, or trouble understanding what people say. T - Time Time to call emergency services. Write down what time symptoms started. Other signs of a stroke Some less common signs of a stroke include:  A sudden, severe headache with no known cause.  Nausea or vomiting.  Seizure.  A stroke may be happening even if only one "BE FAST"  symptoms is present. These symptoms may represent a serious problem that is an emergency. Do not wait to see if the symptoms will go away. Get medical help right away. Call your local emergency services (911 in the [place] Do not drive yourself to the hospital. Summary  A stroke is a medical emergency and should be treated right away-every second counts.  "BE FAST" is an easy way to remember the main warning signs of a stroke.  Call local emergency services right away if you or someone else has any stroke symptoms, even if the symptoms go away.  Make note of what time the first symptoms appeared. Emergency responders or emergency room staff will need to know this information.  Do not wait to see if symptoms will go away. Call 911 even if only one of the "BE FAST" symptoms appears. This information is not intended to replace advice given to you by your health care provider. Make sure you discuss any questions you have with your health care provider. Document Released: 07/21/2016 Document Revised: 07/21/2016 Document Reviewed: 07/21/2016 Elsevier Interactive Patient Education  2018 [employer].  Stroke Prevention Some medical conditions and behaviors are associated with a higher chance of having a stroke. You can help prevent a stroke by making nutrition, lifestyle, and other changes, including managing any medical conditions you may have. What nutrition changes can be made?  Eat healthy foods. You can do this by: ? Choosing foods high in fiber, such as fresh fruits and vegetables and whole grains. ? Eating at least 5 or more servings of fruits and vegetables a day. Try to fill half of your plate at each meal with fruits and vegetables. ? Choosing lean protein foods, such as lean cuts of meat, poultry without skin, fish, tofu, beans, and nuts. ? Eating low-fat dairy products. ? Avoiding foods that are high in salt (sodium). This can help lower blood pressure. ? Avoiding foods that have  saturated fat, trans fat, and cholesterol. This can help prevent high cholesterol. ? Avoiding processed and premade foods.  Follow your health care provider's specific guidelines for losing weight, controlling high blood pressure (hypertension), lowering high cholesterol, and managing diabetes. These may include: ? Reducing your daily calorie intake. ? Limiting your daily sodium intake to 1,500 milligrams (mg). ? Using only healthy fats for cooking, such as olive oil, canola oil, or sunflower oil. ? Counting your daily carbohydrate intake. What lifestyle changes can be made?  Maintain a healthy weight. Talk to your health care provider about your ideal weight.  Get at least 30 minutes of moderate physical activity  at least 5 days a week. Moderate activity includes brisk walking, biking, and swimming.  Do not use any products that contain nicotine or tobacco, such as cigarettes and e-cigarettes. If you need help quitting, ask your health care provider. It may also be helpful to avoid exposure to secondhand smoke.  Limit alcohol intake to no more than 1 drink a day for nonpregnant women and 2 drinks a day for men. One drink equals 12 oz of beer, 5 oz of wine, or 1 oz of hard liquor.  Stop any illegal drug use.  Avoid taking birth control pills. Talk to your health care provider about the risks of taking birth control pills if: ? You are over 28 years old. ? You smoke. ? You get migraines. ? You have ever had a blood clot. What other changes can be made?  Manage your cholesterol levels. ? Eating a healthy diet is important for preventing high cholesterol. If cholesterol cannot be managed through diet alone, you may also need to take medicines. ? Take any prescribed medicines to control your cholesterol as told by your health care provider.  Manage your diabetes. ? Eating a healthy diet and exercising regularly are important parts of managing your blood sugar. If your blood sugar cannot  be managed through diet and exercise, you may need to take medicines. ? Take any prescribed medicines to control your diabetes as told by your health care provider.  Control your hypertension. ? To reduce your risk of stroke, try to keep your blood pressure below 130/80. ? Eating a healthy diet and exercising regularly are an important part of controlling your blood pressure. If your blood pressure cannot be managed through diet and exercise, you may need to take medicines. ? Take any prescribed medicines to control hypertension as told by your health care provider. ? Ask your health care provider if you should monitor your blood pressure at home. ? Have your blood pressure checked every year, even if your blood pressure is normal. Blood pressure increases with age and some medical conditions.  Get evaluated for sleep disorders (sleep apnea). Talk to your health care provider about getting a sleep evaluation if you snore a lot or have excessive sleepiness.  Take over-the-counter and prescription medicines only as told by your health care provider. Aspirin or blood thinners (antiplatelets or anticoagulants) may be recommended to reduce your risk of forming blood clots that can lead to stroke.  Make sure that any other medical conditions you have, such as atrial fibrillation or atherosclerosis, are managed. What are the warning signs of a stroke? The warning signs of a stroke can be easily remembered as BEFAST.  B is for balance. Signs include: ? Dizziness. ? Loss of balance or coordination. ? Sudden trouble walking.  E is for eyes. Signs include: ? A sudden change in vision. ? Trouble seeing.  F is for face. Signs include: ? Sudden weakness or numbness of the face. ? The face or eyelid drooping to one side.  A is for arms. Signs include: ? Sudden weakness or numbness of the arm, usually on one side of the body.  S is for speech. Signs include: ? Trouble speaking  (aphasia). ? Trouble understanding.  T is for time. ? These symptoms may represent a serious problem that is an emergency. Do not wait to see if the symptoms will go away. Get medical help right away. Call your local emergency services (911 in the [place] Do not drive yourself to the  hospital.  Other signs of stroke may include: ? A sudden, severe headache with no known cause. ? Nausea or vomiting. ? Seizure.  Where to find more information: For more information, visit:  American Stroke Association: www.strokeassociation.org  National Stroke Association: www.stroke.org  Summary  You can prevent a stroke by eating healthy, exercising, not smoking, limiting alcohol intake, and managing any medical conditions you may have.  Do not use any products that contain nicotine or tobacco, such as cigarettes and e-cigarettes. If you need help quitting, ask your health care provider. It may also be helpful to avoid exposure to secondhand smoke.  Remember BEFAST for warning signs of stroke. Get help right away if you or a loved one has any of these signs. This information is not intended to replace advice given to you by your health care provider. Make sure you discuss any questions you have with your health care provider. Document Released: 05/11/2004 Document Revised: 05/09/2016 Document Reviewed: 05/09/2016 Elsevier Interactive Patient Education  2018 [employer].  Preventing Atrial Fibrillation-Related Stroke Atrial fibrillation is a common type of irregular or rapid heartbeat (arrhythmia) that greatly increases your risk for a stroke. In atrial fibrillation, the top portions of the heart (atria) beat out of sync with the lower portions of the heart. When the muscles of the atria are tightening in an uncoordinated way (fibrillating), blood can pool in the heart and form clots. If a clot travels to the brain, it can cause a stroke. This type of stroke is preventable. Understanding atrial  fibrillation and knowing how to properly manage it can prevent you from having a stroke. What increases my risk for a stroke? If you have atrial fibrillation, you may be at increased risk for a stroke if you also:  Have heart failure.  Have high blood pressure.  Are older than age 56.  Have diabetes.  Have a history of vascular disease, such as heart attack or stroke.  Are female.  If you have atrial fibrillation and you also have one or more of those risk factors, talk with your health care provider about treatments that can prevent a stroke. Other risk factors for a stroke include:  Smoking.  High cholesterol.  Diabetes.  Being inactive (sedentary lifestyle).  Having a family history of stroke.  Eating a diet that is high in fat, cholesterol, and salt.  What treatments help to manage atrial fibrillation? The main goals of treatment for atrial fibrillation are to prevent blood clots from forming and to keep your heart beating at a normal rate and rhythm. Treatment may include:  Blood-thinning medicine (anticoagulant) that helps to prevent clots from forming. This medicine also increases the risk of bleeding. Talk with your health care provider about the risks and benefits of taking anticoagulants.  Medicine that slows the heart rate or brings the heart rhythm back to normal.  Electrical cardioversion. This is a procedure that resets the heart's rhythm by delivering a controlled, low-energy shock through your skin to your heart.  An ablation procedure, such as catheter ablation, catheter ablation with pacemaker, or surgical ablation. These procedures destroy the heart tissues that send abnormal signals so that heart rhythms can be improved or made normal. A pacemaker is a device that is placed under the skin to help the heart beat in a regular rhythm.  How can I prevent atrial fibrillation-related stroke? Medicines  Take over-the-counter and prescription medicines only as  told by your health care provider.  If your health care provider prescribed  an anticoagulant, take it exactly as told. Taking too much blood-thinning medicine can cause bleeding. If you do not take enough blood-thinning medicine, you will not have the protection that you need against stroke and other problems. Eating and drinking  Eat healthy foods, including at least 5 servings of fruits and vegetables a day.  Do not drink alcohol.  Do not drink beverages that contain caffeine, such as coffee, soda, and tea.  Follow dietary instructions as told by your health care provider. Managing other medical conditions  Manage and be aware of your blood pressure. If you have high blood pressure, follow your treatment plan to keep it in your target range.  Have your cholesterol checked as often as recommended by your health care provider. If you have high cholesterol, follow your treatment plan to lower it and keep it in your target range.  Talk with your health care provider about symptoms to watch for. Some people may not have any symptoms, so it can be hard to know that they have atrial fibrillation. Talk with your health care provider if you experience: ? A feeling that your heart is beating rapidly or irregularly. ? An irregular pulse. ? A feeling of discomfort or pain in your chest. ? Shortness of breath. ? Sudden light-headedness or weakness. ? Tiredness (fatigue) that happens easily during exercise.  If you have obstructive sleep apnea (OSA), manage your condition as told by your health care provider. General instructions  Maintain a healthy weight. Do not use diet pills unless your health care provider approves. Diet pills may make heart problems worse.  Exercise regularly. Get at least 30 minutes of activity on most or all days, or as told by your health care provider.  Do not use any products that contain nicotine or tobacco, such as cigarettes and e-cigarettes. If you need help  quitting, ask your health care provider.  Do not use drugs, such as cocaine and amphetamines.  Keep all follow-up visits as told by your health care providers. This is important. These include visits with your heart specialist. Where to find more information: You may find more information about preventing atrial fibrillation-related stroke from:  National Stroke Association (AFib-Stroke Connection): www.stroke.org  Contact a health care provider if:  You notice a change in the rate, rhythm, or strength of your heartbeat.  You have dizziness.  You are taking an anticoagulant and you have more bruises than usual.  You tire out more easily when you exercise or do similar activities. Get help right away if:  You have chest pain.  You have pain in your abdomen.  You experience unusual sweating or weakness.  You take anticoagulants and you: ? Have severe headaches or confusion. ? Have blood in your vomit, bowel movement, or urine. ? Have bleeding that will not stop. ? Fall or injure your head.  You have any symptoms of a stroke. "BE FAST" is an easy way to remember the main warning signs of a stroke: ? B - Balance. Signs are dizziness, trouble walking, or loss of balance. ? E - Eyes. Signs are trouble seeing or a sudden change in vision. ? F - Face. Signs are sudden weakness or numbness of the face, or the face or eyelid drooping on one side. ? A - Arms. Signs are weakness or numbness in an arm. This happens suddenly and usually on one side of the body. ? S - Speech. Signs are sudden trouble speaking, slurred speech, or trouble understanding what people say. ?  T - Time. Time to call emergency services. Write down what time symptoms started.  You have other signs of a stroke, such as: ? A sudden, severe headache with no known cause. ? Nausea or vomiting. ? Seizure. These symptoms may represent a serious problem that is an emergency. Do not wait to see if the symptoms will go  away. Get medical help right away. Call your local emergency services (911 in the [place] Do not drive yourself to the hospital. Summary  Having atrial fibrillation increases the risk for a stroke. Talk with your health care provider about what symptoms to watch for.  Atrial fibrillation-related stroke is preventable. Proper management of atrial fibrillation can prevent you from having a stroke.  Talk with your health care provider about whether anticoagulant medicine is right for you.  Learn the warning signs of a stroke and remember "BE FAST." This information is not intended to replace advice given to you by your health care provider. Make sure you discuss any questions you have with your health care provider. Document Released: 07/19/2016 Document Revised: 07/19/2016 Document Reviewed: 07/19/2016 Elsevier Interactive Patient Education  2018 [employer].

## 2017-09-17 ENCOUNTER — Telehealth: Payer: Self-pay

## 2017-09-17 NOTE — Telephone Encounter (Signed)
Pt.notified

## 2017-09-17 NOTE — Telephone Encounter (Signed)
Copied from [place] [phone]. Topic: Quick Communication - Rx Refill/Question >> Sep 14, 2017  4:50 PM Oliver Pila B wrote: Pt called and asked for the nurse of pcp to call or send a mychart message on what herb that she mentioned yesterday for [facility] health, call pt to advise

## 2017-09-17 NOTE — Telephone Encounter (Signed)
It's called horse chestnut

## 2017-09-27 ENCOUNTER — Ambulatory Visit: Payer: Medicare Other

## 2017-10-09 ENCOUNTER — Ambulatory Visit: Admit: 2017-10-09 | Payer: Medicare Other | Admitting: Ophthalmology

## 2017-10-09 SURGERY — PHACOEMULSIFICATION, CATARACT, WITH IOL INSERTION
Anesthesia: Choice | Laterality: Right

## 2017-10-12 ENCOUNTER — Telehealth: Payer: Self-pay | Admitting: Family Medicine

## 2017-10-12 NOTE — Telephone Encounter (Signed)
Copied from [place] [phone]. Topic: Inquiry >> Oct 12, 2017  4:00 PM Mylinda Latina, NT wrote: Reason for CRM: Patient called and states she has an appt with the pain management Dr next week 10/16/17 . Patient is wanting an MRI. Patient is wanting to know if she should cancel the appt with Pain management or get an MRI instead. She is still having discomfort in her side and stomach area. Patient is requesting a call back. Patient states you can send a message back to her via Mychart. Please advise. Thank you

## 2017-10-15 NOTE — Telephone Encounter (Signed)
Please advise 

## 2017-10-15 NOTE — Telephone Encounter (Signed)
I will advise her to keep the appointment with pain management tomorrow (July 2nd); they will assess her and help decide if an MRI is indicated

## 2017-10-16 ENCOUNTER — Ambulatory Visit: Payer: Medicare Other | Admitting: Nurse Practitioner

## 2017-10-16 ENCOUNTER — Other Ambulatory Visit: Payer: Self-pay | Admitting: Family Medicine

## 2017-10-16 DIAGNOSIS — M85852 Other specified disorders of bone density and structure, left thigh: Secondary | ICD-10-CM

## 2017-10-16 NOTE — Telephone Encounter (Signed)
I tried to contact this patient to inform her of Dr. Rip Harbour Lada's recommendations but their was no answer. A message was left for her with that information on it and she was asked to give our office a call back if she had any additional questions.

## 2017-10-16 NOTE — Telephone Encounter (Signed)
Copied from [place] [phone]. Topic: Inquiry >> Oct 16, 2017  3:29 PM Pricilla Handler wrote: Reason for CRM: Patient called requesting a refill of Alendronate (FOSAMAX) 70 MG tablet. Patient did contact the pharmacy. Patient's preferred pharmacy is [id number] - [address], [place] - [place] [phone] (Phone)  [phone] (Fax).       Thank You!!!

## 2017-10-17 ENCOUNTER — Other Ambulatory Visit: Payer: Self-pay | Admitting: Family Medicine

## 2017-10-17 DIAGNOSIS — M85852 Other specified disorders of bone density and structure, left thigh: Secondary | ICD-10-CM

## 2017-10-17 MED ORDER — ALENDRONATE SODIUM 70 MG PO TABS: 70.0000 mg | ORAL_TABLET | ORAL | 0 refills | Status: DC

## 2017-10-17 NOTE — Telephone Encounter (Signed)
I cannot figure out how to see the results of the last DEXA scan Can you please show me so I can review the last T-score and see how much longer to continue treatment; I'll refill one month for now and see what her last reading showed

## 2017-10-31 ENCOUNTER — Ambulatory Visit: Payer: Medicare Other | Attending: Nurse Practitioner | Admitting: Nurse Practitioner

## 2017-10-31 ENCOUNTER — Ambulatory Visit
Admission: RE | Admit: 2017-10-31 | Discharge: 2017-10-31 | Disposition: A | Discharge status: Home / Self Care | Payer: Medicare Other | Source: Ambulatory Visit | Attending: Nurse Practitioner | Admitting: Nurse Practitioner

## 2017-10-31 ENCOUNTER — Other Ambulatory Visit: Payer: Self-pay

## 2017-10-31 ENCOUNTER — Encounter: Payer: Self-pay | Admitting: Nurse Practitioner

## 2017-10-31 VITALS — BP 136/116 | HR 101 | Temp 98.0°F | Resp 18 | Ht 69.0 in | Wt 215.0 lb

## 2017-10-31 DIAGNOSIS — G8929 Other chronic pain: Secondary | ICD-10-CM | POA: Insufficient documentation

## 2017-10-31 DIAGNOSIS — I10 Essential (primary) hypertension: Secondary | ICD-10-CM | POA: Insufficient documentation

## 2017-10-31 DIAGNOSIS — M5134 Other intervertebral disc degeneration, thoracic region: Secondary | ICD-10-CM | POA: Insufficient documentation

## 2017-10-31 DIAGNOSIS — M419 Scoliosis, unspecified: Secondary | ICD-10-CM | POA: Diagnosis not present

## 2017-10-31 DIAGNOSIS — Z6830 Body mass index (BMI) 30.0-30.9, adult: Secondary | ICD-10-CM | POA: Diagnosis not present

## 2017-10-31 DIAGNOSIS — E669 Obesity, unspecified: Secondary | ICD-10-CM | POA: Diagnosis not present

## 2017-10-31 DIAGNOSIS — M899 Disorder of bone, unspecified: Secondary | ICD-10-CM | POA: Insufficient documentation

## 2017-10-31 DIAGNOSIS — M545 Low back pain, unspecified: Secondary | ICD-10-CM

## 2017-10-31 DIAGNOSIS — I480 Paroxysmal atrial fibrillation: Secondary | ICD-10-CM | POA: Diagnosis not present

## 2017-10-31 DIAGNOSIS — M549 Dorsalgia, unspecified: Secondary | ICD-10-CM

## 2017-10-31 DIAGNOSIS — R1032 Left lower quadrant pain: Secondary | ICD-10-CM

## 2017-10-31 DIAGNOSIS — G894 Chronic pain syndrome: Secondary | ICD-10-CM | POA: Insufficient documentation

## 2017-10-31 DIAGNOSIS — R109 Unspecified abdominal pain: Secondary | ICD-10-CM | POA: Insufficient documentation

## 2017-10-31 DIAGNOSIS — Z9889 Other specified postprocedural states: Secondary | ICD-10-CM | POA: Diagnosis not present

## 2017-10-31 DIAGNOSIS — Z79899 Other long term (current) drug therapy: Secondary | ICD-10-CM | POA: Insufficient documentation

## 2017-10-31 DIAGNOSIS — Z90710 Acquired absence of both cervix and uterus: Secondary | ICD-10-CM | POA: Insufficient documentation

## 2017-10-31 DIAGNOSIS — M25572 Pain in left ankle and joints of left foot: Secondary | ICD-10-CM

## 2017-10-31 DIAGNOSIS — Z789 Other specified health status: Secondary | ICD-10-CM | POA: Insufficient documentation

## 2017-10-31 HISTORY — DX: Low back pain, unspecified: M54.50

## 2017-10-31 HISTORY — DX: Other chronic pain: G89.29

## 2017-10-31 HISTORY — DX: Dorsalgia, unspecified: M54.9

## 2017-10-31 NOTE — Progress Notes (Signed)
Safety precautions to be maintained throughout the outpatient stay will include: orient to surroundings, keep bed in low position, maintain call bell within reach at all times, provide assistance with transfer out of bed and ambulation.  

## 2017-10-31 NOTE — Patient Instructions (Signed)

## 2017-10-31 NOTE — Progress Notes (Addendum)
Patient's Name: Rebecca Anthony  MRN: [id number]  Referring Provider: Arnetha Courser, MD  DOB: 04-30-1945  PCP: Arnetha Courser, MD  DOS: 10/31/2017  Note by: Dionisio David NP  Service setting: Ambulatory outpatient  Specialty: Interventional Pain Management  Location: [facility] (AMB) Pain Management Facility    Patient type: New Patient    Primary Reason(s) for Visit: Initial Patient Evaluation CC: Abdominal Pain (bilateral and worse on the left) and Back Pain (low)  HPI  Ms. Mimbs is a 72 y.o. year old, female patient, who comes today for an initial evaluation. She has PAF (paroxysmal atrial fibrillation) ([place]); Hypertension; Heart murmur; Abnormal EKG; Annual physical exam; Abdominal pain in female; Osteopenia of left femoral neck; Onychomycosis; Irregular heart rhythm; LLQ abdominal pain; Hypothyroidism; Low back pain; Hematuria, microscopic; Obesity (BMI 30.0-34.9); Abdominal pain, chronic, left lower quadrant (Primary Area of Pain); Chronic bilateral low back pain without sciatica (Secondary Area of Pain) (Bilateral) (L>R); Chronic pain syndrome; Pharmacologic therapy; Disorder of skeletal system; Problems influencing health status; Chronic upper back pain (Tertiary Area of Pain) (L>R); and Chronic pain of left ankle (Fourth Area of Pain) on their problem list.. Her primarily concern today is the Abdominal Pain (bilateral and worse on the left) and Back Pain (low)  Pain Assessment: Location: Left Abdomen Radiating: right abdomen and low back Onset: More than a month ago Duration: Chronic pain Quality: Throbbing, Constant, Pressure, Aching Severity: 7 /10 (subjective, self-reported pain score)  Note: Reported level is compatible with observation. Clinically the patient looks like a 3/10 A 3/10 is viewed as "Moderate" and described as significantly interfering with activities of daily living (ADL). It becomes difficult to feed, bathe, get dressed, get on and off the toilet or to perform personal  hygiene functions. Difficult to get in and out of bed or a chair without assistance. Very distracting. With effort, it can be ignored when deeply involved in activities. Information on the proper use of the pain scale provided to the patient today. When using our objective Pain Scale, levels between 6 and 10/10 are said to belong in an emergency room, as it progressively worsens from a 6/10, described as severely limiting, requiring emergency care not usually available at an outpatient pain management facility. At a 6/10 level, communication becomes difficult and requires great effort. Assistance to reach the emergency department may be required. Facial flushing and profuse sweating along with potentially dangerous increases in heart rate and blood pressure will be evident. Timing: Constant Modifying factors: lying flat on her back, hot showers, NSAIDS, Zanaflex BP: (!) 136/116(left 128/99)  HR: (!) 101  Onset and Duration: Gradual Cause of pain: Unknown Severity: Getting worse Timing: Not influenced by the time of the day and During activity or exercise Aggravating Factors: Prolonged standing Alleviating Factors: Stretching, Lying down, Resting and Warm showers or baths Associated Problems: Fatigue and Pain that wakes patient up Quality of Pain: Aching, Annoying, Dull, Nagging, Throbbing and Uncomfortable Previous Examinations or Tests: CT scan Previous Treatments: The patient denies any treatments.  The patient comes into the clinics today for the first time for a chronic pain management evaluation.  According to the patient her primary area of pain is in her abdomen.  She admits that is on the left.  She admits that it does radiate around to her back and sometimes it goes on to the right side.  She admits that he start with the tenderness and he has gotten sharp.  She does have problems with constipation and  she uses MiraLAX as needed.  She currently is on the DASH diet using prune juice and more  fruits and vegetables which seems to be effective.  He has had a previous CT of her abdomen which showed diverticulosis.  She is also had a renal ultrasound which was negative.  Her second area of pain is in her lower back.  She admits that the left side is greater than the right.  She admits that 20 years ago she had an episode which she had back pain and she rested for several days and to get better.  She denied any previous injury.  She denies any previous surgery, interventional therapy.  She does use home exercises.  In this CT scan of abdomen her lower back impression was completed.  Third area of pain is in her left ankle.  She admits that she has had previous torn tendon.    She admits that her balance is off.  She admits that this pain is just occasional. She did undergo surgery in 2015 with [facility], bunionectomy and hammertoe.  Today I took the time to provide the patient with information regarding this pain practice. The patient was informed that the practice is divided into two sections: an interventional pain management section, as well as a completely separate and distinct medication management section. I explained that there are procedure days for interventional therapies, and evaluation days for follow-ups and medication management. Because of the amount of documentation required during both, they are kept separated. This means that there is the possibility that she may be scheduled for a procedure on one day, and medication management the next. I have also informed her that because of staffing and facility limitations, this practice will no longer take patients for medication management only. To illustrate the reasons for this, I gave the patient the example of surgeons, and how inappropriate it would be to refer a patient to his/her care, just to write for the post-surgical antibiotics on a surgery done by a different surgeon.   Because interventional pain management is part of the  board-certified specialty for the doctors, the patient was informed that joining this practice means that they are open to any and all interventional therapies. I made it clear that this does not mean that they will be forced to have any procedures done. What this means is that I believe interventional therapies to be essential part of the diagnosis and proper management of chronic pain conditions. Therefore, patients not interested in these interventional alternatives will be better served under the care of a different practitioner.  The patient was also made aware of my Comprehensive Pain Management Safety Guidelines where by joining this practice, they limit all of their nerve blocks and joint injections to those done by our practice, for as long as we are retained to manage their care. Historic Controlled Substance Pharmacotherapy Review  PMP and historical list of controlled substances: Hydrocodone/Chlorphen ER suspension, Guiatussin liquid, diazepam 5 mg, hydrocodone/acetaminophen 5/325 mg, hydrocodone/acetaminophen 10/650 mg, Highest opioid analgesic regimen found: Oxycodone/acetaminophen 10/650 mg 1 tablet 5 times daily (fill date 12/08/2011) oxycodone 50 mg Most recent opioid analgesic: None Current opioid analgesics: None Highest recorded MME/day: 83 mg/day MME/day: 0 mg/day Medications: The patient did not bring the medication(s) to the appointment, as requested in our "New Patient Package" Pharmacodynamics: Desired effects: Analgesia: The patient reports >50% benefit. Reported improvement in function: The patient reports medication allows her to accomplish basic ADLs. Clinically meaningful improvement in function (CMIF): Sustained  CMIF goals met Perceived effectiveness: Described as relatively effective, allowing for increase in activities of daily living (ADL) Undesirable effects: Side-effects or Adverse reactions: None reported Historical Monitoring: The patient  reports that she  does not use drugs. List of all UDS Test(s): No results found for: MDMA, COCAINSCRNUR, PCPSCRNUR, PCPQUANT, CANNABQUANT, THCU, [place] List of all Serum Drug Screening Test(s):  No results found for: AMPHSCRSER, BARBSCRSER, BENZOSCRSER, COCAINSCRSER, PCPSCRSER, PCPQUANT, THCSCRSER, CANNABQUANT, OPIATESCRSER, OXYSCRSER, PROPOXSCRSER Historical Background Evaluation: [place] PDMP: Six (6) year initial data search conducted.             [place] Department of public safety, offender search: Editor, commissioning Information) Non-contributory Risk Assessment Profile: Aberrant behavior: None observed or detected today Risk factors for fatal opioid overdose: None identified today Fatal overdose hazard ratio (HR): Calculation deferred Non-fatal overdose hazard ratio (HR): Calculation deferred Risk of opioid abuse or dependence: 0.7-3.0% with doses ? 36 MME/day and 6.1-26% with doses ? 120 MME/day. Substance use disorder (SUD) risk level: Pending results of Medical Psychology Evaluation for SUD Opioid risk tool (ORT) (Total Score): 0  ORT Scoring interpretation table:  Score <3 = Low Risk for SUD  Score between 4-7 = Moderate Risk for SUD  Score >8 = High Risk for Opioid Abuse   PHQ-2 Depression Scale:  Total score: 0  PHQ-2 Scoring interpretation table: (Score and probability of major depressive disorder)  Score 0 = No depression  Score 1 = 15.4% Probability  Score 2 = 21.1% Probability  Score 3 = 38.4% Probability  Score 4 = 45.5% Probability  Score 5 = 56.4% Probability  Score 6 = 78.6% Probability   PHQ-9 Depression Scale:  Total score: 0  PHQ-9 Scoring interpretation table:  Score 0-4 = No depression  Score 5-9 = Mild depression  Score 10-14 = Moderate depression  Score 15-19 = Moderately severe depression  Score 20-27 = Severe depression (2.4 times higher risk of SUD and 2.89 times higher risk of overuse)   Pharmacologic Plan: Pending ordered tests and/or consults  Meds  The patient has a current  medication list which includes the following prescription(s): alendronate, aspirin, calcium carbonate, horse chestnut, levothyroxine, metoprolol succinate, polyethylene glycol powder, and vitamin c.  Current Outpatient Medications on File Prior to Visit  Medication Sig  . alendronate (FOSAMAX) 70 MG tablet TAKE 1 TABLET BY MOUTH ONCE A WEEK. TAKE WITH A FULL GLASS OF WATER ON AN EMPTY STOMACH  . aspirin 81 MG tablet Take 1 tablet (81 mg total) by mouth daily. Do NOT take with Xarelto (just one or the other)  . calcium carbonate (OSCAL) 1500 (600 Ca) MG TABS tablet Take 1,500 mg by mouth daily with breakfast.   . Horse Chestnut 300 MG CAPS Take 1 capsule by mouth daily.  Marland Kitchen levothyroxine (SYNTHROID, LEVOTHROID) 25 MCG tablet Take 1 tablet by mouth daily.  . metoprolol succinate (TOPROL-XL) 50 MG 24 hr tablet TAKE 1 TABLET BY MOUTH EVERY DAY  . polyethylene glycol powder (GLYCOLAX/MIRALAX) powder Take 17 g by mouth daily. Dissolve 1 capful in 8 oz of liquid daily PRN  . vitamin C (ASCORBIC ACID) 500 MG tablet Take 500 mg by mouth daily.   No current facility-administered medications on file prior to visit.    Imaging Review  Note: No results found under the [place] record.        ROS  Cardiovascular History: Abnormal heart rhythm, Daily Aspirin intake, High blood pressure and Heart murmur Pulmonary or Respiratory History: Snoring  Neurological  History: No reported neurological signs or symptoms such as seizures, abnormal skin sensations, urinary and/or fecal incontinence, being born with an abnormal open spine and/or a tethered spinal cord Review of Past Neurological Studies: No results found for this or any previous visit. Psychological-Psychiatric History: No reported psychological or psychiatric signs or symptoms such as difficulty sleeping, anxiety, depression, delusions or hallucinations (schizophrenial), mood swings (bipolar disorders) or suicidal ideations or  attempts Gastrointestinal History: No reported gastrointestinal signs or symptoms such as vomiting or evacuating blood, reflux, heartburn, alternating episodes of diarrhea and constipation, inflamed or scarred liver, or pancreas or irrregular and/or infrequent bowel movements Genitourinary History: Peeing blood Hematological History: No reported hematological signs or symptoms such as prolonged bleeding, low or poor functioning platelets, bruising or bleeding easily, hereditary bleeding problems, low energy levels due to low hemoglobin or being anemic Endocrine History: Slow thyroid Rheumatologic History: No reported rheumatological signs and symptoms such as fatigue, joint pain, tenderness, swelling, redness, heat, stiffness, decreased range of motion, with or without associated rash Musculoskeletal History: Negative for myasthenia gravis, muscular dystrophy, multiple sclerosis or malignant hyperthermia Work History: Retired  Allergies  Ms. Cleaves is allergic to sulfa antibiotics and anaprox [naproxen sodium].  Laboratory Chemistry  Inflammation Markers Lab Results  Component Value Date   CRP 3 10/31/2017   ESRSEDRATE 15 10/31/2017   (CRP: Acute Phase) (ESR: Chronic Phase) Renal Function Markers Lab Results  Component Value Date   BUN 15 10/31/2017   CREATININE 0.94 10/31/2017   GFRAA 70 10/31/2017   GFRNONAA 60 10/31/2017   Hepatic Function Markers Lab Results  Component Value Date   AST 26 10/31/2017   ALT 20 05/17/2017   ALBUMIN 4.6 10/31/2017   ALKPHOS 78 10/31/2017   Electrolytes Lab Results  Component Value Date   NA 142 10/31/2017   K 4.3 10/31/2017   CL 103 10/31/2017   CALCIUM 10.5 (H) 10/31/2017   MG 2.2 10/31/2017   Neuropathy Markers Lab Results  Component Value Date   VITAMINB12 670 10/31/2017   Bone Pathology Markers Lab Results  Component Value Date   ALKPHOS 78 10/31/2017   VD25OH 37.4 05/17/2017   VD25OH 33.0 05/17/2017   CALCIUM 10.5 (H)  10/31/2017   Coagulation Parameters Lab Results  Component Value Date   PLT 294 05/17/2017   Cardiovascular Markers Lab Results  Component Value Date   HGB 13.5 05/17/2017   HCT 41.6 05/17/2017   Note: Lab results reviewed.  [place]  Drug: Ms. Biggs  reports that she does not use drugs. Alcohol:  reports that she does not drink alcohol. Tobacco:  reports that she has never smoked. She has never used smokeless tobacco. Medical:  has a past medical history of Dysuria, Heart murmur, Hematuria, unspecified, History of shingles, History of swelling of feet, Hypertension, Irritable bowel syndrome, Lichenification and lichen simplex chronicus, Localized superficial swelling, mass, or lump, Osteoarthrosis, unspecified whether generalized or localized, unspecified site, Osteoporosis, unspecified, PAF (paroxysmal atrial fibrillation) ([place]), Sinus problem, and Synovial cyst, unspecified. Family: family history includes Diabetes in her mother; Heart disease in her father; Heart failure in her mother; High blood pressure in her father; Hypertension in her father and mother; Thyroid disease in her sister.  Past Surgical History:  Procedure Laterality Date  . ANKLE SURGERY Left   . APPENDECTOMY    . AV FISTULA REPAIR    . FOOT SURGERY Left   . VAGINAL HYSTERECTOMY    . VESICOVAGINAL FISTULA CLOSURE W/ TAH     Active Ambulatory  Problems    Diagnosis Date Noted  . PAF (paroxysmal atrial fibrillation) ([place])   . Hypertension   . Heart murmur   . Abnormal EKG 08/29/2013  . Annual physical exam 11/25/2015  . Abdominal pain in female 01/20/2016  . Osteopenia of left femoral neck 03/23/2016  . Onychomycosis 03/23/2016  . Irregular heart rhythm 03/23/2016  . LLQ abdominal pain 09/08/2016  . Hypothyroidism 02/23/2017  . Low back pain 03/01/2017  . Hematuria, microscopic 07/16/2017  . Obesity (BMI 30.0-34.9) 09/13/2017  . Abdominal pain, chronic, left lower quadrant (Primary Area of Pain)  10/31/2017  . Chronic bilateral low back pain without sciatica (Secondary Area of Pain) (Bilateral) (L>R) 10/31/2017  . Chronic pain syndrome 10/31/2017  . Pharmacologic therapy 10/31/2017  . Disorder of skeletal system 10/31/2017  . Problems influencing health status 10/31/2017  . Chronic upper back pain [facility] Area of Pain) (L>R) 10/31/2017  . Chronic pain of left ankle (Fourth Area of Pain) 11/07/2017   Resolved Ambulatory Problems    Diagnosis Date Noted  . No Resolved Ambulatory Problems   Past Medical History:  Diagnosis Date  . Dysuria   . Heart murmur   . Hematuria, unspecified   . History of shingles   . History of swelling of feet   . Hypertension   . Irritable bowel syndrome   . Lichenification and lichen simplex chronicus   . Localized superficial swelling, mass, or lump   . Osteoarthrosis, unspecified whether generalized or localized, unspecified site   . Osteoporosis, unspecified   . PAF (paroxysmal atrial fibrillation) ([place])   . Sinus problem   . Synovial cyst, unspecified    Constitutional Exam  General appearance: Well nourished, well developed, and well hydrated. In no apparent acute distress Vitals:   10/31/17 1416  BP: (!) 136/116  Pulse: (!) 101  Resp: 18  Temp: 98 F (36.7 C)  TempSrc: Oral  SpO2: 99%  Weight: 215 lb (97.5 kg)  Height: 5' 9" (1.753 m)   BMI Assessment: Estimated body mass index is 31.75 kg/m as calculated from the following:   Height as of this encounter: 5' 9" (1.753 m).   Weight as of this encounter: 215 lb (97.5 kg).  BMI interpretation table: BMI level Category Range association with higher incidence of chronic pain  <18 kg/m2 Underweight   18.5-24.9 kg/m2 Ideal body weight   25-29.9 kg/m2 Overweight Increased incidence by 20%  30-34.9 kg/m2 Obese (Class I) Increased incidence by 68%  35-39.9 kg/m2 Severe obesity (Class II) Increased incidence by 136%  >40 kg/m2 Extreme obesity (Class III) Increased incidence by  254%   BMI Readings from Last 4 Encounters:  10/31/17 31.75 kg/m  09/13/17 32.30 kg/m  08/23/17 32.07 kg/m  07/16/17 31.75 kg/m   Wt Readings from Last 4 Encounters:  10/31/17 215 lb (97.5 kg)  09/13/17 218 lb 11.2 oz (99.2 kg)  08/23/17 217 lb 3.2 oz (98.5 kg)  07/16/17 215 lb (97.5 kg)  Psych/Mental status: Alert, oriented x 3 (person, place, & time)       Eyes: PERLA Respiratory: No evidence of acute respiratory distress  Cervical Spine Exam  Inspection: No masses, redness, or swelling Alignment: Symmetrical Functional ROM: Unrestricted ROM      Stability: No instability detected Muscle strength & Tone: Functionally intact Sensory: Unimpaired Palpation: No palpable anomalies              Upper Extremity (UE) Exam    Side: Right upper extremity  Side: Left upper extremity  Inspection: No masses, redness, swelling, or asymmetry. No contractures  Inspection: No masses, redness, swelling, or asymmetry. No contractures  Functional ROM: Unrestricted ROM          Functional ROM: Unrestricted ROM          Muscle strength & Tone: Functionally intact  Muscle strength & Tone: Functionally intact  Sensory: Unimpaired  Sensory: Unimpaired  Palpation: No palpable anomalies              Palpation: No palpable anomalies              Specialized Test(s): Deferred         Specialized Test(s): Deferred          Thoracic Spine Exam  Inspection: No masses, redness, or swelling Alignment: Asymmetric Functional ROM: Adequate ROM Stability: No instability detected Sensory: Unimpaired Muscle strength & Tone: Complains of area being tender to palpation  Lumbar Spine Exam  Inspection: No masses, redness, or swelling Alignment: Scoliosis detected Functional ROM: Adequate ROM      Stability: No instability detected Muscle strength & Tone: Functionally intact Sensory: Unimpaired Palpation: Complains of area being tender to palpation       Provocative Tests: Lumbar Hyperextension and  rotation test: Positive bilaterally for facet joint pain. Patrick's Maneuver: Negative                    Gait & Posture Assessment  Ambulation: Unassisted Gait: Relatively normal for age and body habitus slight altered her gait Posture: WNL   Lower Extremity Exam    Side: Right lower extremity  Side: Left lower extremity  Inspection: No masses, redness, swelling, or asymmetry. No contractures  Inspection: Mild swelling  Functional ROM: Unrestricted ROM          Functional ROM: Decreased ROM        Ankle  Muscle strength & Tone: Functionally intact  Muscle strength & Tone: Functionally intact  Sensory: Unimpaired  Sensory: Unimpaired  Palpation: No palpable anomalies  Palpation: No palpable anomalies   Assessment  Primary Diagnosis & Pertinent Problem List: The primary encounter diagnosis was Abdominal pain, chronic, left lower quadrant. Diagnoses of Chronic bilateral low back pain without sciatica, Chronic pain of left ankle (Tertiary Area of Pain), Chronic upper back pain, Pharmacologic therapy, Chronic pain syndrome, Disorder of skeletal system, and Problems influencing health status were also pertinent to this visit.  Visit Diagnosis: 1. Abdominal pain, chronic, left lower quadrant   2. Chronic bilateral low back pain without sciatica   3. Chronic pain of left ankle [facility] Area of Pain)   4. Chronic upper back pain   5. Pharmacologic therapy   6. Chronic pain syndrome   7. Disorder of skeletal system   8. Problems influencing health status    Plan of Care  Initial treatment plan:  Please be advised that as per protocol, today's visit has been an evaluation only. We have not taken over the patient's controlled substance management.  Problem-specific plan: No problem-specific Assessment & Plan notes found for this encounter.  Ordered Lab-work, Procedure(s), Referral(s), & Consult(s): Orders Placed This Encounter  Procedures  . DG Thoracic Spine 2 View  . Compliance Drug  Analysis, Ur  . Comp. Metabolic Panel (12)  . Magnesium  . Vitamin B12  . Sedimentation rate  . C-reactive protein   Pharmacotherapy: Medications ordered:  No orders of the defined types were placed in this encounter.  Medications administered during this visit: Shawana B. Dockery had no  medications administered during this visit.   Pharmacotherapy under consideration:  Opioid Analgesics: The patient was informed that there is no guarantee that she would be a candidate for opioid analgesics. The decision will be made following CDC guidelines. This decision will be based on the results of diagnostic studies, as well as Ms. Scafidi risk profile.  Membrane stabilizer: To be determined at a later time Muscle relaxant: To be determined at a later time NSAID: To be determined at a later time Other analgesic(s): To be determined at a later time   Interventional therapies under consideration: Ms. Peth was informed that there is no guarantee that she would be a candidate for interventional therapies. The decision will be based on the results of diagnostic studies, as well as Ms. Hipps risk profile.  Possible procedure(s): Diagnostic celiac plexus nerve block Diagnostic bilateral lumbar facet nerve block Possible bilateral lumbar facet radiofrequency ablation Diagnostic left-sided thoracic facet nerve block Possible left-sided thoracic facet radiofrequency ablation Diagnostic trigger point injections   Provider-requested follow-up: Return for 2nd Visit, w/ Dr. Dossie Arbour.  Future Appointments  Date Time Provider [place]  11/13/2017  3:00 PM Minna Merritts, MD [facility]  11/19/2017  3:20 PM Arnetha Courser, MD [place] [facility]  11/21/2017  4:00 PM [place] [facility]  11/28/2017  1:30 PM Milinda Pointer, MD [facility] None  02/26/2018  3:30 PM Renato Shin, MD [facility] None    Primary Care Physician: Arnetha Courser, MD Location: [facility] Outpatient  Pain Management Facility Note by:  Date: 10/31/2017; Time: 10:59 AM  Pain Score Disclaimer: We use the NRS-11 scale. This is a self-reported, subjective measurement of pain severity with only modest accuracy. It is used primarily to identify changes within a particular patient. It must be understood that outpatient pain scales are significantly less accurate that those used for research, where they can be applied under ideal controlled circumstances with minimal exposure to variables. In reality, the score is likely to be a combination of pain intensity and pain affect, where pain affect describes the degree of emotional arousal or changes in action readiness caused by the sensory experience of pain. Factors such as social and work situation, setting, emotional state, anxiety levels, expectation, and prior pain experience may influence pain perception and show large inter-individual differences that may also be affected by time variables.  Patient instructions provided during this appointment: Patient Instructions   ____________________________________________________________________________________________  Pain Scale  Introduction: The pain score used by this practice is the Verbal Numerical Rating Scale (VNRS-11). This is an 11-point scale. It is for adults and children 10 years or older. There are significant differences in how the pain score is reported, used, and applied. Forget everything you learned in the past and learn this scoring system.  General Information: The scale should reflect your current level of pain. Unless you are specifically asked for the level of your worst pain, or your average pain. If you are asked for one of these two, then it should be understood that it is over the past 24 hours.  Basic Activities of Daily Living (ADL): Personal hygiene, dressing, eating, transferring, and using restroom.  Instructions: Most patients tend to report their level of pain as a  combination of two factors, their physical pain and their psychosocial pain. This last one is also known as "suffering" and it is reflection of how physical pain affects you socially and psychologically. From now on, report them separately. From this point on, when asked to  report your pain level, report only your physical pain. Use the following table for reference.  Pain Clinic Pain Levels (0-5/10)  Pain Level Score  Description  No Pain 0   Mild pain 1 Nagging, annoying, but does not interfere with basic activities of daily living (ADL). Patients are able to eat, bathe, get dressed, toileting (being able to get on and off the toilet and perform personal hygiene functions), transfer (move in and out of bed or a chair without assistance), and maintain continence (able to control bladder and bowel functions). Blood pressure and heart rate are unaffected. A normal heart rate for a healthy adult ranges from 60 to 100 bpm (beats per minute).   Mild to moderate pain 2 Noticeable and distracting. Impossible to hide from other people. More frequent flare-ups. Still possible to adapt and function close to normal. It can be very annoying and may have occasional stronger flare-ups. With discipline, patients may get used to it and adapt.   Moderate pain 3 Interferes significantly with activities of daily living (ADL). It becomes difficult to feed, bathe, get dressed, get on and off the toilet or to perform personal hygiene functions. Difficult to get in and out of bed or a chair without assistance. Very distracting. With effort, it can be ignored when deeply involved in activities.   Moderately severe pain 4 Impossible to ignore for more than a few minutes. With effort, patients may still be able to manage work or participate in some social activities. Very difficult to concentrate. Signs of autonomic nervous system discharge are evident: dilated pupils (mydriasis); mild sweating (diaphoresis); sleep interference.  Heart rate becomes elevated (>115 bpm). Diastolic blood pressure (lower number) rises above 100 mmHg. Patients find relief in laying down and not moving.   Severe pain 5 Intense and extremely unpleasant. Associated with frowning face and frequent crying. Pain overwhelms the senses.  Ability to do any activity or maintain social relationships becomes significantly limited. Conversation becomes difficult. Pacing back and forth is common, as getting into a comfortable position is nearly impossible. Pain wakes you up from deep sleep. Physical signs will be obvious: pupillary dilation; increased sweating; goosebumps; brisk reflexes; cold, clammy hands and feet; nausea, vomiting or dry heaves; loss of appetite; significant sleep disturbance with inability to fall asleep or to remain asleep. When persistent, significant weight loss is observed due to the complete loss of appetite and sleep deprivation.  Blood pressure and heart rate becomes significantly elevated. Caution: If elevated blood pressure triggers a pounding headache associated with blurred vision, then the patient should immediately seek attention at an urgent or emergency care unit, as these may be signs of an impending stroke.    Emergency Department Pain Levels (6-10/10)  Emergency Room Pain 6 Severely limiting. Requires emergency care and should not be seen or managed at an outpatient pain management facility. Communication becomes difficult and requires great effort. Assistance to reach the emergency department may be required. Facial flushing and profuse sweating along with potentially dangerous increases in heart rate and blood pressure will be evident.   Distressing pain 7 Self-care is very difficult. Assistance is required to transport, or use restroom. Assistance to reach the emergency department will be required. Tasks requiring coordination, such as bathing and getting dressed become very difficult.   Disabling pain 8 Self-care is no  longer possible. At this level, pain is disabling. The individual is unable to do even the most "basic" activities such as walking, eating, bathing, dressing, transferring to a  bed, or toileting. Fine motor skills are lost. It is difficult to think clearly.   Incapacitating pain 9 Pain becomes incapacitating. Thought processing is no longer possible. Difficult to remember your own name. Control of movement and coordination are lost.   The worst pain imaginable 10 At this level, most patients pass out from pain. When this level is reached, collapse of the autonomic nervous system occurs, leading to a sudden drop in blood pressure and heart rate. This in turn results in a temporary and dramatic drop in blood flow to the brain, leading to a loss of consciousness. Fainting is one of the body's self defense mechanisms. Passing out puts the brain in a calmed state and causes it to shut down for a while, in order to begin the healing process.    Summary: 1. Refer to this scale when providing [place] with your pain level. 2. Be accurate and careful when reporting your pain level. This will help with your care. 3. Over-reporting your pain level will lead to loss of credibility. 4. Even a level of 1/10 means that there is pain and will be treated at our facility. 5. High, inaccurate reporting will be documented as "Symptom Exaggeration", leading to loss of credibility and suspicions of possible secondary gains such as obtaining more narcotics, or wanting to appear disabled, for fraudulent reasons. 6. Only pain levels of 5 or below will be seen at our facility. 7. Pain levels of 6 and above will be sent to the Emergency Department and the appointment cancelled. ____________________________________________________________________________________________

## 2017-11-01 ENCOUNTER — Telehealth: Payer: Self-pay | Admitting: *Deleted

## 2017-11-01 LAB — COMP. METABOLIC PANEL (12)
ALBUMIN: 4.6 g/dL (ref 3.5–4.8)
AST: 26 IU/L (ref 0–40)
Albumin/Globulin Ratio: 1.7 (ref 1.2–2.2)
Alkaline Phosphatase: 78 IU/L (ref 39–117)
BILIRUBIN TOTAL: 0.5 mg/dL (ref 0.0–1.2)
BUN / CREAT RATIO: 16 (ref 12–28)
BUN: 15 mg/dL (ref 8–27)
CREATININE: 0.94 mg/dL (ref 0.57–1.00)
Calcium: 10.5 mg/dL — ABNORMAL HIGH (ref 8.7–10.3)
Chloride: 103 mmol/L (ref 96–106)
GFR calc Af Amer: 70 mL/min/{1.73_m2} (ref >59)
GFR calc non Af Amer: 60 mL/min/{1.73_m2} (ref >59)
Globulin, Total: 2.7 g/dL (ref 1.5–4.5)
Glucose: 91 mg/dL (ref 65–99)
Potassium: 4.3 mmol/L (ref 3.5–5.2)
SODIUM: 142 mmol/L (ref 134–144)
TOTAL PROTEIN: 7.3 g/dL (ref 6.0–8.5)

## 2017-11-01 LAB — VITAMIN B12: Vitamin B-12: 670 pg/mL (ref 232–1245)

## 2017-11-01 LAB — MAGNESIUM: MAGNESIUM: 2.2 mg/dL (ref 1.6–2.3)

## 2017-11-01 LAB — C-REACTIVE PROTEIN: CRP: 3 mg/L (ref 0–10)

## 2017-11-01 LAB — SEDIMENTATION RATE: Sed Rate: 15 mm/hr (ref 0–40)

## 2017-11-01 NOTE — Progress Notes (Signed)
Results were reviewed and found to be: mildly abnormal  No acute injury or pathology identified  Review would suggest interventional pain management techniques may be of benefit 

## 2017-11-01 NOTE — Telephone Encounter (Signed)
Called and left a message with the patient at 4:59

## 2017-11-04 LAB — COMPLIANCE DRUG ANALYSIS, UR

## 2017-11-07 DIAGNOSIS — G8929 Other chronic pain: Secondary | ICD-10-CM

## 2017-11-07 DIAGNOSIS — M25572 Pain in left ankle and joints of left foot: Secondary | ICD-10-CM

## 2017-11-07 HISTORY — DX: Pain in left ankle and joints of left foot: M25.572

## 2017-11-07 HISTORY — DX: Other chronic pain: G89.29

## 2017-11-13 ENCOUNTER — Ambulatory Visit: Payer: Medicare Other | Admitting: Cardiovascular Disease

## 2017-11-19 ENCOUNTER — Ambulatory Visit: Payer: Medicare Other | Admitting: Family Medicine

## 2017-11-21 ENCOUNTER — Encounter: Payer: Self-pay | Admitting: Podiatry

## 2017-11-21 ENCOUNTER — Ambulatory Visit: Payer: Medicare Other | Admitting: Podiatry

## 2017-11-21 DIAGNOSIS — Q828 Other specified congenital malformations of skin: Secondary | ICD-10-CM | POA: Diagnosis not present

## 2017-11-21 NOTE — Progress Notes (Signed)
She presents today concerned of painful calluses of the forefoot and painful thick toenails.  Objective: Vital signs are stable she is alert and oriented x3.  Pulses are palpable.  Hammertoe deformity second digit right foot most likely associated with a short orthotic.  She also has a nail dystrophy to this nail plate which I debrided for her today.  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  Plan: Debridement of nails 1 through 5 bilateral today I also debrided all reactive hyperkeratoses I will follow-up with her in the near future.  She will follow-up with Liliane Channel in the near future for reconstruct on her orthotic which needs to be longer.

## 2017-11-26 NOTE — Progress Notes (Signed)
Patient's Name: Rebecca Anthony  MRN: [id number]  Referring Provider: Arnetha Courser, MD  DOB: December 06, 1945  PCP: Arnetha Courser, MD  DOS: 11/28/2017  Note by: Gaspar Cola, MD  Service setting: Ambulatory outpatient  Specialty: Interventional Pain Management  Location: [facility] (AMB) Pain Management Facility    Patient type: Established   Primary Reason(s) for Visit: Encounter for evaluation before starting new chronic pain management plan of care (Level of risk: moderate) CC: Abdominal Pain (LLQ)  HPI  Rebecca Anthony is a 72 y.o. year old, female patient, who comes today for a follow-up evaluation to review the test results and decide on a treatment plan. She has PAF (paroxysmal atrial fibrillation) ([place]); Hypertension; Heart murmur; Abnormal EKG; Annual physical exam; Abdominal pain in female; Osteopenia of left femoral neck; Onychomycosis; Irregular heart rhythm; LLQ abdominal pain; Hypothyroidism; Hematuria, microscopic; Obesity (BMI 30.0-34.9); Chronic abdominal pain (LLQ) (Primary Area of Pain) (Left); Chronic low back pain (Secondary Area of Pain) (Bilateral) (L>R); Chronic pain syndrome; Pharmacologic therapy; Disorder of skeletal system; Problems influencing health status; Chronic upper back pain [facility] Area of Pain) (Bilateral) (L>R); Chronic ankle pain (Fourth Area of Pain) (Left); Idiopathic scoliosis of thoracolumbar spine; and Sigmoid diverticulosis on their problem list. Her primarily concern today is the Abdominal Pain (LLQ)  Pain Assessment: Location: Left, Lower Abdomen Radiating: radiates around back and to left abdomen  Onset: More than a month ago Duration: Chronic pain Quality: Discomfort, Throbbing, Pressure, Aching, Sore, Tender Severity: 2 /10 (subjective, self-reported pain score)  Note: Reported level is compatible with observation.                         When using our objective Pain Scale, levels between 6 and 10/10 are said to belong in an emergency room, as it  progressively worsens from a 6/10, described as severely limiting, requiring emergency care not usually available at an outpatient pain management facility. At a 6/10 level, communication becomes difficult and requires great effort. Assistance to reach the emergency department may be required. Facial flushing and profuse sweating along with potentially dangerous increases in heart rate and blood pressure will be evident. Effect on ADL: most of the time she is able to perform household duties but other times she is just tired.  sometimes feels as if there is knot, gripping in the LLQ.  Timing: Intermittent Modifying factors: lying flat on her back and stretch out straight gives the most relief. zanaflex did help when taken occassionally  BP: (!) 169/74  HR: 66  Rebecca Anthony comes in today for a follow-up visit after her initial evaluation on 11/01/2017. Today we went over the results of her tests. These were explained in "Layman's terms". During today's appointment we went over my diagnostic impression, as well as the proposed treatment plan.  According to the patient her primary area of pain is in her abdomen (LLQ).  She admits that is on the left.  She admits that it does radiate around to her back and sometimes it goes on to the right side.  She admits that he start with the tenderness and he has gotten sharp.  She does have problems with constipation and she uses MiraLAX as needed.  She currently is on the DASH diet using prune juice and more fruits and vegetables which seems to be effective.  He has had a previous CT of her abdomen which showed diverticulosis.  She is also had a renal ultrasound which was  negative. Pain comes and goes. When it comes it may stay between 15 min to 1 hours. It hurt when she mashed over it. It is occasionally associated with back pain in the same side.   Her second area of pain is in her lower back.  She admits that the left side is greater than the right.  She admits that  20 years ago she had an episode which she had back pain and she rested for several days and to get better.  She denied any previous injury.  She denies any previous surgery, interventional therapy.  She does use home exercises.  In this CT scan of abdomen her lower back impression was completed.  Third area of pain is in her left ankle.  She admits that she has had previous torn tendon.    She admits that her balance is off.  She admits that this pain is just occasional. She did undergo surgery in 2015 with [facility], bunionectomy and hammertoe.  In considering the treatment plan options, Rebecca Anthony was reminded that I no longer take patients for medication management only. I asked her to let me know if she had no intention of taking advantage of the interventional therapies, so that we could make arrangements to provide this space to someone interested. I also made it clear that undergoing interventional therapies for the purpose of getting pain medications is very inappropriate on the part of a patient, and it will not be tolerated in this practice. This type of behavior would suggest true addiction and therefore it requires referral to an addiction specialist.   Further details on both, my assessment(s), as well as the proposed treatment plan, please see below.  Controlled Substance Pharmacotherapy Assessment REMS (Risk Evaluation and Mitigation Strategy)  Analgesic: None Highest recorded MME/day: 83 mg/day MME/day: 0 mg/day Pill Count: None expected due to no prior prescriptions written by our practice. No notes on file Pharmacokinetics: Liberation and absorption (onset of action): WNL Distribution (time to peak effect): WNL Metabolism and excretion (duration of action): WNL         Pharmacodynamics: Desired effects: Analgesia: Rebecca Anthony reports >50% benefit. Functional ability: Patient reports that medication allows her to accomplish basic ADLs Clinically meaningful improvement in  function (CMIF): Sustained CMIF goals met Perceived effectiveness: Described as relatively effective, allowing for increase in activities of daily living (ADL) Undesirable effects: Side-effects or Adverse reactions: None reported Monitoring:  PMP: Online review of the past 59-monthperiod previously conducted. Not applicable at this point since we have not taken over the patient's medication management yet. List of other Serum/Urine Drug Screening Test(s):  No results found. List of all UDS test(s) done:  Lab Results  Component Value Date   SUMMARY FINAL 10/31/2017   Last UDS on record: Summary  Date Value Ref Range Status  10/31/2017 FINAL  Final    Comment:    ==================================================================== TOXASSURE COMP DRUG ANALYSIS,UR ==================================================================== Test                             Result       Flag       Units Drug Present and Declared for Prescription Verification   Metoprolol                     PRESENT      EXPECTED Drug Absent but Declared for Prescription Verification   Salicylate  Not Detected UNEXPECTED    Aspirin, as indicated in the declared medication list, is not    always detected even when used as directed. ==================================================================== Test                      Result    Flag   Units      Ref Range   Creatinine              147              mg/dL      >=20 ==================================================================== Declared Medications:  The flagging and interpretation on this report are based on the  following declared medications.  Unexpected results may arise from  inaccuracies in the declared medications.  **Note: The testing scope of this panel includes these medications:  Metoprolol  **Note: The testing scope of this panel does not include small to  moderate amounts of these reported medications:  Aspirin  (Aspirin 81)  **Note: The testing scope of this panel does not include following  reported medications:  Alendronate (Fosamax)  Calcium carbonate  Levothyroxine  Polyethylene Glycol (MiraLAX)  Vitamin C ==================================================================== For clinical consultation, please call [phone]. ====================================================================    UDS interpretation: No unexpected findings.          Medication Assessment Form: Not applicable. No opioids. Treatment compliance: Not applicable Risk Assessment Profile: Aberrant behavior: See initial evaluations. None observed or detected today Comorbid factors increasing risk of overdose: See initial evaluation. No additional risks detected today  ORT Scoring interpretation table:  Score <3 = Low Risk for SUD  Score between 4-7 = Moderate Risk for SUD  Score >8 = High Risk for Opioid Abuse   Risk Mitigation Strategies:  Patient opioid safety counseling: No controlled substances prescribed. Patient-Prescriber Agreement (PPA): No agreement signed.  Controlled substance notification to other providers: None required. No opioid therapy.  Pharmacologic Plan: No opioid analgesic prescribed.             Laboratory Chemistry  Inflammation Markers (CRP: Acute Phase) (ESR: Chronic Phase) Lab Results  Component Value Date   CRP 3 10/31/2017   ESRSEDRATE 15 10/31/2017                         Rheumatology Markers No results found.  Renal Function Markers Lab Results  Component Value Date   BUN 15 10/31/2017   CREATININE 0.94 10/31/2017   BCR 16 10/31/2017   GFRAA 70 10/31/2017   GFRNONAA 60 10/31/2017                             Hepatic Function Markers Lab Results  Component Value Date   AST 26 10/31/2017   ALT 20 05/17/2017   ALBUMIN 4.6 10/31/2017   ALKPHOS 78 10/31/2017                        Electrolytes Lab Results  Component Value Date   NA 142 10/31/2017   K  4.3 10/31/2017   CL 103 10/31/2017   CALCIUM 10.5 (H) 10/31/2017   MG 2.2 10/31/2017                        Neuropathy Markers Lab Results  Component Value Date   VITAMINB12 670 10/31/2017   HGBA1C 5.7 11/25/2015  Bone Pathology Markers Lab Results  Component Value Date   VD25OH 37.4 05/17/2017   VD25OH 33.0 05/17/2017                         Coagulation Parameters Lab Results  Component Value Date   PLT 294 05/17/2017                        Cardiovascular Markers Lab Results  Component Value Date   HGB 13.5 05/17/2017   HCT 41.6 05/17/2017                         CA Markers No results found.  Note: Lab results reviewed.  Recent Diagnostic Imaging Review  Thoracic Imaging: Thoracic DG 2-3 views:  Results for orders placed during the hospital encounter of 10/31/17  DG Thoracic Spine 2 View   Narrative CLINICAL DATA:  Upper back pain and or thoracic spine pain  EXAM: THORACIC SPINE 2 VIEWS  COMPARISON:  None  FINDINGS: Osseous demineralization.  Biconvex thoracolumbar scoliosis.  12 pairs of ribs.  Minor endplate spur formation of the lower thoracic spine.  Vertebral body heights maintained without fracture or subluxation.  No bone destruction.  Atherosclerotic calcification aorta.  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  No acute abnormalities.   Electronically Signed   By: Lavonia Dana M.D.   On: 11/01/2017 09:07    Complexity Note: Imaging results reviewed. Results shared with Ms. Hosmer, using Layman's terms.                         Meds   Current Outpatient Medications:  .  alendronate (FOSAMAX) 70 MG tablet, TAKE 1 TABLET BY MOUTH ONCE A WEEK. TAKE WITH A FULL GLASS OF WATER ON AN EMPTY STOMACH, Disp: 13 tablet, Rfl: 0 .  aspirin 81 MG tablet, Take 1 tablet (81 mg total) by mouth daily. Do NOT take with Xarelto (just one or the other), Disp: 30 tablet, Rfl:  .   calcium carbonate (OSCAL) 1500 (600 Ca) MG TABS tablet, Take 1,500 mg by mouth daily with breakfast. , Disp: , Rfl:  .  [place] 300 MG CAPS, Take 1 capsule by mouth daily., Disp: , Rfl:  .  levothyroxine (SYNTHROID, LEVOTHROID) 25 MCG tablet, Take 1 tablet by mouth daily., Disp: , Rfl: 2 .  metoprolol succinate (TOPROL-XL) 50 MG 24 hr tablet, TAKE 1 TABLET BY MOUTH EVERY DAY, Disp: 30 tablet, Rfl: 11 .  polyethylene glycol powder (GLYCOLAX/MIRALAX) powder, Take 17 g by mouth daily. Dissolve 1 capful in 8 oz of liquid daily PRN, Disp: 3350 g, Rfl: 1 .  vitamin C (ASCORBIC ACID) 500 MG tablet, Take 500 mg by mouth daily., Disp: , Rfl:   ROS  Constitutional: Denies any fever or chills Gastrointestinal: No reported hemesis, hematochezia, vomiting, or acute GI distress Musculoskeletal: Denies any acute onset joint swelling, redness, loss of ROM, or weakness Neurological: No reported episodes of acute onset apraxia, aphasia, dysarthria, agnosia, amnesia, paralysis, loss of coordination, or loss of consciousness  Allergies  Ms. Runk is allergic to anaprox [naproxen sodium].  [place]  Drug: Ms. Canby  reports that she does not use drugs. Alcohol:  reports that she does not drink alcohol. Tobacco:  reports that she has never smoked. She has never used smokeless tobacco. Medical:  has a past medical history  of Dysuria, Heart murmur, Hematuria, unspecified, History of shingles, History of swelling of feet, Hypertension, Irritable bowel syndrome, Lichenification and lichen simplex chronicus, Localized superficial swelling, mass, or lump, Osteoarthrosis, unspecified whether generalized or localized, unspecified site, Osteoporosis, unspecified, PAF (paroxysmal atrial fibrillation) ([place]), Sinus problem, and Synovial cyst, unspecified. Surgical: Ms. Veazie  has a past surgical history that includes Appendectomy; Vesicovaginal fistula closure w/ TAH; AV fistula repair; Foot surgery (Left); Vaginal  hysterectomy; and Ankle surgery (Left). Family: family history includes Diabetes in her mother; Heart disease in her father; Heart failure in her mother; High blood pressure in her father; Hypertension in her father and mother; Thyroid disease in her sister.  Constitutional Exam  General appearance: Well nourished, well developed, and well hydrated. In no apparent acute distress Vitals:   11/28/17 1351  BP: (!) 169/74  Pulse: 66  Resp: 16  Temp: 98.2 F (36.8 C)  TempSrc: Oral  SpO2: 100%  Weight: 215 lb (97.5 kg)  Height: _0  (1.753 m)   BMI Assessment: Estimated body mass index is 31.75 kg/m as calculated from the following:   Height as of this encounter: _1  (1.753 m).   Weight as of this encounter: 215 lb (97.5 kg).  BMI interpretation table: BMI level Category Range association with higher incidence of chronic pain  <18 kg/m2 Underweight   18.5-24.9 kg/m2 Ideal body weight   25-29.9 kg/m2 Overweight Increased incidence by 20%  30-34.9 kg/m2 Obese (Class I) Increased incidence by 68%  35-39.9 kg/m2 Severe obesity (Class II) Increased incidence by 136%  >40 kg/m2 Extreme obesity (Class III) Increased incidence by 254%   Patient's current BMI Ideal Body weight  Body mass index is 31.75 kg/m. Ideal body weight: 66.2 kg (145 lb 15.1 oz) Adjusted ideal body weight: 78.7 kg (173 lb 9.1 oz)   BMI Readings from Last 4 Encounters:  11/28/17 31.75 kg/m  10/31/17 31.75 kg/m  09/13/17 32.30 kg/m  08/23/17 32.07 kg/m   Wt Readings from Last 4 Encounters:  11/28/17 215 lb (97.5 kg)  10/31/17 215 lb (97.5 kg)  09/13/17 218 lb 11.2 oz (99.2 kg)  08/23/17 217 lb 3.2 oz (98.5 kg)  Psych/Mental status: Alert, oriented x 3 (person, place, & time)       Eyes: PERLA Respiratory: No evidence of acute respiratory distress  Cervical Spine Area Exam  Skin & Axial Inspection: No masses, redness, edema, swelling, or associated skin lesions Alignment: Symmetrical Functional ROM:  Unrestricted ROM      Stability: No instability detected Muscle Tone/Strength: Functionally intact. No obvious neuro-muscular anomalies detected. Sensory (Neurological): Unimpaired Palpation: No palpable anomalies              Upper Extremity (UE) Exam    Side: Right upper extremity  Side: Left upper extremity  Skin & Extremity Inspection: Skin color, temperature, and hair growth are WNL. No peripheral edema or cyanosis. No masses, redness, swelling, asymmetry, or associated skin lesions. No contractures.  Skin & Extremity Inspection: Skin color, temperature, and hair growth are WNL. No peripheral edema or cyanosis. No masses, redness, swelling, asymmetry, or associated skin lesions. No contractures.  Functional ROM: Unrestricted ROM          Functional ROM: Unrestricted ROM          Muscle Tone/Strength: Functionally intact. No obvious neuro-muscular anomalies detected.  Muscle Tone/Strength: Functionally intact. No obvious neuro-muscular anomalies detected.  Sensory (Neurological): Unimpaired          Sensory (Neurological): Unimpaired  Palpation: No palpable anomalies              Palpation: No palpable anomalies              Provocative Test(s):  Phalen's test: deferred Tinel's test: deferred Apley's scratch test (touch opposite shoulder):  Action 1 (Across chest): deferred Action 2 (Overhead): deferred Action 3 (LB reach): deferred   Provocative Test(s):  Phalen's test: deferred Tinel's test: deferred Apley's scratch test (touch opposite shoulder):  Action 1 (Across chest): deferred Action 2 (Overhead): deferred Action 3 (LB reach): deferred    Thoracic Spine Area Exam  Skin & Axial Inspection: No masses, redness, or swelling Alignment: Symmetrical Functional ROM: Unrestricted ROM Stability: No instability detected Muscle Tone/Strength: Functionally intact. No obvious neuro-muscular anomalies detected. Sensory (Neurological): Unimpaired Muscle strength & Tone: No  palpable anomalies  Lumbar Spine Area Exam  Skin & Axial Inspection: No masses, redness, or swelling Alignment: Symmetrical Functional ROM: Unrestricted ROM       Stability: No instability detected Muscle Tone/Strength: Functionally intact. No obvious neuro-muscular anomalies detected. Sensory (Neurological): Unimpaired Palpation: No palpable anomalies       Provocative Tests: Hyperextension/rotation test: deferred today       Lumbar quadrant test (Kemp's test): deferred today       Lateral bending test: deferred today       Patrick's Maneuver: deferred today                   FABER test: deferred today                   S-I anterior distraction/compression test: deferred today         S-I lateral compression test: deferred today         S-I Thigh-thrust test: deferred today         S-I Gaenslen's test: deferred today          Gait & Posture Assessment  Ambulation: Unassisted Gait: Relatively normal for age and body habitus Posture: WNL   Lower Extremity Exam    Side: Right lower extremity  Side: Left lower extremity  Stability: No instability observed          Stability: No instability observed          Skin & Extremity Inspection: Skin color, temperature, and hair growth are WNL. No peripheral edema or cyanosis. No masses, redness, swelling, asymmetry, or associated skin lesions. No contractures.  Skin & Extremity Inspection: Skin color, temperature, and hair growth are WNL. No peripheral edema or cyanosis. No masses, redness, swelling, asymmetry, or associated skin lesions. No contractures.  Functional ROM: Unrestricted ROM                  Functional ROM: Unrestricted ROM                  Muscle Tone/Strength: Functionally intact. No obvious neuro-muscular anomalies detected.  Muscle Tone/Strength: Functionally intact. No obvious neuro-muscular anomalies detected.  Sensory (Neurological): Unimpaired  Sensory (Neurological): Unimpaired  Palpation: No palpable anomalies   Palpation: No palpable anomalies   Assessment & Plan  Primary Diagnosis & Pertinent Problem List: The primary encounter diagnosis was Chronic pain syndrome. Diagnoses of Chronic abdominal pain (LLQ) (Primary Area of Pain) (Left), Chronic low back pain (Secondary Area of Pain) (Bilateral) (L>R), Chronic upper back pain (Tertiary Area of Pain) (Bilateral) (L>R), Chronic ankle pain (Fourth Area of Pain) (Left), Other idiopathic scoliosis, thoracolumbar region, and Sigmoid diverticulosis were also pertinent  to this visit.  Visit Diagnosis: 1. Chronic pain syndrome   2. Chronic abdominal pain (LLQ) (Primary Area of Pain) (Left)   3. Chronic low back pain (Secondary Area of Pain) (Bilateral) (L>R)   4. Chronic upper back pain [facility] Area of Pain) (Bilateral) (L>R)   5. Chronic ankle pain (Fourth Area of Pain) (Left)   6. Other idiopathic scoliosis, thoracolumbar region   7. Sigmoid diverticulosis    Problems updated and reviewed during this visit: No problems updated.  Plan of Care  Pharmacotherapy (Medications Ordered): No orders of the defined types were placed in this encounter.   Procedure Orders     CELIAC PLEXUS BLOCK Lab Orders  No laboratory test(s) ordered today   Imaging Orders  No imaging studies ordered today   Referral Orders  No referral(s) requested today    Pharmacological management options:  Opioid Analgesics: We'll take over management today. See above orders Membrane stabilizer: We have discussed the possibility of optimizing this mode of therapy, if tolerated Muscle relaxant: We have discussed the possibility of a trial NSAID: We have discussed the possibility of a trial Other analgesic(s): To be determined at a later time   Interventional management options: Planned, scheduled, and/or pending:    Diagnostic celiac plexus nerve block #1, under fluoroscopy and IV sedation (PRN) Vs. diagnostic somatic nerve blocks    Considering:   Diagnostic celiac  plexus nerve block  Diagnostic bilateral lumbar facet nerve block  Possible bilateral lumbar facet radiofrequency ablation  Diagnostic left-sided thoracic facet nerve block  Possible left-sided thoracic facet radiofrequency ablation  Diagnostic trigger point injections    PRN Procedures:   None at this time   Provider-requested follow-up: Return for PRN Procedure (w/ sedation): (L) CPB #1.  Future Appointments  Date Time Provider [place]  12/19/2017  3:00 PM Velora [facility]  12/31/2017  2:00 PM Arnetha Courser, MD [place] [facility]  01/08/2018  4:00 PM Minna Merritts, MD [facility]  02/26/2018  3:30 PM Renato Shin, MD [facility] None    Primary Care Physician: Arnetha Courser, MD Location: [facility] Facility Note by: Gaspar Cola, MD Date: 11/28/2017; Time: 3:08 PM

## 2017-11-28 ENCOUNTER — Ambulatory Visit: Payer: Medicare Other | Attending: Pain Medicine | Admitting: Pain Medicine

## 2017-11-28 ENCOUNTER — Encounter: Payer: Self-pay | Admitting: Pain Medicine

## 2017-11-28 VITALS — BP 169/74 | HR 66 | Temp 98.2°F | Resp 16 | Ht 69.0 in | Wt 215.0 lb

## 2017-11-28 DIAGNOSIS — R9431 Abnormal electrocardiogram [ECG] [EKG]: Secondary | ICD-10-CM | POA: Insufficient documentation

## 2017-11-28 DIAGNOSIS — B351 Tinea unguium: Secondary | ICD-10-CM | POA: Insufficient documentation

## 2017-11-28 DIAGNOSIS — M4125 Other idiopathic scoliosis, thoracolumbar region: Secondary | ICD-10-CM | POA: Diagnosis not present

## 2017-11-28 DIAGNOSIS — I10 Essential (primary) hypertension: Secondary | ICD-10-CM | POA: Insufficient documentation

## 2017-11-28 DIAGNOSIS — M549 Dorsalgia, unspecified: Secondary | ICD-10-CM | POA: Diagnosis not present

## 2017-11-28 DIAGNOSIS — K5730 Diverticulosis of large intestine without perforation or abscess without bleeding: Secondary | ICD-10-CM | POA: Insufficient documentation

## 2017-11-28 DIAGNOSIS — G894 Chronic pain syndrome: Secondary | ICD-10-CM | POA: Diagnosis not present

## 2017-11-28 DIAGNOSIS — I499 Cardiac arrhythmia, unspecified: Secondary | ICD-10-CM | POA: Diagnosis not present

## 2017-11-28 DIAGNOSIS — M545 Low back pain, unspecified: Secondary | ICD-10-CM

## 2017-11-28 DIAGNOSIS — I480 Paroxysmal atrial fibrillation: Secondary | ICD-10-CM | POA: Insufficient documentation

## 2017-11-28 DIAGNOSIS — R1032 Left lower quadrant pain: Secondary | ICD-10-CM | POA: Diagnosis present

## 2017-11-28 DIAGNOSIS — M546 Pain in thoracic spine: Secondary | ICD-10-CM | POA: Diagnosis not present

## 2017-11-28 DIAGNOSIS — G8929 Other chronic pain: Secondary | ICD-10-CM

## 2017-11-28 DIAGNOSIS — E039 Hypothyroidism, unspecified: Secondary | ICD-10-CM | POA: Insufficient documentation

## 2017-11-28 DIAGNOSIS — M25572 Pain in left ankle and joints of left foot: Secondary | ICD-10-CM | POA: Insufficient documentation

## 2017-11-28 HISTORY — DX: Diverticulosis of large intestine without perforation or abscess without bleeding: K57.30

## 2017-11-28 NOTE — Patient Instructions (Addendum)
____________________________________________________________________________________________  Preparing for Procedure with Sedation  Instructions: . Oral Intake: Do not eat or drink anything for at least 8 hours prior to your procedure. . Transportation: Public transportation is not allowed. Bring an adult driver. The driver must be physically present in our waiting room before any procedure can be started. . Physical Assistance: Bring an adult physically capable of assisting you, in the event you need help. This adult should keep you company at home for at least 6 hours after the procedure. . Blood Pressure Medicine: Take your blood pressure medicine with a sip of water the morning of the procedure. . Blood thinners: Notify our staff if you are taking any blood thinners. Depending on which one you take, there will be specific instructions on how and when to stop it. . Diabetics on insulin: Notify the staff so that you can be scheduled 1st case in the morning. If your diabetes requires high dose insulin, take only  of your normal insulin dose the morning of the procedure and notify the staff that you have done so. . Preventing infections: Shower with an antibacterial soap the morning of your procedure. . Build-up your immune system: Take 1000 mg of Vitamin C with every meal (3 times a day) the day prior to your procedure. . Antibiotics: Inform the staff if you have a condition or reason that requires you to take antibiotics before dental procedures. . Pregnancy: If you are pregnant, call and cancel the procedure. . Sickness: If you have a cold, fever, or any active infections, call and cancel the procedure. . Arrival: You must be in the facility at least 30 minutes prior to your scheduled procedure. . Children: Do not bring children with you. . Dress appropriately: Bring dark clothing that you would not mind if they get stained. . Valuables: Do not bring any jewelry or valuables.  Procedure  appointments are reserved for interventional treatments only. . No Prescription Refills. . No medication changes will be discussed during procedure appointments. . No disability issues will be discussed.  Reasons to call and reschedule or cancel your procedure: (Following these recommendations will minimize the risk of a serious complication.) . Surgeries: Avoid having procedures within 2 weeks of any surgery. (Avoid for 2 weeks before or after any surgery). . Flu Shots: Avoid having procedures within 2 weeks of a flu shots or . (Avoid for 2 weeks before or after immunizations). . Barium: Avoid having a procedure within 7-10 days after having had a radiological study involving the use of radiological contrast. (Myelograms, Barium swallow or enema study). . Heart attacks: Avoid any elective procedures or surgeries for the initial 6 months after a "Myocardial Infarction" (Heart Attack). . Blood thinners: It is imperative that you stop these medications before procedures. Let us know if you if you take any blood thinner.  . Infection: Avoid procedures during or within two weeks of an infection (including chest colds or gastrointestinal problems). Symptoms associated with infections include: Localized redness, fever, chills, night sweats or profuse sweating, burning sensation when voiding, cough, congestion, stuffiness, runny nose, sore throat, diarrhea, nausea, vomiting, cold or Flu symptoms, recent or current infections. It is specially important if the infection is over the area that we intend to treat. . Heart and lung problems: Symptoms that may suggest an active cardiopulmonary problem include: cough, chest pain, breathing difficulties or shortness of breath, dizziness, ankle swelling, uncontrolled high or unusually low blood pressure, and/or palpitations. If you are experiencing any of these symptoms, cancel   your procedure and contact your primary care physician for an evaluation.  Remember:   Regular Business hours are:  Monday to Thursday 8:00 AM to 4:00 PM  Provider's Schedule: Charleene Callegari, MD:  Procedure days: Tuesday and Thursday 7:30 AM to 4:00 PM  Bilal Lateef, MD:  Procedure days: Monday and Wednesday 7:30 AM to 4:00 PM ____________________________________________________________________________________________   ____________________________________________________________________________________________  General Risks and Possible Complications  Patient Responsibilities: It is important that you read this as it is part of your informed consent. It is our duty to inform you of the risks and possible complications associated with treatments offered to you. It is your responsibility as a patient to read this and to ask questions about anything that is not clear or that you believe was not covered in this document.  Patient's Rights: You have the right to refuse treatment. You also have the right to change your mind, even after initially having agreed to have the treatment done. However, under this last option, if you wait until the last second to change your mind, you may be charged for the materials used up to that point.  Introduction: Medicine is not an exact science. Everything in Medicine, including the lack of treatment(s), carries the potential for danger, harm, or loss (which is by definition: Risk). In Medicine, a complication is a secondary problem, condition, or disease that can aggravate an already existing one. All treatments carry the risk of possible complications. The fact that a side effects or complications occurs, does not imply that the treatment was conducted incorrectly. It must be clearly understood that these can happen even when everything is done following the highest safety standards.  No treatment: You can choose not to proceed with the proposed treatment alternative. The "PRO(s)" would include: avoiding the risk of complications associated  with the therapy. The "CON(s)" would include: not getting any of the treatment benefits. These benefits fall under one of three categories: diagnostic; therapeutic; and/or palliative. Diagnostic benefits include: getting information which can ultimately lead to improvement of the disease or symptom(s). Therapeutic benefits are those associated with the successful treatment of the disease. Finally, palliative benefits are those related to the decrease of the primary symptoms, without necessarily curing the condition (example: decreasing the pain from a flare-up of a chronic condition, such as incurable terminal cancer).  General Risks and Complications: These are associated to most interventional treatments. They can occur alone, or in combination. They fall under one of the following six (6) categories: no benefit or worsening of symptoms; bleeding; infection; nerve damage; allergic reactions; and/or death. 1. No benefits or worsening of symptoms: In Medicine there are no guarantees, only probabilities. No healthcare provider can ever guarantee that a medical treatment will work, they can only state the probability that it may. Furthermore, there is always the possibility that the condition may worsen, either directly, or indirectly, as a consequence of the treatment. 2. Bleeding: This is more common if the patient is taking a blood thinner, either prescription or over the counter (example: Goody Powders, Fish oil, Aspirin, Garlic, etc.), or if suffering a condition associated with impaired coagulation (example: Hemophilia, cirrhosis of the liver, low platelet counts, etc.). However, even if you do not have one on these, it can still happen. If you have any of these conditions, or take one of these drugs, make sure to notify your treating physician. 3. Infection: This is more common in patients with a compromised immune system, either due to disease (example: diabetes, cancer,   human immunodeficiency virus  [HIV], etc.), or due to medications or treatments (example: therapies used to treat cancer and rheumatological diseases). However, even if you do not have one on these, it can still happen. If you have any of these conditions, or take one of these drugs, make sure to notify your treating physician. 4. Nerve Damage: This is more common when the treatment is an invasive one, but it can also happen with the use of medications, such as those used in the treatment of cancer. The damage can occur to small secondary nerves, or to large primary ones, such as those in the spinal cord and brain. This damage may be temporary or permanent and it may lead to impairments that can range from temporary numbness to permanent paralysis and/or brain death. 5. Allergic Reactions: Any time a substance or material comes in contact with our body, there is the possibility of an allergic reaction. These can range from a mild skin rash (contact dermatitis) to a severe systemic reaction (anaphylactic reaction), which can result in death. 6. Death: In general, any medical intervention can result in death, most of the time due to an unforeseen complication. ____________________________________________________________________________________________  Celiac Plexus Block  Benefiber  Thoracic Epidural Steroid Injection  Celiac Plexus Block Patient Information  Description: The celiac plexus is a group of nerves which are part of the sympathetic nervous system.  These nerves supply organs in the abdomen and pelvis.  Specific organs supplied with sensation by the celiac plexus include the stomach, liver, gallbladder, pancreas, kidneys and part of the gut.   The celiac plexus is located on both sides of the aorta at approximately the level of the first lumbar vertebral body.  The block will be performed with you lying on your abdomen with a pillow underneath.  Using direct x-ray guidance, the celiac plexus will be located on both  sides of the spine.  Numbing medicine will be used to deaden the skin prior to needle insertion.  In most cases, a small amount of sedation can be given by IV prior to the numbing medicine.  Two small needles will be place near the celiac plexus and local anesthetic and steroid will be injected.  The entire block usually last about 15-25 minutes.  Conditions which may be treated by celiac plexus block:   Acute and chronic pancreatitis  Pain from liver or pancreatic cancer  Pain from Crohn's disease  Other types of abdominal or flank pain  Preparation for the injection:  1. Do not eat any solid food or dairy products within 8 hours of your appointment. 2. You may drink clear liquids up to 3 hours before appointment.  Clear liquids include water, black coffee, juice or soda.  No milk or cream please. 3. You may take your regular medication, including pain medications, with a sip of water before your appointment.  Diabetics should hold regular insulin (if taken separately) and take 1/2 normal NPH dose in the morning of the procedure.  Carry some sugar containing items with you to your appointment. 4. A driver must accompany you and be prepared to drive you home after your procedure. 5. Bring all your current medications with you. 6. An IV may be inserted and sedation may be given at the discretion of the physician. 7. A blood pressure cuff, EKG, and other monitors will often be applied during the procedure.  Some patients may need to have extra oxygen administered for a short period. 8. You will be asked to provide  medical information, including your allergies and medications, prior to the procedure.  We must know immediately if you are taking blood thinners (like Coumadin/Warfarin) or if you are allergic to IV iodine contrast (dye).  We must know if you could possible be pregnant.  Possible side-effects:   Bleeding from needle site or deeper  Infection (rarre, can require surgery)  Nerve  injury (rare)  Numbness & tingling (temporary)  Collapsed lung (rare)  Spinal headache ( a headache worse with upright posture)  Light-headedness (temporary)  Pain at injection site (several days)  Decreased blood pressure (temporary)  Weakness in legs (temporary)  Seizure or other drug reaction (rare)  Call if you experience:   Fever/chills associated with headache or increased back/neck pain  Headache worsened by an upright position  New onset weakness or numbness of an extremity below the injection site.  Hives or difficulty breathing (go to the emergency room)  Inflammation or drainage at the injection site.  New onset diarrhea lasting more than 2 weeks.  New symptoms which are concerning to you  Please note:  If effective, we will often do a series of 2-3 injections spaced 3-6 weeks apart to maximally decrease your pain.  If initial series is effective, you may be a candidate for a more permanent block of the celiac plexus. .  If you have questions, please call [phone] [facility]     Diverticulosis Diverticulosis is a condition that develops when small pouches (diverticula) form in the wall of the large intestine (colon). The colon is where water is absorbed and stool is formed. The pouches form when the inside layer of the colon pushes through weak spots in the outer layers of the colon. You may have a few pouches or many of them. What are the causes? The cause of this condition is not known. What increases the risk? The following factors may make you more likely to develop this condition:  Being older than age 48. Your risk for this condition increases with age. Diverticulosis is rare among people younger than age 12. By age 69, many people have it.  Eating a low-fiber diet.  Having frequent constipation.  Being overweight.  Not getting enough exercise.  Smoking.  Taking over-the-counter pain medicines,  like aspirin and ibuprofen.  Having a family history of diverticulosis.  What are the signs or symptoms? In most people, there are no symptoms of this condition. If you do have symptoms, they may include:  Bloating.  Cramps in the abdomen.  Constipation or diarrhea.  Pain in the lower left side of the abdomen.  How is this diagnosed? This condition is most often diagnosed during an exam for other colon problems. Because diverticulosis usually has no symptoms, it often cannot be diagnosed independently. This condition may be diagnosed by:  Using a flexible scope to examine the colon (colonoscopy).  Taking an X-ray of the colon after dye has been put into the colon (barium enema).  Doing a CT scan.  How is this treated? You may not need treatment for this condition if you have never developed an infection related to diverticulosis. If you have had an infection before, treatment may include:  Eating a high-fiber diet. This may include eating more fruits, vegetables, and grains.  Taking a fiber supplement.  Taking a live bacteria supplement (probiotic).  Taking medicine to relax your colon.  Taking antibiotic medicines.  Follow these instructions at home:  Drink 6-8 glasses of water or more  each day to prevent constipation.  Try not to strain when you have a bowel movement.  If you have had an infection before: ? Eat more fiber as directed by your health care provider or your diet and nutrition specialist (dietitian). ? Take a fiber supplement or probiotic, if your health care provider approves.  Take over-the-counter and prescription medicines only as told by your health care provider.  If you were prescribed an antibiotic, take it as told by your health care provider. Do not stop taking the antibiotic even if you start to feel better.  Keep all follow-up visits as told by your health care provider. This is important. Contact a health care provider if:  You have  pain in your abdomen.  You have bloating.  You have cramps.  You have not had a bowel movement in 3 days. Get help right away if:  Your pain gets worse.  Your bloating becomes very bad.  You have a fever or chills, and your symptoms suddenly get worse.  You vomit.  You have bowel movements that are bloody or black.  You have bleeding from your rectum. Summary  Diverticulosis is a condition that develops when small pouches (diverticula) form in the wall of the large intestine (colon).  You may have a few pouches or many of them.  This condition is most often diagnosed during an exam for other colon problems.  If you have had an infection related to diverticulosis, treatment may include increasing the fiber in your diet, taking supplements, or taking medicines. This information is not intended to replace advice given to you by your health care provider. Make sure you discuss any questions you have with your health care provider. Document Released: 12/30/2003 Document Revised: 02/21/2016 Document Reviewed: 02/21/2016 Elsevier Interactive Patient Education  2017 [employer].

## 2017-12-03 ENCOUNTER — Ambulatory Visit: Payer: Medicare Other | Admitting: Family Medicine

## 2017-12-19 ENCOUNTER — Encounter: Payer: Self-pay | Admitting: Podiatry

## 2017-12-19 ENCOUNTER — Other Ambulatory Visit: Payer: Medicare Other | Admitting: Orthotics

## 2017-12-26 ENCOUNTER — Other Ambulatory Visit: Payer: Medicare Other | Admitting: Orthotics

## 2017-12-31 ENCOUNTER — Encounter: Payer: Self-pay | Admitting: Family Medicine

## 2017-12-31 ENCOUNTER — Ambulatory Visit: Payer: Medicare Other | Admitting: Family Medicine

## 2017-12-31 VITALS — BP 126/64 | HR 99 | Temp 98.2°F | Ht 69.0 in | Wt 219.3 lb

## 2017-12-31 DIAGNOSIS — R2232 Localized swelling, mass and lump, left upper limb: Secondary | ICD-10-CM

## 2017-12-31 DIAGNOSIS — I480 Paroxysmal atrial fibrillation: Secondary | ICD-10-CM

## 2017-12-31 DIAGNOSIS — R1032 Left lower quadrant pain: Secondary | ICD-10-CM

## 2017-12-31 DIAGNOSIS — M5414 Radiculopathy, thoracic region: Secondary | ICD-10-CM

## 2017-12-31 DIAGNOSIS — M7989 Other specified soft tissue disorders: Secondary | ICD-10-CM

## 2017-12-31 DIAGNOSIS — R3129 Other microscopic hematuria: Secondary | ICD-10-CM

## 2017-12-31 DIAGNOSIS — E039 Hypothyroidism, unspecified: Secondary | ICD-10-CM

## 2017-12-31 DIAGNOSIS — I10 Essential (primary) hypertension: Secondary | ICD-10-CM | POA: Diagnosis not present

## 2017-12-31 DIAGNOSIS — K5730 Diverticulosis of large intestine without perforation or abscess without bleeding: Secondary | ICD-10-CM

## 2017-12-31 DIAGNOSIS — G8929 Other chronic pain: Secondary | ICD-10-CM

## 2017-12-31 DIAGNOSIS — R635 Abnormal weight gain: Secondary | ICD-10-CM

## 2017-12-31 DIAGNOSIS — E669 Obesity, unspecified: Secondary | ICD-10-CM

## 2017-12-31 DIAGNOSIS — Z23 Encounter for immunization: Secondary | ICD-10-CM | POA: Diagnosis not present

## 2017-12-31 NOTE — Assessment & Plan Note (Signed)
Controlled today 

## 2017-12-31 NOTE — Assessment & Plan Note (Signed)
Seeing cardiologist 

## 2017-12-31 NOTE — Assessment & Plan Note (Signed)
Check TSH; avoid over-replacement given a-fib diagnosis

## 2017-12-31 NOTE — Patient Instructions (Addendum)
Check out the information at familydoctor.org entitled "Nutrition for Weight Loss: What You Need to Know about Fad Diets" Try to lose between 1-2 pounds per week by taking in fewer calories and burning off more calories You can succeed by limiting portions, limiting foods dense in calories and fat, becoming more active, and drinking 8 glasses of water a day (64 ounces) Don't skip meals, especially breakfast, as skipping meals may alter your metabolism Do not use over-the-counter weight loss pills or gimmicks that claim rapid weight loss A healthy BMI (or body mass index) is between 18.5 and 24.9 You can calculate your ideal BMI at the [employer] website [email or web address]  Please call [phone] to schedule your imaging tests Please wait 2-3 days after the order has been placed to call and get your test scheduled

## 2017-12-31 NOTE — Assessment & Plan Note (Addendum)
Encouragement given for weight loss 

## 2017-12-31 NOTE — Progress Notes (Signed)
BP 126/64   Pulse 99   Temp 98.2 F (36.8 C)   Ht 5\' 9"  (1.753 m)   Wt 219 lb 4.8 oz (99.5 kg)   SpO2 99%   BMI 32.38 kg/m    Subjective:    [name], female    DOB: 01-Jan-1946, 72 y.o.   MRN: [id number]  HPI: Rebecca Anthony is a 72 y.o. female  Chief Complaint  Patient presents with  . Follow-up    HPI Patient is here for f/u; here with her husband  HTN; well-controlled; chronic problem; trying to follow the DASH guidelines; just started that within the last 4 weeks or so  Paroxysmal atrial fibrillation; chronic problem; seeing cardiologist; on toprol; does feel strong heart beat at night sometimes; if she is rushing or working hard, she'll feel it more, then calms down and it comes back down; saw Dr. Ellyn Hack years ago; going to see Dr. Rockey Situ on Sept 24th; no chest pain; on aspirin to prevent stroke; she decided to not start the Xarelto and wants to wait on him; still taking 81 mg aspirin; no bleeding from nose, gums, urine, or rectum  Hypothyroidism; chronic problem; she has been on the current dose of thyroid medicine for several months; managed by Dr. Loanne Drilling; just diagnosed last fall Lab Results  Component Value Date   TSH 2.28 12/31/2017   Obesity; chronic problem; she has cut out sweets; trying to lose weight and frustrated with lack of results; she could drink more water  She has been to the pain clinic; does not want to do the block; she did not have a good day yesterday; level is above the ASIS on the LEFT; felt headache and nausea; slept a lot Did urine in the urologist's office, he cleared her  She had a lipoma or something in her left axilla and she wonders if it has grown; had [place] already done of it  Depression screen [facility] 2/9 01/03/2018 12/31/2017 10/31/2017 10/31/2017 09/13/2017  Decreased Interest 0 0 0 0 0  Down, Depressed, Hopeless 0 0 0 0 0  PHQ - 2 Score 0 0 0 0 0  Altered sleeping 0 - - - 0  Tired, decreased energy 0 - - - 1  Change  in appetite 0 - - - 0  Feeling bad or failure about yourself  0 - - - 0  Trouble concentrating 0 - - - 0  Moving slowly or fidgety/restless 0 - - - 0  Suicidal thoughts 0 - - - 0  PHQ-9 Score 0 - - - 1  Difficult doing work/chores Not difficult at all - - - Not difficult at all    Relevant past medical, surgical, family and social history reviewed Past Medical History:  Diagnosis Date  . Dysuria   . Heart murmur   . Hematuria, unspecified   . History of shingles   . History of swelling of feet   . Hypertension   . Irritable bowel syndrome   . Lichenification and lichen simplex chronicus   . Localized superficial swelling, mass, or lump   . Osteoarthrosis, unspecified whether generalized or localized, unspecified site   . Osteoporosis, unspecified   . PAF (paroxysmal atrial fibrillation) ([place])    Diagnosed years ago -- originally on Quinidine  . Sinus problem   . Synovial cyst, unspecified    Past Surgical History:  Procedure Laterality Date  . ANKLE SURGERY Left   . APPENDECTOMY    . AV FISTULA  REPAIR    . FOOT SURGERY Left   . VAGINAL HYSTERECTOMY    . VESICOVAGINAL FISTULA CLOSURE W/ TAH     Family History  Problem Relation Age of Onset  . Diabetes Mother   . Hypertension Mother   . Heart failure Mother   . Heart disease Father   . Hypertension Father   . Thyroid disease Sister   . Healthy Sister   . Breast cancer Neg Hx    Social History   Tobacco Use  . Smoking status: Never Smoker  . Smokeless tobacco: Never Used  . Tobacco comment: smoking cessation materials not required  Substance Use Topics  . Alcohol use: No  . Drug use: No    Interim medical history since last visit reviewed. Allergies and medications reviewed  Review of Systems Per HPI unless specifically indicated above     Objective:    BP 126/64   Pulse 99   Temp 98.2 F (36.8 C)   Ht 5\' 9"  (1.753 m)   Wt 219 lb 4.8 oz (99.5 kg)   SpO2 99%   BMI 32.38 kg/m   Wt Readings from  Last 3 Encounters:  01/03/18 218 lb 8 oz (99.1 kg)  12/31/17 219 lb 4.8 oz (99.5 kg)  11/28/17 215 lb (97.5 kg)    Physical Exam  Constitutional: She appears well-developed and well-nourished. No distress.  obese  HENT:  Head: Normocephalic and atraumatic.  Eyes: EOM are normal. No scleral icterus.  Neck: No thyromegaly present.  Cardiovascular: Normal rate, regular rhythm and normal heart sounds.  No murmur heard. Pulmonary/Chest: Effort normal and breath sounds normal. No respiratory distress. She has no wheezes.  Soft lipomatous mass LEFT axilla    Abdominal: Soft. Bowel sounds are normal. She exhibits no distension.  Tenderness left side abd, positive Carnett's sign  Musculoskeletal: She exhibits no edema.  Neurological: She is alert.  Skin: Skin is warm and dry. She is not diaphoretic. No pallor.  Psychiatric: She has a normal mood and affect. Her behavior is normal. Judgment and thought content normal. Her mood appears not anxious. She does not exhibit a depressed mood.      Assessment & Plan:   Problem List Items Addressed This Visit      Cardiovascular and Mediastinum   PAF (paroxysmal atrial fibrillation) (HCC) (Chronic)    Seeing cardiologist      Hypertension - Primary (Chronic)    Controlled today      Relevant Orders   Lipid panel (Completed)     Endocrine   Hypothyroidism (Chronic)    Check TSH; avoid over-replacement given a-fib diagnosis      Relevant Orders   TSH (Completed)     Genitourinary   Hematuria, microscopic   Relevant Orders   MR Abdomen W Wo Contrast     Other   LLQ abdominal pain   Relevant Orders   MR Abdomen W Wo Contrast   CBC with Differential/Platelet (Completed)   COMPLETE METABOLIC PANEL WITH GFR (Completed)   Sigmoid diverticulosis (Chronic)   Relevant Orders   MR Abdomen W Wo Contrast   Chronic abdominal pain (LLQ) (Primary Area of Pain) (Left) (Chronic)   Relevant Orders   MR Abdomen W Wo Contrast   Obesity (BMI  30.0-34.9)    Encouragement given for weight loss       Other Visit Diagnoses    Weight gain       Relevant Orders   TSH (Completed)   Need for influenza  vaccination       Relevant Orders   Flu vaccine HIGH DOSE PF (Fluzone High dose) (Completed)   Radiculopathy, thoracic region       Relevant Orders   MR Thoracic Spine Wo Contrast   Left lower quadrant pain       Relevant Orders   MR Abdomen W Wo Contrast   Mass of soft tissue of left upper extremity       Relevant Orders   [place] AXILLA LEFT       Follow up plan: No follow-ups on file.  An after-visit summary was printed and given to the patient at [place].  Please see the patient instructions which may contain other information and recommendations beyond what is mentioned above in the assessment and plan.  No orders of the defined types were placed in this encounter.   Orders Placed This Encounter  Procedures  . MR Thoracic Spine Wo Contrast  . MR Abdomen W Wo Contrast  . [place] AXILLA LEFT  . Flu vaccine HIGH DOSE PF (Fluzone High dose)  . TSH  . CBC with Differential/Platelet  . COMPLETE METABOLIC PANEL WITH GFR  . Lipid panel

## 2018-01-01 ENCOUNTER — Encounter: Payer: Self-pay | Admitting: Family Medicine

## 2018-01-01 ENCOUNTER — Telehealth: Payer: Self-pay

## 2018-01-01 ENCOUNTER — Other Ambulatory Visit: Payer: Self-pay | Admitting: Family Medicine

## 2018-01-01 ENCOUNTER — Ambulatory Visit: Payer: Medicare Other | Admitting: Pain Medicine

## 2018-01-01 DIAGNOSIS — R2232 Localized swelling, mass and lump, left upper limb: Secondary | ICD-10-CM

## 2018-01-01 DIAGNOSIS — Z1239 Encounter for other screening for malignant neoplasm of breast: Secondary | ICD-10-CM

## 2018-01-01 DIAGNOSIS — E782 Mixed hyperlipidemia: Secondary | ICD-10-CM

## 2018-01-01 LAB — CBC WITH DIFFERENTIAL/PLATELET
BASOS ABS: 29 {cells}/uL (ref 0–200)
Basophils Relative: 0.5 %
EOS ABS: 108 {cells}/uL (ref 15–500)
Eosinophils Relative: 1.9 %
HEMATOCRIT: 44 % (ref 35.0–45.0)
HEMOGLOBIN: 14.4 g/dL (ref 11.7–15.5)
LYMPHS ABS: 1590 {cells}/uL (ref 850–3900)
MCH: 27.8 pg (ref 27.0–33.0)
MCHC: 32.7 g/dL (ref 32.0–36.0)
MCV: 84.9 fL (ref 80.0–100.0)
MPV: 9.4 fL (ref 7.5–12.5)
Monocytes Relative: 11.2 %
NEUTROS ABS: 3335 {cells}/uL (ref 1500–7800)
Neutrophils Relative %: 58.5 %
Platelets: 308 10*3/uL (ref 140–400)
RBC: 5.18 10*6/uL — ABNORMAL HIGH (ref 3.80–5.10)
RDW: 12.4 % (ref 11.0–15.0)
Total Lymphocyte: 27.9 %
WBC: 5.7 10*3/uL (ref 3.8–10.8)
WBCMIX: 638 {cells}/uL (ref 200–950)

## 2018-01-01 LAB — COMPLETE METABOLIC PANEL WITH GFR
AG RATIO: 2 (calc) (ref 1.0–2.5)
ALT: 15 U/L (ref 6–29)
AST: 18 U/L (ref 10–35)
Albumin: 4.6 g/dL (ref 3.6–5.1)
Alkaline phosphatase (APISO): 66 U/L (ref 33–130)
BUN: 21 mg/dL (ref 7–25)
CALCIUM: 9.9 mg/dL (ref 8.6–10.4)
CO2: 29 mmol/L (ref 20–32)
CREATININE: 0.87 mg/dL (ref 0.60–0.93)
Chloride: 104 mmol/L (ref 98–110)
GFR, EST NON AFRICAN AMERICAN: 66 mL/min/{1.73_m2} (ref >=60)
GFR, Est African American: 77 mL/min/{1.73_m2} (ref >=60)
GLOBULIN: 2.3 g/dL (ref 1.9–3.7)
Glucose, Bld: 88 mg/dL (ref 65–99)
Potassium: 4.7 mmol/L (ref 3.5–5.3)
Sodium: 140 mmol/L (ref 135–146)
TOTAL PROTEIN: 6.9 g/dL (ref 6.1–8.1)
Total Bilirubin: 0.7 mg/dL (ref 0.2–1.2)

## 2018-01-01 LAB — LIPID PANEL
CHOL/HDL RATIO: 2.9 (calc) (ref <5.0)
CHOLESTEROL: 218 mg/dL — AB (ref <200)
HDL: 74 mg/dL (ref >50)
LDL Cholesterol (Calc): 128 mg/dL (calc) — ABNORMAL HIGH
NON-HDL CHOLESTEROL (CALC): 144 mg/dL — AB (ref <130)
Triglycerides: 67 mg/dL (ref <150)

## 2018-01-01 LAB — TSH: TSH: 2.28 mIU/L (ref 0.40–4.50)

## 2018-01-01 MED ORDER — ATORVASTATIN CALCIUM 20 MG PO TABS: 20.0000 mg | ORAL_TABLET | Freq: Every day | ORAL | 1 refills | Status: DC

## 2018-01-01 NOTE — Telephone Encounter (Signed)
Copied from [place] [id number]. Topic: Inquiry >> Jan 01, 2018  2:11 PM Nils Flack, Marland Kitchen wrote: Reason for CRM:  Spoke to [facility], patient is due for her annual.  Orders for IMG 5535 needs to be put in.  Ultrasound order needs to be changed to IMG 5531 for left, and IMG 5532 for tight  Thank you

## 2018-01-01 NOTE — Progress Notes (Signed)
Recommend statin; recheck lipids in 6 weeks

## 2018-01-01 NOTE — Telephone Encounter (Signed)
Pt is requesting the RX sent to [facility] be cancelled for the time being as she is going to try diet first. Pt states [facility] calls nonstop when they have RX on hold/filled. She just wants to have it cancelled altogether.   Ph # [phone]

## 2018-01-02 NOTE — Addendum Note (Signed)
Addended by: Zyra Parrillo, Satira Anis on: 01/02/2018 04:50 PM   Modules accepted: Orders

## 2018-01-02 NOTE — Telephone Encounter (Signed)
Spoke to Rebecca Anthony, they are now saying that it is too early for the pt's annual.  They are asking for IMG [id number] Diagnostic Uni Left to be  Put in  Thanks

## 2018-01-02 NOTE — Telephone Encounter (Signed)
I'm happy to speak to their director if needed I want an US of the left axilla

## 2018-01-02 NOTE — Telephone Encounter (Signed)
I originally ordered an US of the left axilla I need to make sure they are going to look at what I originally ordered and I'm going to ask them to NOT do anything that's unnecessary

## 2018-01-03 ENCOUNTER — Ambulatory Visit: Payer: Medicare Other | Admitting: Pain Medicine

## 2018-01-03 ENCOUNTER — Ambulatory Visit (INDEPENDENT_AMBULATORY_CARE_PROVIDER_SITE_OTHER): Payer: Medicare Other

## 2018-01-03 VITALS — BP 124/70 | HR 56 | Temp 97.7°F | Resp 12 | Ht 69.0 in | Wt 218.5 lb

## 2018-01-03 DIAGNOSIS — Z1231 Encounter for screening mammogram for malignant neoplasm of breast: Secondary | ICD-10-CM

## 2018-01-03 DIAGNOSIS — Z1239 Encounter for other screening for malignant neoplasm of breast: Secondary | ICD-10-CM

## 2018-01-03 DIAGNOSIS — E2839 Other primary ovarian failure: Secondary | ICD-10-CM

## 2018-01-03 DIAGNOSIS — Z0000 Encounter for general adult medical examination without abnormal findings: Secondary | ICD-10-CM

## 2018-01-03 NOTE — Progress Notes (Signed)
Subjective:   Rebecca Anthony is a 72 y.o. female who presents for Medicare Annual (Subsequent) preventive examination.  Review of Systems:  N/A Cardiac Risk Factors include: advanced age (>69men, >37 women);dyslipidemia;hypertension;obesity (BMI >30kg/m2);sedentary lifestyle     Objective:     Vitals: BP 124/70 (BP Location: Left Arm, Patient Position: Sitting, Cuff Size: Normal)   Pulse (!) 56   Temp 97.7 F (36.5 C) (Oral)   Resp 12   Ht 5\' 9"  (1.753 m)   Wt 218 lb 8 oz (99.1 kg)   SpO2 97%   BMI 32.27 kg/m   Body mass index is 32.27 kg/m.  Advanced Directives 01/03/2018 02/14/2017 01/29/2017 09/06/2016 03/23/2016 11/25/2015  Does Patient Have a Medical Advance Directive? Yes No No No No No  Type of Paramedic of [place];Living will - - - - -  Copy of [place] in Chart? No - copy requested - - - - -  Would patient like information on creating a medical advance directive? - - - - - No - patient declined information    Tobacco Social History   Tobacco Use  Smoking Status Never Smoker  Smokeless Tobacco Never Used  Tobacco Comment   smoking cessation materials not required     Counseling given: No Comment: smoking cessation materials not required  Clinical Intake:  Pre-visit preparation completed: Yes  Pain : No/denies pain   BMI - recorded: 32.27 Nutritional Status: BMI > 30  Obese Nutritional Risks: None Diabetes: No  How often do you need to have someone help you when you read instructions, pamphlets, or other written materials from your doctor or pharmacy?: 1 - Never  Interpreter Needed?: No  Information entered by :: AEversole, LPN  Past Medical History:  Diagnosis Date  . Dysuria   . Heart murmur   . Hematuria, unspecified   . History of shingles   . History of swelling of feet   . Hypertension   . Irritable bowel syndrome   . Lichenification and lichen simplex chronicus   . Localized superficial  swelling, mass, or lump   . Osteoarthrosis, unspecified whether generalized or localized, unspecified site   . Osteoporosis, unspecified   . PAF (paroxysmal atrial fibrillation) ([place])    Diagnosed years ago -- originally on Quinidine  . Sinus problem   . Synovial cyst, unspecified    Past Surgical History:  Procedure Laterality Date  . ANKLE SURGERY Left   . APPENDECTOMY    . AV FISTULA REPAIR    . FOOT SURGERY Left   . VAGINAL HYSTERECTOMY    . VESICOVAGINAL FISTULA CLOSURE W/ TAH     Family History  Problem Relation Age of Onset  . Diabetes Mother   . Hypertension Mother   . Heart failure Mother   . Heart disease Father   . Hypertension Father   . Thyroid disease Sister   . Healthy Sister   . Breast cancer Neg Hx    Social History   Socioeconomic History  . Marital status: Married    Spouse name: [name]  . Number of children: 1  . Years of education: Not on file  . Highest education level: Bachelor's degree (e.g., BA, AB, BS)  Occupational History  . Occupation: Retired  Scientific laboratory technician  . Financial resource strain: Not hard at all  . Food insecurity:    Worry: Never true    Inability: Never true  . Transportation needs:    Medical: No  Non-medical: No  Tobacco Use  . Smoking status: Never Smoker  . Smokeless tobacco: Never Used  . Tobacco comment: smoking cessation materials not required  Substance and Sexual Activity  . Alcohol use: No  . Drug use: No  . Sexual activity: Yes    Birth control/protection: None, Surgical  Lifestyle  . Physical activity:    Days per week: 0 days    Minutes per session: 0 min  . Stress: Not at all  Relationships  . Social connections:    Talks on phone: Patient refused    Gets together: Patient refused    Attends religious service: Patient refused    Active member of club or organization: Patient refused    Attends meetings of clubs or organizations: Patient refused    Relationship status: Married  Other Topics Concern   . Not on file  Social History Narrative   Recently moved from [facility] back to [facility].    Outpatient Encounter Medications as of 01/03/2018  Medication Sig  . alendronate (FOSAMAX) 70 MG tablet TAKE 1 TABLET BY MOUTH ONCE A WEEK. TAKE WITH A FULL GLASS OF WATER ON AN EMPTY STOMACH  . aspirin 81 MG tablet Take 1 tablet (81 mg total) by mouth daily. Do NOT take with Xarelto (just one or the other)  . calcium carbonate (OSCAL) 1500 (600 Ca) MG TABS tablet Take 1,500 mg by mouth daily with breakfast.   . Horse Chestnut 300 MG CAPS Take 1 capsule by mouth daily.  Marland Kitchen levothyroxine (SYNTHROID, LEVOTHROID) 25 MCG tablet Take 1 tablet by mouth daily.  . metoprolol succinate (TOPROL-XL) 50 MG 24 hr tablet TAKE 1 TABLET BY MOUTH EVERY DAY  . polyethylene glycol powder (GLYCOLAX/MIRALAX) powder Take 17 g by mouth daily. Dissolve 1 capful in 8 oz of liquid daily PRN  . vitamin C (ASCORBIC ACID) 500 MG tablet Take 500 mg by mouth daily.   No facility-administered encounter medications on file as of 01/03/2018.     Activities of Daily Living In your present state of health, do you have any difficulty performing the following activities: 01/03/2018 12/31/2017  Hearing? N N  Comment denies hearing aids -  Vision? N N  Comment wears eyeglasses, cataracts -  Difficulty concentrating or making decisions? N N  Walking or climbing stairs? Y N  Comment back pain -  Dressing or bathing? N N  Doing errands, shopping? N N  Preparing Food and eating ? N -  Comment partial upper dentures -  Using the Toilet? N -  In the past six months, have you accidently leaked urine? N -  Do you have problems with loss of bowel control? N -  Managing your Medications? N -  Managing your Finances? N -  Housekeeping or managing your Housekeeping? N -  Some recent data might be hidden    Patient Care Team: Lada, Satira Anis, MD as PCP - General (Family Medicine) Birder Robson, MD as Consulting Physician (Ophthalmology) Renato Shin, MD as Consulting Physician (Endocrinology) Minna Merritts, MD as Consulting Physician (Cardiology) Carloyn Manner, MD as Consulting Physician (Otolaryngology)    Assessment:   This is a routine wellness examination for Erza.  Exercise Activities and Dietary recommendations Current Exercise Habits: The patient does not participate in regular exercise at present, Exercise limited by: None identified  Goals    . DIET - REDUCE SODIUM INTAKE     Recommend to begin DASH diet (Diet attached to AVS).  Fall Risk Fall Risk  01/03/2018 12/31/2017 11/28/2017 10/31/2017 09/13/2017  Falls in the past year? No No No No No  Risk for fall due to : Impaired vision;Impaired balance/gait;Other (Comment) - - - -  Risk for fall due to: Comment wears eyeglasses, cataracts; back pain - - - -   FALL RISK PREVENTION PERTAINING TO HOME: Is your home free of loose throw rugs in walkways, pet beds, electrical cords, etc? Yes  Is there adequate lighting in your home to reduce risk of falls? Yes  Are there stairs in or around your home WITH handrails? Yes   ASSISTIVE DEVICES UTILIZED TO PREVENT FALLS: Do you have a life alert? No  Use of a cane, walker or w/c? No  Grab bars in the bathroom? Yes  Shower chair or a place to sit while bathing? Yes  An elevated toilet seat or a handicapped toilet? Yes   Timed Get Up and Go Performed: Yes. Pt ambulated 10 feet within 28 sec. Gait slow, steady and without the use of an assistive device. Fall risk prevention has been discussed. No intervention required at this time.  DME Order and Community Resource Referral:  Does the patient want an order for a shower chair or an elevated toilet seat?  N/A Does the patient want a Data processing manager Referral sent to the Care Guide for a life alert or installation of grab bars in the shower?  No   Depression Screen PHQ 2/9 Scores 01/03/2018 12/31/2017 10/31/2017 10/31/2017  PHQ - 2 Score 0 0 0 0  PHQ- 9 Score 0 - -  -     Cognitive Function     6CIT Screen 01/03/2018  What Year? 0 points  What month? 0 points  What time? 0 points  Count back from 20 0 points  Months in reverse 0 points  Repeat phrase 0 points  Total Score 0    Immunization History  Administered Date(s) Administered  . Influenza, High Dose Seasonal PF 01/20/2016, 02/14/2017, 12/31/2017  . Pneumococcal Conjugate-13 01/20/2016  . Pneumococcal Polysaccharide-23 02/21/2010  . Tdap 05/24/2010  . Zoster 11/25/2015    Qualifies for Shingles Vaccine? Yes  Zostavax completed 11/25/15. Due for Shingrix. Education has been provided regarding the importance of this vaccine. Pt has been advised to call insurance company to determine out of pocket expense. Advised may also receive vaccine at local pharmacy or Health Dept. Verbalized acceptance and understanding.  Screening Tests Health Maintenance  Topic Date Due  . COLON CANCER SCREENING ANNUAL FOBT  02/21/2018  . MAMMOGRAM  03/22/2018  . Fecal DNA (Cologuard)  10/27/2019  . TETANUS/TDAP  05/24/2020  . INFLUENZA VACCINE  Completed  . DEXA SCAN  Completed  . Hepatitis C Screening  Completed  . PNA vac Low Risk Adult  Completed    Cancer Screenings: Colorectal Screening: Completed Cologuard 10/26/16. Repeat every 3 years Mammogram: Completed 03/22/17. Repeat every year. Ordered today. Pt provided with contact info and advised to call to schedule appt.  Bone Density: Completed 02/22/16. Results reflect OSTEOPENIA. Repeat every 2 years. Ordered today. Pt provided with contact info and advised to call to schedule appt.  Lung Cancer Screening: (Low Dose CT Chest recommended if Age 44-80 years, 30 pack-year currently smoking OR have quit w/in 15years.) Does not qualify.   Additional Screening: Hepatitis C Screening: Completed 05/17/17  Vision and Dental Exams: Vision Screening: Recommended annual ophthalmology exams for early detection of glaucoma and other disorders of the eye. Is the  patient up to  date with their annual eye exam?  Yes  Who is the provider or what is the name of the office in which the pt attends annual screenings? Dr. George Ina  Recommended annual dental exams for proper oral hygiene    Plan:  I have personally reviewed and addressed the Medicare Annual Wellness questionnaire and have noted the following in the patient's chart:  A. Medical and social history B. Use of alcohol, tobacco or illicit drugs  C. Current medications and supplements D. Functional ability and status E.  Nutritional status F.  Physical activity G. Advance directives H. List of other physicians I.  Hospitalizations, surgeries, and ER visits in previous 12 months J.  [place] such as hearing and vision if needed, cognitive and depression L. Referrals and appointments  In addition, I have reviewed and discussed with patient certain preventive protocols, quality metrics, and best practice recommendations. A written personalized care plan for preventive services as well as general preventive health recommendations were provided to patient.  See attached scanned questionnaire for additional information.   Signed,  Aleatha Borer, LPN Nurse Health Advisor

## 2018-01-03 NOTE — Patient Instructions (Signed)
Ms. Rebecca Anthony , Thank you for taking time to come for your Medicare Wellness Visit. I appreciate your ongoing commitment to your health goals. Please review the following plan we discussed and let me know if I can assist you in the future.   Screening recommendations/referrals: Colorectal Screening: Up to date Mammogram: Ordered today. Please call to schedule your appointment Bone Density: Ordered today. Please call to schedule your appointment  Vision and Dental Exams: Recommended annual ophthalmology exams for early detection of glaucoma and other disorders of the eye Recommended annual dental exams for proper oral hygiene  Vaccinations: Influenza vaccine: Up to date Pneumococcal vaccine: Up to date Tdap vaccine: Up to date Shingles vaccine: Please call your insurance company to determine your out of pocket expense for the Shingrix vaccine. You may receive this vaccine at your local pharmacy.  Advanced directives: Please bring a copy of your POA (Power of Attorney) and/or Living Will to your next appointment.  Goals: Recommend to begin DASH diet as directed below  Next appointment: Please schedule your Annual Wellness Visit with your Nurse Health Advisor in one year.  Preventive Care 75 Years and Older, Female Preventive care refers to lifestyle choices and visits with your health care provider that can promote health and wellness. What does preventive care include?  A yearly physical exam. This is also called an annual well check.  Dental exams once or twice a year.  Routine eye exams. Ask your health care provider how often you should have your eyes checked.  Personal lifestyle choices, including:  Daily care of your teeth and gums.  Regular physical activity.  Eating a healthy diet.  Avoiding tobacco and drug use.  Limiting alcohol use.  Practicing safe sex.  Taking low-dose aspirin every day.  Taking vitamin and mineral supplements as recommended by your health  care provider. What happens during an annual well check? The services and screenings done by your health care provider during your annual well check will depend on your age, overall health, lifestyle risk factors, and family history of disease. Counseling  Your health care provider may ask you questions about your:  Alcohol use.  Tobacco use.  Drug use.  Emotional well-being.  Home and relationship well-being.  Sexual activity.  Eating habits.  History of falls.  Memory and ability to understand (cognition).  Work and work Statistician.  Reproductive health. Screening  You may have the following tests or measurements:  Height, weight, and BMI.  Blood pressure.  Lipid and cholesterol levels. These may be checked every 5 years, or more frequently if you are over 62 years old.  Skin check.  Lung cancer screening. You may have this screening every year starting at age 46 if you have a 30-pack-year history of smoking and currently smoke or have quit within the past 15 years.  Fecal occult blood test (FOBT) of the stool. You may have this test every year starting at age 58.  Flexible sigmoidoscopy or colonoscopy. You may have a sigmoidoscopy every 5 years or a colonoscopy every 10 years starting at age 43.  Hepatitis C blood test.  Hepatitis B blood test.  Sexually transmitted disease (STD) testing.  Diabetes screening. This is done by checking your blood sugar (glucose) after you have not eaten for a while (fasting). You may have this done every 1-3 years.  Bone density scan. This is done to screen for osteoporosis. You may have this done starting at age 93.  Mammogram. This may be done every 1-2  years. Talk to your health care provider about how often you should have regular mammograms. Talk with your health care provider about your test results, treatment options, and if necessary, the need for more tests. Vaccines  Your health care provider may recommend certain  vaccines, such as:  Influenza vaccine. This is recommended every year.  Tetanus, diphtheria, and acellular pertussis (Tdap, Td) vaccine. You may need a Td booster every 10 years.  Zoster vaccine. You may need this after age 92.  Pneumococcal 13-valent conjugate (PCV13) vaccine. One dose is recommended after age 45.  Pneumococcal polysaccharide (PPSV23) vaccine. One dose is recommended after age 60. Talk to your health care provider about which screenings and vaccines you need and how often you need them. This information is not intended to replace advice given to you by your health care provider. Make sure you discuss any questions you have with your health care provider. Document Released: 04/30/2015 Document Revised: 12/22/2015 Document Reviewed: 02/02/2015 Elsevier Interactive Patient Education  2017 [place] Prevention in the Home Falls can cause injuries. They can happen to people of all ages. There are many things you can do to make your home safe and to help prevent falls. What can I do on the outside of my home?  Regularly fix the edges of walkways and driveways and fix any cracks.  Remove anything that might make you trip as you walk through a door, such as a raised step or threshold.  Trim any bushes or trees on the path to your home.  Use bright outdoor lighting.  Clear any walking paths of anything that might make someone trip, such as rocks or tools.  Regularly check to see if handrails are loose or broken. Make sure that both sides of any steps have handrails.  Any raised decks and porches should have guardrails on the edges.  Have any leaves, snow, or ice cleared regularly.  Use sand or salt on walking paths during winter.  Clean up any spills in your garage right away. This includes oil or grease spills. What can I do in the bathroom?  Use night lights.  Install grab bars by the toilet and in the tub and shower. Do not use towel bars as grab  bars.  Use non-skid mats or decals in the tub or shower.  If you need to sit down in the shower, use a plastic, non-slip stool.  Keep the floor dry. Clean up any water that spills on the floor as soon as it happens.  Remove soap buildup in the tub or shower regularly.  Attach bath mats securely with double-sided non-slip rug tape.  Do not have throw rugs and other things on the floor that can make you trip. What can I do in the bedroom?  Use night lights.  Make sure that you have a light by your bed that is easy to reach.  Do not use any sheets or blankets that are too big for your bed. They should not hang down onto the floor.  Have a firm chair that has side arms. You can use this for support while you get dressed.  Do not have throw rugs and other things on the floor that can make you trip. What can I do in the kitchen?  Clean up any spills right away.  Avoid walking on wet floors.  Keep items that you use a lot in easy-to-reach places.  If you need to reach something above you, use a strong step  stool that has a grab bar.  Keep electrical cords out of the way.  Do not use floor polish or wax that makes floors slippery. If you must use wax, use non-skid floor wax.  Do not have throw rugs and other things on the floor that can make you trip. What can I do with my stairs?  Do not leave any items on the stairs.  Make sure that there are handrails on both sides of the stairs and use them. Fix handrails that are broken or loose. Make sure that handrails are as long as the stairways.  Check any carpeting to make sure that it is firmly attached to the stairs. Fix any carpet that is loose or worn.  Avoid having throw rugs at the top or bottom of the stairs. If you do have throw rugs, attach them to the floor with carpet tape.  Make sure that you have a light switch at the top of the stairs and the bottom of the stairs. If you do not have them, ask someone to add them for  you. What else can I do to help prevent falls?  Wear shoes that:  Do not have high heels.  Have rubber bottoms.  Are comfortable and fit you well.  Are closed at the toe. Do not wear sandals.  If you use a stepladder:  Make sure that it is fully opened. Do not climb a closed stepladder.  Make sure that both sides of the stepladder are locked into place.  Ask someone to hold it for you, if possible.  Clearly mark and make sure that you can see:  Any grab bars or handrails.  First and last steps.  Where the edge of each step is.  Use tools that help you move around (mobility aids) if they are needed. These include:  Canes.  Walkers.  Scooters.  Crutches.  Turn on the lights when you go into a dark area. Replace any light bulbs as soon as they burn out.  Set up your furniture so you have a clear path. Avoid moving your furniture around.  If any of your floors are uneven, fix them.  If there are any pets around you, be aware of where they are.  Review your medicines with your doctor. Some medicines can make you feel dizzy. This can increase your chance of falling. Ask your doctor what other things that you can do to help prevent falls. This information is not intended to replace advice given to you by your health care provider. Make sure you discuss any questions you have with your health care provider. Document Released: 01/28/2009 Document Revised: 09/09/2015 Document Reviewed: 05/08/2014  Heart-Healthy Eating Plan Heart-healthy meal planning includes:  Limiting unhealthy fats.  Increasing healthy fats.  Making other small dietary changes.  You may need to talk with your doctor or a diet specialist (dietitian) to create an eating plan that is right for you. What types of fat should I choose?  Choose healthy fats. These include olive oil and canola oil, flaxseeds, walnuts, almonds, and seeds.  Eat more omega-3 fats. These include salmon, mackerel,  sardines, tuna, flaxseed oil, and ground flaxseeds. Try to eat fish at least twice each week.  Limit saturated fats. ? Saturated fats are often found in animal products, such as meats, butter, and cream. ? Plant sources of saturated fats include palm oil, palm kernel oil, and coconut oil.  Avoid foods with partially hydrogenated oils in them. These include stick margarine, some  tub margarines, cookies, crackers, and other baked goods. These contain trans fats. What general guidelines do I need to follow?  Check food labels carefully. Identify foods with trans fats or high amounts of saturated fat.  Fill one half of your plate with vegetables and green salads. Eat 4-5 servings of vegetables per day. A serving of vegetables is: ? 1 cup of raw leafy vegetables. ?  cup of raw or cooked cut-up vegetables. ?  cup of vegetable juice.  Fill one fourth of your plate with whole grains. Look for the word "whole" as the first word in the ingredient list.  Fill one fourth of your plate with lean protein foods.  Eat 4-5 servings of fruit per day. A serving of fruit is: ? One medium whole fruit. ?  cup of dried fruit. ?  cup of fresh, frozen, or canned fruit. ?  cup of 100% fruit juice.  Eat more foods that contain soluble fiber. These include apples, broccoli, carrots, beans, peas, and barley. Try to get 20-30 g of fiber per day.  Eat more home-cooked food. Eat less restaurant, buffet, and fast food.  Limit or avoid alcohol.  Limit foods high in starch and sugar.  Avoid fried foods.  Avoid frying your food. Try baking, boiling, grilling, or broiling it instead. You can also reduce fat by: ? Removing the skin from poultry. ? Removing all visible fats from meats. ? Skimming the fat off of stews, soups, and gravies before serving them. ? Steaming vegetables in water or broth.  Lose weight if you are overweight.  Eat 4-5 servings of nuts, legumes, and seeds per week: ? One serving of  dried beans or legumes equals  cup after being cooked. ? One serving of nuts equals 1 ounces. ? One serving of seeds equals  ounce or one tablespoon.  You may need to keep track of how much salt or sodium you eat. This is especially true if you have high blood pressure. Talk with your doctor or dietitian to get more information. What foods can I eat? Grains Breads, including [place], white, pita, wheat, raisin, rye, oatmeal, and [place]. Tortillas that are neither fried nor made with lard or trans fat. Low-fat rolls, including hotdog and hamburger buns and English muffins. Biscuits. Muffins. Waffles. Pancakes. Light popcorn. Whole-grain cereals. Flatbread. Melba toast. Pretzels. Breadsticks. Rusks. Low-fat snacks. Low-fat crackers, including oyster, saltine, matzo, graham, animal, and rye. Rice and pasta, including brown rice and pastas that are made with whole wheat. Vegetables All vegetables. Fruits All fruits, but limit coconut. Meats and Other Protein Sources Lean, well-trimmed beef, veal, pork, and lamb. Chicken and [place] without skin. All fish and shellfish. Wild duck, rabbit, pheasant, and venison. Egg whites or low-cholesterol egg substitutes. Dried beans, peas, lentils, and tofu. Seeds and most nuts. Dairy Low-fat or nonfat cheeses, including ricotta, string, and mozzarella. Skim or 1% milk that is liquid, powdered, or evaporated. Buttermilk that is made with low-fat milk. Nonfat or low-fat yogurt. Beverages Mineral water. Diet carbonated beverages. Sweets and Desserts Sherbets and fruit ices. Honey, jam, marmalade, jelly, and syrups. Meringues and gelatins. Pure sugar candy, such as hard candy, jelly beans, gumdrops, mints, marshmallows, and small amounts of dark chocolate. [employer] Eat all sweets and desserts in moderation. Fats and Oils Nonhydrogenated (trans-free) margarines. Vegetable oils, including soybean, sesame, sunflower, olive, peanut, safflower, corn, canola,  and cottonseed. Salad dressings or mayonnaise made with a vegetable oil. Limit added fats and oils that you use for  cooking, baking, salads, and as spreads. Other Cocoa powder. Coffee and tea. All seasonings and condiments. The items listed above may not be a complete list of recommended foods or beverages. Contact your dietitian for more options. What foods are not recommended? Grains Breads that are made with saturated or trans fats, oils, or whole milk. Croissants. Butter rolls. Cheese breads. Sweet rolls. Donuts. Buttered popcorn. Chow mein noodles. High-fat crackers, such as cheese or butter crackers. Meats and Other Protein Sources Fatty meats, such as hotdogs, short ribs, sausage, spareribs, bacon, rib eye roast or steak, and mutton. High-fat deli meats, such as salami and bologna. Caviar. Domestic duck and goose. Organ meats, such as kidney, liver, sweetbreads, and heart. Dairy Cream, sour cream, cream cheese, and creamed cottage cheese. Whole-milk cheeses, including blue (bleu), Monte[place] Jack, [place], [place], American, [place], Swiss, cheddar, [place], and [place]. Whole or 2% milk that is liquid, evaporated, or condensed. Whole buttermilk. Cream sauce or high-fat cheese sauce. Yogurt that is made from whole milk. Beverages Regular sodas and juice drinks with added sugar. Sweets and Desserts Frosting. Pudding. Cookies. Cakes other than angel food cake. Candy that has milk chocolate or white chocolate, hydrogenated fat, butter, coconut, or unknown ingredients. Buttered syrups. Full-fat ice cream or ice cream drinks. Fats and Oils Gravy that has suet, meat fat, or shortening. Cocoa butter, hydrogenated oils, palm oil, coconut oil, palm kernel oil. These can often be found in baked products, candy, fried foods, nondairy creamers, and whipped toppings. Solid fats and shortenings, including bacon fat, salt pork, lard, and butter. Nondairy cream substitutes, such as coffee creamers and sour cream  substitutes. Salad dressings that are made of unknown oils, cheese, or sour cream. The items listed above may not be a complete list of foods and beverages to avoid. Contact your dietitian for more information. This information is not intended to replace advice given to you by your health care provider. Make sure you discuss any questions you have with your health care provider. Document Released: 10/03/2011 Document Revised: 09/09/2015 Document Reviewed: 09/25/2013 Elsevier Interactive Patient Education  2018 [employer].  Chartered certified accountant Patient Education  [employer].

## 2018-01-06 NOTE — Progress Notes (Deleted)
Cardiology Office Note  Date:  01/06/2018   ID:  Rebecca Anthony, Rebecca Anthony 01/23/1946, MRN [id number]  PCP:  Arnetha Courser, MD   No chief complaint on file.   HPI:  Ms. Rebecca Anthony is a 72 year old woman with past medical history of Obesity Hypertension Atrial fibrillation not on anticoagulation CHA2DS2Vasc score of 75 (age, female sex, and hypertension).   Chronic pain Hypothyroidism Last seen by cardiology 2015 Referred by Dr. Sanda Klein for consultation of her atrial fibrillation   history of having atrial fibrillation years ago in the past and was treated with quinidine when she was living in [place].  She stopped taking it for some reason and was doing relatively well when seen in 2015   episode of rapid A. fib following her colonoscopy at [facility]. She relates a story that she was feeling a little bit woozy after the study, and drank a can of Coke shortly after that she went in this rapid rhythm. EKG demonstrated A. fib with RVR heart rate 109. She was treated with IV medications and then sent home to followup with her PCP. She was started on metoprolol for rate control, Converted to normal sinus rhythm on her own in 2015 Was on aspirin at the time  In 2015, having episodes 15 minutes up to several hours of atrial fibrillation Worse after drinking caffeine Exacerbated by stress and desserts  Had stress test at that time, echocardiogram, event monitor ordered Echocardiogram May 2015 Normal ejection fraction, mild MR and TR normal right ventricular systolic pressures -She did not have stress test -She did not wear a 30-day monitor ordered back in 2015  -CT scan abdomen pelvis January 2018       PMH:   has a past medical history of Dysuria, Heart murmur, Hematuria, unspecified, History of shingles, History of swelling of feet, Hypertension, Irritable bowel syndrome, Lichenification and lichen simplex chronicus, Localized superficial swelling, mass, or lump, Osteoarthrosis, unspecified  whether generalized or localized, unspecified site, Osteoporosis, unspecified, PAF (paroxysmal atrial fibrillation) ([place]), Sinus problem, and Synovial cyst, unspecified.  PSH:    Past Surgical History:  Procedure Laterality Date  . ANKLE SURGERY Left   . APPENDECTOMY    . AV FISTULA REPAIR    . FOOT SURGERY Left   . VAGINAL HYSTERECTOMY    . VESICOVAGINAL FISTULA CLOSURE W/ TAH      Current Outpatient Medications  Medication Sig Dispense Refill  . alendronate (FOSAMAX) 70 MG tablet TAKE 1 TABLET BY MOUTH ONCE A WEEK. TAKE WITH A FULL GLASS OF WATER ON AN EMPTY STOMACH 13 tablet 0  . aspirin 81 MG tablet Take 1 tablet (81 mg total) by mouth daily. Do NOT take with Xarelto (just one or the other) 30 tablet   . calcium carbonate (OSCAL) 1500 (600 Ca) MG TABS tablet Take 1,500 mg by mouth daily with breakfast.     . Horse Chestnut 300 MG CAPS Take 1 capsule by mouth daily.    Marland Kitchen levothyroxine (SYNTHROID, LEVOTHROID) 25 MCG tablet Take 1 tablet by mouth daily.  2  . metoprolol succinate (TOPROL-XL) 50 MG 24 hr tablet TAKE 1 TABLET BY MOUTH EVERY DAY 30 tablet 11  . polyethylene glycol powder (GLYCOLAX/MIRALAX) powder Take 17 g by mouth daily. Dissolve 1 capful in 8 oz of liquid daily PRN 3350 g 1  . vitamin C (ASCORBIC ACID) 500 MG tablet Take 500 mg by mouth daily.     No current facility-administered medications for this visit.  Allergies:   Anaprox [naproxen sodium]   Social History:  The patient  reports that she has never smoked. She has never used smokeless tobacco. She reports that she does not drink alcohol or use drugs.   Family History:   family history includes Diabetes in her mother; Healthy in her sister; Heart disease in her father; Heart failure in her mother; Hypertension in her father and mother; Thyroid disease in her sister.    Review of Systems: ROS   PHYSICAL EXAM: VS:  There were no vitals taken for this visit. , BMI There is no height or weight on file to  calculate BMI. GEN: Well nourished, well developed, in no acute distress HEENT: normal Neck: no JVD, carotid bruits, or masses Cardiac: RRR; no murmurs, rubs, or gallops,no edema  Respiratory:  clear to auscultation bilaterally, normal work of breathing GI: soft, nontender, nondistended, + BS MS: no deformity or atrophy Skin: warm and dry, no rash Neuro:  Strength and sensation are intact Psych: euthymic mood, full affect    Recent Labs: 10/31/2017: Magnesium 2.2 12/31/2017: ALT 15; BUN 21; Creat 0.87; Hemoglobin 14.4; Platelets 308; Potassium 4.7; Sodium 140; TSH 2.28    Lipid Panel Lab Results  Component Value Date   CHOL 218 (H) 12/31/2017   HDL 74 12/31/2017   LDLCALC 128 (H) 12/31/2017   TRIG 67 12/31/2017      Wt Readings from Last 3 Encounters:  01/03/18 218 lb 8 oz (99.1 kg)  12/31/17 219 lb 4.8 oz (99.5 kg)  11/28/17 215 lb (97.5 kg)       ASSESSMENT AND PLAN:  No diagnosis found.   Disposition:   F/U  6 months  No orders of the defined types were placed in this encounter.    Signed, Esmond Plants, M.D., Ph.D. 01/06/2018  [place], [place]

## 2018-01-08 ENCOUNTER — Encounter

## 2018-01-08 ENCOUNTER — Ambulatory Visit: Payer: Medicare Other | Admitting: Cardiovascular Disease

## 2018-01-15 ENCOUNTER — Ambulatory Visit
Admission: RE | Admit: 2018-01-15 | Discharge: 2018-01-15 | Disposition: A | Discharge status: Home / Self Care | Payer: Medicare Other | Source: Ambulatory Visit | Attending: Family Medicine | Admitting: Family Medicine

## 2018-01-15 DIAGNOSIS — E048 Other specified nontoxic goiter: Secondary | ICD-10-CM | POA: Diagnosis not present

## 2018-01-15 DIAGNOSIS — G8929 Other chronic pain: Secondary | ICD-10-CM

## 2018-01-15 DIAGNOSIS — M5414 Radiculopathy, thoracic region: Secondary | ICD-10-CM

## 2018-01-15 DIAGNOSIS — M4184 Other forms of scoliosis, thoracic region: Secondary | ICD-10-CM | POA: Diagnosis not present

## 2018-01-15 DIAGNOSIS — R1032 Left lower quadrant pain: Secondary | ICD-10-CM

## 2018-01-15 DIAGNOSIS — R3129 Other microscopic hematuria: Secondary | ICD-10-CM

## 2018-01-15 DIAGNOSIS — K5730 Diverticulosis of large intestine without perforation or abscess without bleeding: Secondary | ICD-10-CM

## 2018-01-15 DIAGNOSIS — M4804 Spinal stenosis, thoracic region: Secondary | ICD-10-CM | POA: Insufficient documentation

## 2018-01-15 DIAGNOSIS — M5124 Other intervertebral disc displacement, thoracic region: Secondary | ICD-10-CM | POA: Diagnosis not present

## 2018-01-15 MED ORDER — GADOBENATE DIMEGLUMINE 529 MG/ML IV SOLN
20.0000 mL | Freq: Once | INTRAVENOUS | Status: AC | PRN
  Administered 2018-01-15: 20 mL via INTRAVENOUS

## 2018-01-16 ENCOUNTER — Ambulatory Visit: Payer: Medicare Other | Admitting: Orthotics

## 2018-01-16 DIAGNOSIS — M76829 Posterior tibial tendinitis, unspecified leg: Secondary | ICD-10-CM

## 2018-01-16 DIAGNOSIS — M79676 Pain in unspecified toe(s): Secondary | ICD-10-CM

## 2018-01-16 DIAGNOSIS — B351 Tinea unguium: Secondary | ICD-10-CM

## 2018-01-16 DIAGNOSIS — Q828 Other specified congenital malformations of skin: Secondary | ICD-10-CM

## 2018-01-16 NOTE — Progress Notes (Signed)
[place] to size 11...offload 1st met with kwedge and poron dancers pad 1/3

## 2018-01-17 ENCOUNTER — Ambulatory Visit: Payer: Medicare Other | Admitting: Pain Medicine

## 2018-01-18 ENCOUNTER — Encounter: Payer: Self-pay | Admitting: Family Medicine

## 2018-01-18 ENCOUNTER — Ambulatory Visit: Payer: Medicare Other | Admitting: Family Medicine

## 2018-01-18 VITALS — BP 122/68 | HR 99 | Temp 98.3°F | Ht 69.0 in | Wt 218.3 lb

## 2018-01-18 DIAGNOSIS — M4804 Spinal stenosis, thoracic region: Secondary | ICD-10-CM | POA: Diagnosis not present

## 2018-01-18 DIAGNOSIS — M5124 Other intervertebral disc displacement, thoracic region: Secondary | ICD-10-CM | POA: Diagnosis not present

## 2018-01-18 DIAGNOSIS — K862 Cyst of pancreas: Secondary | ICD-10-CM

## 2018-01-18 DIAGNOSIS — K824 Cholesterolosis of gallbladder: Secondary | ICD-10-CM

## 2018-01-18 DIAGNOSIS — E041 Nontoxic single thyroid nodule: Secondary | ICD-10-CM

## 2018-01-18 NOTE — Patient Instructions (Addendum)
We'll get another ultrasound of your gallbladder in 6 months (early April 2020) We'll get another MRI of your pancreas in 24 months (early October 2021) We'll have you see the neurosurgeon for your complicated back issues Good posture, lifting If you develop loss of control of your bladder or bowels, call 911 and get to the ER

## 2018-01-18 NOTE — Progress Notes (Signed)
BP 122/68   Pulse 99   Temp 98.3 F (36.8 C)   Ht 5\' 9"  (1.753 m)   Wt 218 lb 4.8 oz (99 kg)   SpO2 98%   BMI 32.24 kg/m    Subjective:    [name], female    DOB: 1945/07/02, 72 y.o.   MRN: [id number]  HPI: Rebecca Anthony is a 72 y.o. female  Chief Complaint  Patient presents with  . Follow-up    HPI Here for review of reports She is here with her husband; the visit was spent reviewing in detail her MRI findings  Hemangioma, 9 mm hx of birth control pills 6 mm gallbladder polyp 5 mm pancreatic cyst   MRI abd January 15, 2018 FINDINGS: Lower chest: No acute findings.  Hepatobiliary: There is a small cyst within segment 7 measuring 4 mm. Hemangioma within the posterior aspect of segment 8 measures 9 mm. No suspicious enhancing liver abnormality. 6 mm filling defect within the gallbladder fundus is identified, image 50/12. Favor small polyp. No gallbladder wall thickening or pericholecystic fluid identified. No biliary ductal dilatation.  Pancreas: No pancreatic inflammation or main duct dilatation identified. Tiny cystic structure within the head of pancreas measures 5 mm, image 18/4. No enhancing pancreas lesion identified.  Spleen:  Within normal limits in size and appearance.  Adrenals/Urinary Tract:  The adrenal glands are normal.  No hydronephrosis or kidney mass identified.  Stomach/Bowel: The stomach appears non-distended. No abnormal dilatation of the visualized bowel loops within the abdomen.  Vascular/Lymphatic: No pathologically enlarged lymph nodes identified. No abdominal aortic aneurysm demonstrated.  Other:  No free fluid or fluid collections identified.  Musculoskeletal: Lumbar scoliosis and degenerative disc disease identified.  IMPRESSION: 1. No acute findings and no explanation for patient's abdominal pain. 2. Suspect small gallbladder polyp. 3. 5 mm cystic lesion within head of pancreas is  identified. Followup exam in 24 months with repeat contrast enhanced MRI of the abdomen is advised. This recommendation follows ACR consensus guidelines: Management of Incidental Pancreatic Cysts: A White Paper of the ACR Incidental Findings Committee. [place] 6294;76:546-503.   Electronically Signed   By: Kerby Moors M.D.   On: 01/16/2018 09:03 ------------------------------------------------  She sees Dr. Pryor Ochoa ENT and then to dr. Loanne Drilling for the thyroid mass Left sided abd pain  MRI thoracic spine January 15, 2018 FINDINGS:  Limited cervical spine imaging: Widespread cervical spine disc degeneration suspected. Evidence of advanced endplate degeneration at C6-C7. Mild degenerative cervical spinal stenosis suspected.  Thoracic spine segmentation:  Normal on the comparison radiographs.  Alignment: Stable since July with dextroconvex thoracic scoliosis but preserved thoracic kyphosis. Mild grade 1 anterolisthesis at T1-T2 and T2-T3. Subtle anterolisthesis at T3-T4 and T4-T5.  Vertebrae: There is degenerative appearing marrow edema in the left T9-T10 facets (series 19, image 12).  No other marrow edema or acute osseous abnormality in the thoracic spine.  Benign vertebral body hemangiomas, including at the T8 level. There is mild degenerative appearing anterior inferior endplate marrow edema at L1 (series 19, image 10).  Cord: Capacious thoracic spinal canal. Spinal cord signal is within normal limits at all visualized levels. The conus medullaris appears normal at L1.  Paraspinal and other soft tissues: Heterogeneous right thyroid mass or goiter measuring at least 4.2 centimeters (series 19, image 8) with rounded areas of T2 and STIR hyperintensity which may be cystic (series 21, image 1). No significant mass effect suspected at the thoracic inlet.  Negative  visible thoracic and upper abdominal viscera. Negative visualized posterior paraspinal  soft tissues.  Disc levels:  C7-T1: Possible mild anterolisthesis with mild to moderate facet hypertrophy greater on the right. Mild if any right C8 foraminal stenosis.  T1-T2: Mild anterolisthesis. Right side facet hypertrophy, moderate. Mild right T1 foraminal stenosis.  T2-T3: Mild anterolisthesis. Moderate right side facet hypertrophy. Mild to moderate right T2 foraminal stenosis.  T3-T4: Subtle anterolisthesis. Mild disc bulge. Moderate right side facet hypertrophy. Mild right T3 foraminal stenosis.  T4-T5: Subtle anterolisthesis. Mild to moderate right facet hypertrophy. Mild right T4 foraminal stenosis.  T5-T6: Small right paracentral disc herniation (series 20, image 17) but capacious spinal canal. No stenosis.  T6-T7: Mild disc bulging or small central disc protrusion. No stenosis.  T7-T8: Minimal disc bulge.  No stenosis.  T8-T9: Minimal to mild disc bulge.  No stenosis.  T9-T10: Severe left side facet hypertrophy. Degenerative facet joint fluid and marrow edema as stated above. See series 20, image 29 series 19, image 12. Severe left T9 foraminal stenosis.  T10-T11: Disc space loss with circumferential disc bulge. Mild facet hypertrophy. Mild bilateral T10 foraminal stenosis.  T11-T12: Moderate circumferential disc bulge eccentric to the left with a superimposed left foraminal disc extrusion (series 17, image 13 and series 20, image 35). Moderate facet hypertrophy greater on the left. No spinal stenosis. Severe left T11 foraminal stenosis. Mild to moderate contralateral right T11 foraminal stenosis.  T12-L1: Mild to moderate facet hypertrophy but no stenosis.  No definite upper lumbar spinal stenosis.  IMPRESSION: 1. Acute exacerbation of chronic severe facet joint arthritis at the T9-T10 level on the left. Underlying chronic dextroconvex thoracic scoliosis. Subsequent severe left T9 foraminal stenosis. 2. T11-T12 disc and posterior  element degeneration with bulky left foraminal disc herniation superimposed on facet hypertrophy. Severe left T11 foraminal stenosis, and mild to moderate right foraminal stenosis. 3. Capacious thoracic spinal canal with no spinal stenosis despite occasional disc degeneration elsewhere (T5-T6 series 20, image 17). 4. Widespread cervical spine disc and endplate degeneration suspected with probably mild cervical spinal stenosis. 5. Partially visible right thyroid goiter/nodule. This meets consensus criteria for characterization by dedicated Thyroid Ultrasound. This follows ACR consensus guidelines: Managing Incidental Thyroid Nodules Detected on Imaging: White Paper of the ACR Incidental Thyroid Findings Committee. J Am Coll Radiol 2015; 12:143-150.   Electronically Signed   By: Genevie Ann M.D.   On: 01/16/2018 08:07   Depression screen [facility] 2/9 01/18/2018 01/03/2018 12/31/2017 10/31/2017 10/31/2017  Decreased Interest 0 0 0 0 0  Down, Depressed, Hopeless 0 0 0 0 0  PHQ - 2 Score 0 0 0 0 0  Altered sleeping 0 0 - - -  Tired, decreased energy 1 0 - - -  Change in appetite 0 0 - - -  Feeling bad or failure about yourself  0 0 - - -  Trouble concentrating 0 0 - - -  Moving slowly or fidgety/restless 0 0 - - -  Suicidal thoughts 0 0 - - -  PHQ-9 Score 1 0 - - -  Difficult doing work/chores Not difficult at all Not difficult at all - - -   Fall Risk  01/18/2018 01/03/2018 12/31/2017 11/28/2017 10/31/2017  Falls in the past year? No No No No No  Risk for fall due to : - Impaired vision;Impaired balance/gait;Other (Comment) - - -  Risk for fall due to: Comment - wears eyeglasses, cataracts; back pain - - -    Relevant past medical, surgical, family and  social history reviewed Past Medical History:  Diagnosis Date  . Dysuria   . Heart murmur   . Hematuria, unspecified   . History of shingles   . History of swelling of feet   . Hypertension   . Irritable bowel syndrome   .  Lichenification and lichen simplex chronicus   . Localized superficial swelling, mass, or lump   . Osteoarthrosis, unspecified whether generalized or localized, unspecified site   . Osteoporosis, unspecified   . PAF (paroxysmal atrial fibrillation) ([place])    Diagnosed years ago -- originally on Quinidine  . Sinus problem   . Synovial cyst, unspecified    Past Surgical History:  Procedure Laterality Date  . ANKLE SURGERY Left   . APPENDECTOMY    . AV FISTULA REPAIR    . FOOT SURGERY Left   . VAGINAL HYSTERECTOMY    . VESICOVAGINAL FISTULA CLOSURE W/ TAH     Family History  Problem Relation Age of Onset  . Diabetes Mother   . Hypertension Mother   . Heart failure Mother   . Heart disease Father   . Hypertension Father   . Thyroid disease Sister   . Healthy Sister   . Breast cancer Neg Hx    Social History   Tobacco Use  . Smoking status: Never Smoker  . Smokeless tobacco: Never Used  . Tobacco comment: smoking cessation materials not required  Substance Use Topics  . Alcohol use: No  . Drug use: No     [facility] Visit from [facility]/18/2018 in [facility]  AUDIT-C Score  0      Interim medical history since last visit reviewed. Allergies and medications reviewed  Review of Systems Per HPI unless specifically indicated above     Objective:    BP 122/68   Pulse 99   Temp 98.3 F (36.8 C)   Ht 5\' 9"  (1.753 m)   Wt 218 lb 4.8 oz (99 kg)   SpO2 98%   BMI 32.24 kg/m   Wt Readings from Last 3 Encounters:  01/18/18 218 lb 4.8 oz (99 kg)  01/03/18 218 lb 8 oz (99.1 kg)  12/31/17 219 lb 4.8 oz (99.5 kg)    Physical Exam  Constitutional: She appears well-developed and well-nourished. No distress.  Eyes: EOM are normal. No scleral icterus.  Neck: No thyromegaly present.  Cardiovascular: Normal rate.  Pulmonary/Chest: Effort normal.  Abdominal: She exhibits no distension.  Skin: No pallor.  Psychiatric: She has a normal mood and affect. Her behavior  is normal. Judgment and thought content normal.    Results for orders placed or performed in visit on 12/31/17  TSH  Result Value Ref Range   TSH 2.28 0.40 - 4.50 mIU/L  CBC with Differential/Platelet  Result Value Ref Range   WBC 5.7 3.8 - 10.8 Thousand/uL   RBC 5.18 (H) 3.80 - 5.10 Million/uL   Hemoglobin 14.4 11.7 - 15.5 g/dL   HCT 44.0 35.0 - 45.0 %   MCV 84.9 80.0 - 100.0 fL   MCH 27.8 27.0 - 33.0 pg   MCHC 32.7 32.0 - 36.0 g/dL   RDW 12.4 11.0 - 15.0 %   Platelets 308 140 - 400 Thousand/uL   MPV 9.4 7.5 - 12.5 fL   Neutro Abs 3,335 1,500 - 7,800 cells/uL   Lymphs Abs 1,590 850 - 3,900 cells/uL   WBC mixed population 638 200 - 950 cells/uL   Eosinophils Absolute 108 15 - 500 cells/uL   Basophils Absolute  29 0 - 200 cells/uL   Neutrophils Relative % 58.5 %   Total Lymphocyte 27.9 %   Monocytes Relative 11.2 %   Eosinophils Relative 1.9 %   Basophils Relative 0.5 %  COMPLETE METABOLIC PANEL WITH GFR  Result Value Ref Range   Glucose, Bld 88 65 - 99 mg/dL   BUN 21 7 - 25 mg/dL   Creat 0.87 0.60 - 0.93 mg/dL   GFR, Est Non African American 66 > OR = 60 mL/min/1.50m2   GFR, Est African American 77 > OR = 60 mL/min/1.3m2   BUN/Creatinine Ratio NOT APPLICABLE 6 - 22 (calc)   Sodium 140 135 - 146 mmol/L   Potassium 4.7 3.5 - 5.3 mmol/L   Chloride 104 98 - 110 mmol/L   CO2 29 20 - 32 mmol/L   Calcium 9.9 8.6 - 10.4 mg/dL   Total Protein 6.9 6.1 - 8.1 g/dL   Albumin 4.6 3.6 - 5.1 g/dL   Globulin 2.3 1.9 - 3.7 g/dL (calc)   AG Ratio 2.0 1.0 - 2.5 (calc)   Total Bilirubin 0.7 0.2 - 1.2 mg/dL   Alkaline phosphatase (APISO) 66 33 - 130 U/L   AST 18 10 - 35 U/L   ALT 15 6 - 29 U/L  Lipid panel  Result Value Ref Range   Cholesterol 218 (H) <200 mg/dL   HDL 74 >50 mg/dL   Triglycerides 67 <150 mg/dL   LDL Cholesterol (Calc) 128 (H) mg/dL (calc)   Total CHOL/HDL Ratio 2.9 <5.0 (calc)   Non-HDL Cholesterol (Calc) 144 (H) <130 mg/dL (calc)      Assessment & Plan:    Problem List Items Addressed This Visit      Digestive   Gallbladder polyp - Primary    Will get US of the gallbladder in 6 months; ordered today with expected date on order      Relevant Orders   US Abdomen Limited RUQ   Cyst of pancreas    Follow-up MRI due in two years per radiologist's recommendation; ordered today with expected date on order      Relevant Orders   MR Abdomen W Wo Contrast     Endocrine   Thyroid nodule    Managed by ENT        Musculoskeletal and Integument   Thoracic disc herniation    May explain her ongoing pain; refer to neurosurgeon      Relevant Orders   Ambulatory referral to Neurosurgery     Other   Foraminal stenosis of thoracic region   Relevant Orders   Ambulatory referral to Neurosurgery       Follow up plan: No follow-ups on file.  An after-visit summary was printed and given to the patient at [place].  Please see the patient instructions which may contain other information and recommendations beyond what is mentioned above in the assessment and plan.  No orders of the defined types were placed in this encounter.   Orders Placed This Encounter  Procedures  . US Abdomen Limited RUQ  . MR Abdomen W Wo Contrast  . Ambulatory referral to Neurosurgery

## 2018-01-22 ENCOUNTER — Telehealth: Payer: Self-pay

## 2018-01-22 NOTE — Telephone Encounter (Signed)
Please see the dates on the orders The gallbladder ultrasound is expected in April 2020 and the MRI (pancreas) is due in 2021

## 2018-01-22 NOTE — Telephone Encounter (Signed)
Ok will call and cancel appt they have already scheduled

## 2018-01-22 NOTE — Telephone Encounter (Signed)
Copied from [place] [phone]. Topic: General - Other >> Jan 21, 2018  3:20 PM Sheran Luz wrote: Reason for CRM: Pt is requesting call back from [facility] to discuss her upcoming ultrasound appointments-more specifically why she is having them done in detail. Please advise.

## 2018-01-22 NOTE — Telephone Encounter (Signed)
Pt called and was confused because she was already scheduled for gallbladder and MRI pancrease.  Just want to clarify she is not suppose to do those now correct?  Gallbladder in 6 months and MRI in 24 months?

## 2018-01-24 ENCOUNTER — Ambulatory Visit: Admission: RE | Admit: 2018-01-24 | Payer: Medicare Other | Source: Ambulatory Visit

## 2018-01-25 ENCOUNTER — Ambulatory Visit: Payer: Medicare Other

## 2018-01-25 ENCOUNTER — Telehealth: Payer: Self-pay | Admitting: Podiatry

## 2018-01-25 NOTE — Telephone Encounter (Signed)
Pt lvm for Rebecca Anthony to call her regarding the orthotics he was working on.  I returned call to pt to let her know that Liliane Channel is out of the office until Tuesday but I would give him a message to call her. She said she may cancel her appt with him on Wednesday until she is able to talk to him.Marland KitchenMarland Kitchen

## 2018-01-28 DIAGNOSIS — K824 Cholesterolosis of gallbladder: Secondary | ICD-10-CM | POA: Insufficient documentation

## 2018-01-28 DIAGNOSIS — K862 Cyst of pancreas: Secondary | ICD-10-CM | POA: Insufficient documentation

## 2018-01-28 DIAGNOSIS — M5124 Other intervertebral disc displacement, thoracic region: Secondary | ICD-10-CM

## 2018-01-28 DIAGNOSIS — E041 Nontoxic single thyroid nodule: Secondary | ICD-10-CM | POA: Insufficient documentation

## 2018-01-28 DIAGNOSIS — M4804 Spinal stenosis, thoracic region: Secondary | ICD-10-CM | POA: Insufficient documentation

## 2018-01-28 HISTORY — DX: Other intervertebral disc displacement, thoracic region: M51.24

## 2018-01-28 NOTE — Assessment & Plan Note (Signed)
Will get US of the gallbladder in 6 months; ordered today with expected date on order

## 2018-01-28 NOTE — Assessment & Plan Note (Signed)
Follow-up MRI due in two years per radiologist's recommendation; ordered today with expected date on order

## 2018-01-28 NOTE — Assessment & Plan Note (Signed)
May explain her ongoing pain; refer to neurosurgeon

## 2018-01-28 NOTE — Assessment & Plan Note (Signed)
Managed by ENT 

## 2018-01-30 ENCOUNTER — Other Ambulatory Visit: Payer: Medicare Other | Admitting: Orthotics

## 2018-01-31 ENCOUNTER — Telehealth: Payer: Self-pay

## 2018-01-31 NOTE — Telephone Encounter (Signed)
Please provide her with names of local endocrinologists; thank you

## 2018-01-31 NOTE — Telephone Encounter (Signed)
Copied from [place] [phone]. Topic: General - Other >> Jan 30, 2018  4:49 PM Yvette Rack wrote: Reason for CRM: Pt states she already sees a Musician but it is in [place]. Pt states she would like to know if Dr. Sanda Klein would provide the name of a provider in the [place] area. Pt states she does not need a referral she just would like a suggestion and she could call the provider to schedule herself as her insurance does not require a referral. Pt request suggestion of a provider closer to [place] area

## 2018-02-02 ENCOUNTER — Other Ambulatory Visit: Payer: Medicare Other

## 2018-02-05 ENCOUNTER — Telehealth: Payer: Self-pay | Admitting: Family Medicine

## 2018-02-05 NOTE — Telephone Encounter (Signed)
Copied from [place] [phone]. Topic: General - Inquiry >> Feb 05, 2018  1:51 PM Margot Ables wrote: Reason for CRM: question on the order for [place] AXILLA LEFT  Needing to know if the soft tissue mass has anything to do with the breast or if it is in the axilla or arm. If it is breast at all it may need to be ordered at the [facility]. Please advise so they are able to get pt scheduled.

## 2018-02-05 NOTE — Telephone Encounter (Signed)
Dr. Sanda Klein has already spoke with the breast center and they stated it needed to be scheduled through regular ultrasound.

## 2018-02-06 ENCOUNTER — Ambulatory Visit: Payer: Medicare Other

## 2018-02-15 ENCOUNTER — Ambulatory Visit: Payer: Medicare Other | Admitting: Family Medicine

## 2018-02-16 ENCOUNTER — Encounter: Payer: Self-pay | Admitting: Family Medicine

## 2018-02-22 ENCOUNTER — Ambulatory Visit: Payer: Medicare Other | Admitting: Endocrinology

## 2018-02-26 ENCOUNTER — Ambulatory Visit: Payer: Medicare Other | Admitting: Podiatry

## 2018-02-26 ENCOUNTER — Ambulatory Visit: Payer: Medicare Other | Admitting: Endocrinology

## 2018-02-26 ENCOUNTER — Other Ambulatory Visit: Payer: Self-pay | Admitting: Family Medicine

## 2018-02-26 DIAGNOSIS — M85852 Other specified disorders of bone density and structure, left thigh: Secondary | ICD-10-CM

## 2018-02-26 NOTE — Telephone Encounter (Signed)
DEXA scan scheduled for later this week I decided to not refill the bisphosphonate until I see those results in a few days

## 2018-02-27 ENCOUNTER — Other Ambulatory Visit: Payer: Self-pay | Admitting: Family Medicine

## 2018-02-27 DIAGNOSIS — M85852 Other specified disorders of bone density and structure, left thigh: Secondary | ICD-10-CM

## 2018-02-27 NOTE — Telephone Encounter (Signed)
Sent to [facility]

## 2018-02-27 NOTE — Telephone Encounter (Signed)
Copied from [place] [address] [phone]. Topic: Quick Communication - Rx Refill/Question >> Feb 27, 2018 12:17 PM Reyne Dumas L wrote: Medication: alendronate (FOSAMAX) 70 MG tablet  Has the patient contacted their pharmacy? Yes - states no refills left (Agent: If no, request that the patient contact the pharmacy for the refill.) (Agent: If yes, when and what did the pharmacy advise?)  Preferred Pharmacy (with phone number or street name): [id number] [address], [place] [phone] (Phone) [phone] (Fax)  Agent: Please be advised that RX refills may take up to 3 business days. We ask that you follow-up with your pharmacy.

## 2018-02-27 NOTE — Telephone Encounter (Signed)
Copied from [place] [phone]. Topic: Quick Communication - See Telephone Encounter >> Feb 27, 2018 12:15 PM Berneta Levins wrote: CRM for notification. See Telephone encounter for: 02/27/18.  Pt states that she hasn't heard anything from her note to Dr. Sanda Klein on [place] and she is calling to check in about that.  Pt states that the pain on her left side still hasn't gone away.

## 2018-02-28 ENCOUNTER — Other Ambulatory Visit: Payer: Medicare Other

## 2018-02-28 NOTE — Telephone Encounter (Signed)
I need to see her DEXA results from 2017 please Please let the patient know that I thought she had her bone density test scheduled for some time this week so I was waiting to get her results back before deciding about more therapy We do not want to use this medicine for very long, as using it too long can actually increase fracture risk I'd like to wait on her 2019 DEXA report to compare with 2017 and see if we will continue therapy Thank you

## 2018-03-01 ENCOUNTER — Ambulatory Visit: Payer: Medicare Other | Admitting: Family Medicine

## 2018-03-01 NOTE — Telephone Encounter (Signed)
I still don't see the actual DEXA report; I need the report that actually has the results with the T-score on it; if you see it, please show me; if not, please request it

## 2018-03-01 NOTE — Telephone Encounter (Signed)
Called radiology and they will fax over

## 2018-03-01 NOTE — Telephone Encounter (Signed)
Pt notified, dexa scheduled for dec 31 which is soonest.  2017 dexa in chart

## 2018-03-11 ENCOUNTER — Ambulatory Visit: Payer: Medicare Other | Admitting: Family Medicine

## 2018-03-12 ENCOUNTER — Ambulatory Visit: Payer: Medicare Other | Admitting: Podiatry

## 2018-03-12 ENCOUNTER — Encounter: Payer: Self-pay | Admitting: Podiatry

## 2018-03-12 DIAGNOSIS — M79676 Pain in unspecified toe(s): Secondary | ICD-10-CM | POA: Diagnosis not present

## 2018-03-12 DIAGNOSIS — Q828 Other specified congenital malformations of skin: Secondary | ICD-10-CM

## 2018-03-12 DIAGNOSIS — B351 Tinea unguium: Secondary | ICD-10-CM

## 2018-03-13 ENCOUNTER — Other Ambulatory Visit: Payer: Medicare Other | Admitting: Orthotics

## 2018-03-13 NOTE — Progress Notes (Signed)
She presents today chief complaint of painful elongated toenails and calluses bilaterally.  Objective: Vital signs are stable alert and oriented x3 pulses are strong and palpable bilateral.  Toenails are long thick yellow dystrophic clinically mycotic.  Reactive hyper keratomas and porokeratotic lesions plantar aspect of the forefoot left.  No open lesions or wounds are noted.  Assessment: Pain in limb secondary to onychomycosis and porokeratosis left.  Plan: Debridement of all reactive hyperkeratotic tissue debridement of toenails 1 through 5 bilateral secondary to pain.

## 2018-03-25 ENCOUNTER — Telehealth: Payer: Self-pay

## 2018-03-25 ENCOUNTER — Ambulatory Visit
Admission: RE | Admit: 2018-03-25 | Discharge: 2018-03-25 | Disposition: A | Discharge status: Home / Self Care | Payer: Medicare Other | Source: Ambulatory Visit | Attending: Family Medicine | Admitting: Family Medicine

## 2018-03-25 DIAGNOSIS — Z1239 Encounter for other screening for malignant neoplasm of breast: Secondary | ICD-10-CM

## 2018-03-25 DIAGNOSIS — Z1231 Encounter for screening mammogram for malignant neoplasm of breast: Secondary | ICD-10-CM | POA: Diagnosis present

## 2018-03-25 NOTE — Telephone Encounter (Signed)
Copied from [place] [phone]. Topic: General - Inquiry >> Mar 25, 2018  3:51 PM Vernona Rieger wrote: Reason for CRM: Patient is calling to see if Dr Sanda Klein has gotten her results yet from Dr Cari Caraway. She said she has an appointment tomorrow with Dr Sanda Klein and if she has not yet received those should she re-schedule? Please let patient know before appointment tomorrow. If Dr Sanda Klein does not have those yet, please contact Dr Cari Caraway for this. Thanks

## 2018-03-26 ENCOUNTER — Encounter: Payer: Self-pay | Admitting: Family Medicine

## 2018-03-26 ENCOUNTER — Ambulatory Visit: Payer: Medicare Other | Admitting: Family Medicine

## 2018-03-26 DIAGNOSIS — R1032 Left lower quadrant pain: Secondary | ICD-10-CM | POA: Diagnosis not present

## 2018-03-26 DIAGNOSIS — E669 Obesity, unspecified: Secondary | ICD-10-CM | POA: Diagnosis not present

## 2018-03-26 DIAGNOSIS — M85852 Other specified disorders of bone density and structure, left thigh: Secondary | ICD-10-CM | POA: Diagnosis not present

## 2018-03-26 DIAGNOSIS — K5900 Constipation, unspecified: Secondary | ICD-10-CM | POA: Diagnosis not present

## 2018-03-26 DIAGNOSIS — E66811 Obesity, class 1: Secondary | ICD-10-CM

## 2018-03-26 MED ORDER — POLYETHYLENE GLYCOL 3350 17 GM/SCOOP PO POWD: 17.0000 g | Freq: Every day | ORAL | 1 refills | Status: AC

## 2018-03-26 NOTE — Assessment & Plan Note (Signed)
We'll see if the DEXA scan has improved; would like to stay off of bisphosphonate because she thinks she has taken it for five years

## 2018-03-26 NOTE — Progress Notes (Signed)
BP 124/84   Pulse 98   Temp 97.9 F (36.6 C) (Oral)   Ht 5\' 9"  (1.753 m)   Wt 218 lb (98.9 kg)   SpO2 98%   BMI 32.19 kg/m    Subjective:    [name], female    DOB: 07-Aug-1945, 72 y.o.   MRN: [id number]  HPI: Rebecca Anthony is a 72 y.o. female  Chief Complaint  Patient presents with  . Follow-up    HPI She is here for f/u  She saw Dr. Cari Caraway on 02/07/18 She has gone for therapy, lots of things going on, but will go for 2nd visit soon He saw some issues but did not want to do surgery Dr. Dossie Arbour suggested celiac plexus block He says the nerves are connected but he was not ready to connect the back issues with the pain He offered pain management route / consideration, injections, or even NCS He also recommended PT He suggested GI referral to help find a source She is aware that pain management may help to locate the source if injection does or doesn't handle the pain She had a cousin who actually had something similar and had a celiac block She had a positive Rebecca Anthony's sign earlier; PT mashed on an area still hurting She saw Dr. Vicente Anthony in July 2018; she says it almost feels like something is moving in there; maybe spasms; strange feeling, like intermittent squeezing; that's been there before but now more often, more frequent She got the stool samples results back today and they were negative; FOBT negative Mar 18, 2018 She needs refill of the PEG; doing prune juice, trying to do that instead of just laxatives Stomach feels better if she stays regular; if she misses a day, she feels more bloated She will have her DEXA soon for her osteoporosis; she loves turnip greens and kale; more stir fry Weight is stuck High cholesterol; she is really trying to do some things on her own   Lab Results  Component Value Date   TSH 2.28 12/31/2017      Depression screen [facility] 2/9 03/26/2018 01/18/2018 01/03/2018 12/31/2017 10/31/2017  Decreased Interest 0 0 0 0 0    Down, Depressed, Hopeless 0 0 0 0 0  PHQ - 2 Score 0 0 0 0 0  Altered sleeping 0 0 0 - -  Tired, decreased energy 0 1 0 - -  Change in appetite 0 0 0 - -  Feeling bad or failure about yourself  0 0 0 - -  Trouble concentrating 0 0 0 - -  Moving slowly or fidgety/restless 0 0 0 - -  Suicidal thoughts 0 0 0 - -  PHQ-9 Score 0 1 0 - -  Difficult doing work/chores Not difficult at all Not difficult at all Not difficult at all - -   Fall Risk  03/26/2018 01/18/2018 01/03/2018 12/31/2017 11/28/2017  Falls in the past year? 0 No No No No  Risk for fall due to : - - Impaired vision;Impaired balance/gait;Other (Comment) - -  Risk for fall due to: Comment - - wears eyeglasses, cataracts; back pain - -    Relevant past medical, surgical, family and social history reviewed Past Medical History:  Diagnosis Date  . Dysuria   . Heart murmur   . Hematuria, unspecified   . History of shingles   . History of swelling of feet   . Hypertension   . Irritable bowel syndrome   . Lichenification  and lichen simplex chronicus   . Localized superficial swelling, mass, or lump   . Osteoarthrosis, unspecified whether generalized or localized, unspecified site   . Osteoporosis, unspecified   . PAF (paroxysmal atrial fibrillation) ([place])    Diagnosed years ago -- originally on Quinidine  . Sinus problem   . Synovial cyst, unspecified    Past Surgical History:  Procedure Laterality Date  . ANKLE SURGERY Left   . APPENDECTOMY    . AV FISTULA REPAIR    . FOOT SURGERY Left   . VAGINAL HYSTERECTOMY    . VESICOVAGINAL FISTULA CLOSURE W/ TAH     Family History  Problem Relation Age of Onset  . Diabetes Mother   . Hypertension Mother   . Heart failure Mother   . Heart disease Father   . Hypertension Father   . Thyroid disease Sister   . Healthy Sister   . Breast cancer Neg Hx    Social History   Tobacco Use  . Smoking status: Never Smoker  . Smokeless tobacco: Never Used  . Tobacco comment:  smoking cessation materials not required  Substance Use Topics  . Alcohol use: No  . Drug use: No     Office Visit from 03/26/2018 in [facility]  AUDIT-C Score  0      Interim medical history since last visit reviewed. Allergies and medications reviewed  Review of Systems Per HPI unless specifically indicated above     Objective:    BP 124/84   Pulse 98   Temp 97.9 F (36.6 C) (Oral)   Ht 5\' 9"  (1.753 m)   Wt 218 lb (98.9 kg)   SpO2 98%   BMI 32.19 kg/m   Wt Readings from Last 3 Encounters:  03/26/18 218 lb (98.9 kg)  01/18/18 218 lb 4.8 oz (99 kg)  01/03/18 218 lb 8 oz (99.1 kg)    Physical Exam Constitutional:      General: She is not in acute distress.    Appearance: She is well-developed. She is obese. She is not diaphoretic.  HENT:     Head: Normocephalic and atraumatic.  Eyes:     General: No scleral icterus. Neck:     Thyroid: No thyromegaly.  Cardiovascular:     Rate and Rhythm: Normal rate and regular rhythm.     Heart sounds: Normal heart sounds. No murmur.  Pulmonary:     Effort: Pulmonary effort is normal. No respiratory distress.     Breath sounds: Normal breath sounds. No wheezing.  Abdominal:     General: Bowel sounds are normal. There is no distension.     Palpations: Abdomen is soft.     Tenderness: There is abdominal tenderness (with positive Rebecca Anthony's sign).    Skin:    General: Skin is warm and dry.     Coloration: Skin is not pale.  Neurological:     Mental Status: She is alert.  Psychiatric:        Behavior: Behavior normal.        Thought Content: Thought content normal.        Judgment: Judgment normal.     Results for orders placed or performed in visit on 12/31/17  TSH  Result Value Ref Range   TSH 2.28 0.40 - 4.50 mIU/L  CBC with Differential/Platelet  Result Value Ref Range   WBC 5.7 3.8 - 10.8 Thousand/uL   RBC 5.18 (H) 3.80 - 5.10 Million/uL   Hemoglobin 14.4 11.7 - 15.5 g/dL  HCT 44.0 35.0  - 45.0 %   MCV 84.9 80.0 - 100.0 fL   MCH 27.8 27.0 - 33.0 pg   MCHC 32.7 32.0 - 36.0 g/dL   RDW 12.4 11.0 - 15.0 %   Platelets 308 140 - 400 Thousand/uL   MPV 9.4 7.5 - 12.5 fL   Neutro Abs 3,335 1,500 - 7,800 cells/uL   Lymphs Abs 1,590 850 - 3,900 cells/uL   WBC mixed population 638 200 - 950 cells/uL   Eosinophils Absolute 108 15 - 500 cells/uL   Basophils Absolute 29 0 - 200 cells/uL   Neutrophils Relative % 58.5 %   Total Lymphocyte 27.9 %   Monocytes Relative 11.2 %   Eosinophils Relative 1.9 %   Basophils Relative 0.5 %  COMPLETE METABOLIC PANEL WITH GFR  Result Value Ref Range   Glucose, Bld 88 65 - 99 mg/dL   BUN 21 7 - 25 mg/dL   Creat 0.87 0.60 - 0.93 mg/dL   GFR, Est Non African American 66 > OR = 60 mL/min/1.53m2   GFR, Est African American 77 > OR = 60 mL/min/1.65m2   BUN/Creatinine Ratio NOT APPLICABLE 6 - 22 (calc)   Sodium 140 135 - 146 mmol/L   Potassium 4.7 3.5 - 5.3 mmol/L   Chloride 104 98 - 110 mmol/L   CO2 29 20 - 32 mmol/L   Calcium 9.9 8.6 - 10.4 mg/dL   Total Protein 6.9 6.1 - 8.1 g/dL   Albumin 4.6 3.6 - 5.1 g/dL   Globulin 2.3 1.9 - 3.7 g/dL (calc)   AG Ratio 2.0 1.0 - 2.5 (calc)   Total Bilirubin 0.7 0.2 - 1.2 mg/dL   Alkaline phosphatase (APISO) 66 33 - 130 U/L   AST 18 10 - 35 U/L   ALT 15 6 - 29 U/L  Lipid panel  Result Value Ref Range   Cholesterol 218 (H) <200 mg/dL   HDL 74 >50 mg/dL   Triglycerides 67 <150 mg/dL   LDL Cholesterol (Calc) 128 (H) mg/dL (calc)   Total CHOL/HDL Ratio 2.9 <5.0 (calc)   Non-HDL Cholesterol (Calc) 144 (H) <130 mg/dL (calc)      Assessment & Plan:   Problem List Items Addressed This Visit      Musculoskeletal and Integument   Osteopenia of left femoral neck    We'll see if the DEXA scan has improved; would like to stay off of bisphosphonate because she thinks she has taken it for five years        Other   Obesity (BMI 30.0-34.9)    Discussed strategies for weight loss; gross estimate of  metabolic rate; need to decrease calories in and increase calories out net to result in weight loss; adequate hydration important      LLQ abdominal pain    She has had extensive work-up thus far; she is going to consider the nerve block with pain clinic; I can certain re-order pelvic imaging, but nerve entrapment, nerve impingement involving abdominal musculature is strong possibility; she will let me know how she wants to proceed       Other Visit Diagnoses    Constipation, unspecified constipation type       Relevant Medications   polyethylene glycol powder (GLYCOLAX/MIRALAX) powder       Follow up plan: Return in about 6 weeks (around 05/07/2018) for fasting labs only; Dr. Sanda Klein the next week.  An after-visit summary was printed and given to the patient at [facility].  Please see the  patient instructions which may contain other information and recommendations beyond what is mentioned above in the assessment and plan.  Meds ordered this encounter  Medications  . polyethylene glycol powder (GLYCOLAX/MIRALAX) powder    Sig: Take 17 g by mouth daily. Dissolve 1 capful in 8 oz of liquid daily PRN    Dispense:  3350 g    Refill:  1    No orders of the defined types were placed in this encounter. Homework: find printed out meal plan  Face-to-face time with patient was more than 25 minutes, >50% time spent counseling and coordination of care

## 2018-03-26 NOTE — Patient Instructions (Addendum)
Let me know how I can help, if I can make any referrals for you  Try to follow the DASH guidelines (DASH stands for Dietary Approaches to Stop Hypertension). Try to limit the sodium in your diet to no more than 1,500mg  of sodium per day. Certainly try to not exceed 2,000 mg per day at the very most. Do not add salt when cooking or at the table.  Check the sodium amount on labels when shopping, and choose items lower in sodium when given a choice. Avoid or limit foods that already contain a lot of sodium. Eat a diet rich in fruits and vegetables and whole grains, and try to lose weight if overweight or obese  Check out the information at familydoctor.org entitled "Nutrition for Weight Loss: What You Need to Know about Fad Diets" Try to lose between 1 pounds per week by taking in fewer calories and burning off more calories You can succeed by limiting portions, limiting foods dense in calories and fat, becoming more active, and drinking 8 glasses of water a day (64 ounces) Don't skip meals, especially breakfast, as skipping meals may alter your metabolism Do not use over-the-counter weight loss pills or gimmicks that claim rapid weight loss A healthy BMI (or body mass index) is between 18.5 and 24.9 You can calculate your ideal BMI at the [place] website ClubMonetize.fr  Consider using some sort of app or step or tracker to help you monitor calories in versus calories out  Let me know if I can refer you to nutritionist  Add a zero to the end of your weight for a really basic metabolic rate  Try to limit saturated fats in your diet (bologna, hot dogs, barbeque, cheeseburgers, hamburgers, steak, bacon, sausage, cheese, etc.) and get more fresh fruits, vegetables, and whole grains   Obesity, Adult Obesity is the condition of having too much total body fat. Being overweight or obese means that your weight is greater than what is considered healthy for  your body size. Obesity is determined by a measurement called BMI. BMI is an estimate of body fat and is calculated from height and weight. For adults, a BMI of 30 or higher is considered obese. Obesity can eventually lead to other health concerns and major illnesses, including:  Stroke.  Coronary artery disease (CAD).  Type 2 diabetes.  Some types of cancer, including cancers of the colon, breast, uterus, and gallbladder.  Osteoarthritis.  High blood pressure (hypertension).  High cholesterol.  Sleep apnea.  Gallbladder stones.  Infertility problems.  What are the causes? The main cause of obesity is taking in (consuming) more calories than your body uses for energy. Other factors that contribute to this condition may include:  Being born with genes that make you more likely to become obese.  Having a medical condition that causes obesity. These conditions include: ? Hypothyroidism. ? Polycystic ovarian syndrome (PCOS). ? Binge-eating disorder. ? Cushing syndrome.  Taking certain medicines, such as steroids, antidepressants, and seizure medicines.  Not being physically active (sedentary lifestyle).  Living where there are limited places to exercise safely or buy healthy foods.  Not getting enough sleep.  What increases the risk? The following factors may increase your risk of this condition:  Having a family history of obesity.  Being a woman of African-American descent.  Being a man of Hispanic descent.  What are the signs or symptoms? Having excessive body fat is the main symptom of this condition. How is this diagnosed? This condition may be  diagnosed based on:  Your symptoms.  Your medical history.  A physical exam. Your health care provider may measure: ? Your BMI. If you are an adult with a BMI between 25 and less than 30, you are considered overweight. If you are an adult with a BMI of 30 or higher, you are considered obese. ? The distances around  your hips and your waist (circumferences). These may be compared to each other to help diagnose your condition. ? Your skinfold thickness. Your health care provider may gently pinch a fold of your skin and measure it.  How is this treated? Treatment for this condition often includes changing your lifestyle. Treatment may include some or all of the following:  Dietary changes. Work with your health care provider and a dietitian to set a weight-loss goal that is healthy and reasonable for you. Dietary changes may include eating: ? Smaller portions. A portion size is the amount of a particular food that is healthy for you to eat at one time. This varies from person to person. ? Low-calorie or low-fat options. ? More whole grains, fruits, and vegetables.  Regular physical activity. This may include aerobic activity (cardio) and strength training.  Medicine to help you lose weight. Your health care provider may prescribe medicine if you are unable to lose 1 pound a week after 6 weeks of eating more healthily and doing more physical activity.  Surgery. Surgical options may include gastric banding and gastric bypass. Surgery may be done if: ? Other treatments have not helped to improve your condition. ? You have a BMI of 40 or higher. ? You have life-threatening health problems related to obesity.  Follow these instructions at home:  Eating and drinking   Follow recommendations from your health care provider about what you eat and drink. Your health care provider may advise you to: ? Limit fast foods, sweets, and processed snack foods. ? Choose low-fat options, such as low-fat milk instead of whole milk. ? Eat 5 or more servings of fruits or vegetables every day. ? Eat at home more often. This gives you more control over what you eat. ? Choose healthy foods when you eat out. ? Learn what a healthy portion size is. ? Keep low-fat snacks on hand. ? Avoid sugary drinks, such as soda, fruit  juice, iced tea sweetened with sugar, and flavored milk. ? Eat a healthy breakfast.  Drink enough water to keep your urine clear or pale yellow.  Do not go without eating for long periods of time (do not fast) or follow a fad diet. Fasting and fad diets can be unhealthy and even dangerous. Physical Activity  Exercise regularly, as told by your health care provider. Ask your health care provider what types of exercise are safe for you and how often you should exercise.  Warm up and stretch before being active.  Cool down and stretch after being active.  Rest between periods of activity. Lifestyle  Limit the time that you spend in front of your TV, computer, or video game system.  Find ways to reward yourself that do not involve food.  Limit alcohol intake to no more than 1 drink a day for nonpregnant women and 2 drinks a day for men. One drink equals 12 oz of beer, 5 oz of wine, or 1 oz of hard liquor. General instructions  Keep a weight loss journal to keep track of the food you eat and how much you exercise you get.  Take over-the-counter and  prescription medicines only as told by your health care provider.  Take vitamins and supplements only as told by your health care provider.  Consider joining a support group. Your health care provider may be able to recommend a support group.  Keep all follow-up visits as told by your health care provider. This is important. Contact a health care provider if:  You are unable to meet your weight loss goal after 6 weeks of dietary and lifestyle changes. This information is not intended to replace advice given to you by your health care provider. Make sure you discuss any questions you have with your health care provider. Document Released: 05/11/2004 Document Revised: 09/06/2015 Document Reviewed: 01/20/2015 Elsevier Interactive Patient Education  2018 [place].  Preventing Unhealthy [employer], Adult Staying at a healthy weight is  important. When fat builds up in your body, you may become overweight or obese. These conditions put you at greater risk for developing certain health problems, such as heart disease, diabetes, sleeping problems, joint problems, and some cancers. Unhealthy weight gain is often the result of making unhealthy choices in what you eat. It is also a result of not getting enough exercise. You can make changes to your lifestyle to prevent obesity and stay as healthy as possible. What nutrition changes can be made? To maintain a healthy weight and prevent obesity:  Eat only as much as your body needs. To do this: ? Pay attention to signs that you are hungry or full. Stop eating as soon as you feel full. ? If you feel hungry, try drinking water first. Drink enough water so your urine is clear or pale yellow. ? Eat smaller portions. ? Look at serving sizes on food labels. Most foods contain more than one serving per container. ? Eat the recommended amount of calories for your gender and activity level. While most active people should eat around 2,000 calories per day, if you are trying to lose weight or are not very active, you main need to eat less calories. Talk to your health care provider or dietitian about how many calories you should eat each day.  Choose healthy foods, such as: ? Fruits and vegetables. Try to fill at least half of your plate at each meal with fruits and vegetables. ? Whole grains, such as whole wheat bread, brown rice, and quinoa. ? Lean meats, such as chicken or fish. ? Other healthy proteins, such as beans, eggs, or tofu. ? Healthy fats, such as nuts, seeds, fatty fish, and olive oil. ? Low-fat or fat-free dairy.  Check food labels and avoid food and drinks that: ? Are high in calories. ? Have added sugar. ? Are high in sodium. ? Have saturated fats or trans fats.  Limit how much you eat of the following foods: ? Prepackaged meals. ? Fast food. ? Fried foods. ? Processed  meat, such as bacon, sausage, and deli meats. ? Fatty cuts of red meat and poultry with skin.  Cook foods in healthier ways, such as by baking, broiling, or grilling.  When grocery shopping, try to shop around the outside of the store. This helps you buy mostly fresh foods and avoid canned and prepackaged foods.  What lifestyle changes can be made?  Exercise at least 30 minutes 5 or more days each week. Exercising includes brisk walking, yard work, biking, running, swimming, and team sports like basketball and soccer. Ask your health care provider which exercises are safe for you.  Do not use any products that  contain nicotine or tobacco, such as cigarettes and e-cigarettes. If you need help quitting, ask your health care provider.  Limit alcohol intake to no more than 1 drink a day for nonpregnant women and 2 drinks a day for men. One drink equals 12 oz of beer, 5 oz of wine, or 1 oz of hard liquor.  Try to get 7-9 hours of sleep each night. What other changes can be made?  Keep a food and activity journal to keep track of: ? What you ate and how many calories you had. Remember to count sauces, dressings, and side dishes. ? Whether you were active, and what exercises you did. ? Your calorie, weight, and activity goals.  Check your weight regularly. Track any changes. If you notice you have gained weight, make changes to your diet or activity routine.  Avoid taking weight-loss medicines or supplements. Talk to your health care provider before starting any new medicine or supplement.  Talk to your health care provider before trying any new diet or exercise plan. Why are these changes important? Eating healthy, staying active, and having healthy habits not only help prevent obesity, they also:  Help you to manage stress and emotions.  Help you to connect with friends and family.  Improve your self-esteem.  Improve your sleep.  Prevent long-term health problems.  What can  happen if changes are not made? Being obese or overweight can cause you to develop joint or bone problems, which can make it hard for you to stay active or do activities you enjoy. Being obese or overweight also puts stress on your heart and lungs and can lead to health problems like diabetes, heart disease, and some cancers. Where to find more information: Talk with your health care provider or a dietitian about healthy eating and healthy lifestyle choices. You may also find other information through these resources:  U.S. Department of Agriculture MyPlate: FormerBoss.no  American Heart Association: www.heart.org  Centers for Disease Control and Prevention: http://www.wolf.info/  Summary  Staying at a healthy weight is important. It helps prevent certain diseases and health problems, such as heart disease, diabetes, joint problems, sleep disorders, and some cancers.  Being obese or overweight can cause you to develop joint or bone problems, which can make it hard for you to stay active or do activities you enjoy.  You can prevent unhealthy weight gain by eating a healthy diet, exercising regularly, not smoking, limiting alcohol, and getting enough sleep.  Talk with your health care provider or a dietitian for guidance about healthy eating and healthy lifestyle choices. This information is not intended to replace advice given to you by your health care provider. Make sure you discuss any questions you have with your health care provider. Document Released: 04/04/2016 Document Revised: 05/10/2016 Document Reviewed: 05/10/2016 Elsevier Interactive Patient Education  [employer].

## 2018-03-29 NOTE — Telephone Encounter (Signed)
Pt was already seen

## 2018-03-31 NOTE — Assessment & Plan Note (Signed)
She has had extensive work-up thus far; she is going to consider the nerve block with pain clinic; I can certain re-order pelvic imaging, but nerve entrapment, nerve impingement involving abdominal musculature is strong possibility; she will let me know how she wants to proceed

## 2018-03-31 NOTE — Assessment & Plan Note (Signed)
Discussed strategies for weight loss; gross estimate of metabolic rate; need to decrease calories in and increase calories out net to result in weight loss; adequate hydration important

## 2018-04-16 ENCOUNTER — Ambulatory Visit
Admission: RE | Admit: 2018-04-16 | Discharge: 2018-04-16 | Disposition: A | Discharge status: Home / Self Care | Payer: Medicare Other | Source: Ambulatory Visit | Attending: Family Medicine | Admitting: Family Medicine

## 2018-04-16 DIAGNOSIS — E2839 Other primary ovarian failure: Secondary | ICD-10-CM

## 2018-04-24 ENCOUNTER — Encounter: Payer: Self-pay | Admitting: Podiatry

## 2018-04-24 ENCOUNTER — Ambulatory Visit: Payer: Medicare Other | Admitting: Podiatry

## 2018-04-24 DIAGNOSIS — Q828 Other specified congenital malformations of skin: Secondary | ICD-10-CM | POA: Diagnosis not present

## 2018-04-24 DIAGNOSIS — M79676 Pain in unspecified toe(s): Secondary | ICD-10-CM

## 2018-04-24 DIAGNOSIS — B351 Tinea unguium: Secondary | ICD-10-CM

## 2018-04-24 NOTE — Progress Notes (Signed)
She presents today chief complaint of painful calluses and corns bilaterally.  Objective: Vital signs stable she alert oriented x3 toenails are long thick yellow dystrophic-like mycotic and painful.  Painful calluses and corns to the plantar aspect of bilateral foot.  Assessment: Pain in limb secondary to callosities plantar aspect of the forefoot as well as painful elongated toenails.  Plan: Debridement of toenails 1 through 5 bilateral.  Debridement of all reactive hyperkeratotic tissue.  Follow-up with her in a couple months.

## 2018-04-25 MED ORDER — NAFTIFINE HCL 2 % EX CREA: 1.0000 [drp] | TOPICAL_CREAM | CUTANEOUS | 2 refills | Status: DC

## 2018-04-25 NOTE — Addendum Note (Signed)
Addended by: Clovis Riley E on: 04/25/2018 10:28 AM   Modules accepted: Orders

## 2018-05-09 ENCOUNTER — Other Ambulatory Visit: Payer: Self-pay | Admitting: Family Medicine

## 2018-05-09 DIAGNOSIS — I499 Cardiac arrhythmia, unspecified: Secondary | ICD-10-CM

## 2018-05-14 ENCOUNTER — Ambulatory Visit: Payer: Medicare Other | Admitting: Family Medicine

## 2018-05-14 ENCOUNTER — Encounter: Payer: Self-pay | Admitting: Family Medicine

## 2018-05-14 VITALS — BP 126/80 | HR 80 | Temp 98.1°F | Resp 12 | Ht 69.0 in | Wt 221.4 lb

## 2018-05-14 DIAGNOSIS — Z5181 Encounter for therapeutic drug level monitoring: Secondary | ICD-10-CM | POA: Diagnosis not present

## 2018-05-14 DIAGNOSIS — K824 Cholesterolosis of gallbladder: Secondary | ICD-10-CM | POA: Diagnosis not present

## 2018-05-14 DIAGNOSIS — M85852 Other specified disorders of bone density and structure, left thigh: Secondary | ICD-10-CM

## 2018-05-14 DIAGNOSIS — E669 Obesity, unspecified: Secondary | ICD-10-CM

## 2018-05-14 DIAGNOSIS — K862 Cyst of pancreas: Secondary | ICD-10-CM

## 2018-05-14 DIAGNOSIS — I10 Essential (primary) hypertension: Secondary | ICD-10-CM

## 2018-05-14 DIAGNOSIS — R7303 Prediabetes: Secondary | ICD-10-CM | POA: Insufficient documentation

## 2018-05-14 DIAGNOSIS — E039 Hypothyroidism, unspecified: Secondary | ICD-10-CM

## 2018-05-14 DIAGNOSIS — I480 Paroxysmal atrial fibrillation: Secondary | ICD-10-CM

## 2018-05-14 DIAGNOSIS — E785 Hyperlipidemia, unspecified: Secondary | ICD-10-CM

## 2018-05-14 NOTE — Assessment & Plan Note (Signed)
Continue medicines; limit salt, working on weight loss

## 2018-05-14 NOTE — Assessment & Plan Note (Signed)
Encouragement given; may be seeing diabetic educator if A1c comes back >= 5.7

## 2018-05-14 NOTE — Assessment & Plan Note (Addendum)
Previously seen by cardiologist; in atrial fib today; due to see Dr. Rockey Situ soon

## 2018-05-14 NOTE — Progress Notes (Signed)
BP 126/80   Pulse 80   Temp 98.1 F (36.7 C) (Oral)   Resp 12   Ht 5\' 9"  (1.753 m)   Wt 221 lb 6.4 oz (100.4 kg)   SpO2 97%   BMI 32.70 kg/m    Subjective:    [name], female    DOB: [birth year], 73 y.o.   MRN: [id number]  HPI: Rebecca Anthony is a 73 y.o. female  Chief Complaint  Patient presents with  . Follow-up  . Flank Pain    left ongoing  . Medication Reaction    HPI Computers went down... late entry  Patient is here for follow-up  She has obesity and has been working on it; however, the holidays came along and birthdays and she gained some weight back; she wants to wait 2 weeks before doing any labs, wants to get back on track with her diet and weight loss; wants to make some changes; she wants to drink more water, less [place] Lite Tea; she says it only has 50 kcal a glass, but it adds up; she is trying to do some strengthening exercises; gait is still a little off from her ankle surgery; she weighed 217 pounds at home today; 221.4 pounds, with shoes; plans to weigh next time without her shoes; she is going to try to get more steps at home, marching in place, leg stretches, bridging exercises; elliptial, like a recumbent bike; 10 minutes on that at therapy; she will start low at home; she is also trying to cut out sweets; limits herself to 80 kcal of dark chocolate a day; she likes salted nuts, pistachios and walnuts  She has osteopenia; talked about calcium, milk; she has tried no fat milk the last few weeks; she like low fat cheese with Triscuits for snacks; southern green beans recipe with onion and garlic; loves broccoli  High cholesterol; she wants to come back for labs in a few weeks; had some dietary indiscretion over the holidays and wants to get back on track  Hypothyroidism Her energy level has been okay; last TSH was checked 12/2017  She still has the issue with her side; like a pulsating feeling; skin was twitching, pulsating; did  not hurt; she never did get the injections with pain clinic; she will consider them, howeve  Supposed to have cataract surgery  Gallbladder polyp, cyst on pancreas, spot on liver -- we were not able to get into the computer to look at her previous report because the computers were down during her visit  Prediabetes; previous A1c was 5.7 in 2017; no dry mouth; mother had diabetes;   She has paroxysmal atrial fib; on baby aspirin per cardiologist; she is due to have more testing, will be seeing Dr. Rockey Situ soon; she had an appt with him already but changed it  Depression screen [facility] 2/9 05/14/2018 03/26/2018 01/18/2018 01/03/2018 12/31/2017  Decreased Interest 0 0 0 0 0  Down, Depressed, Hopeless 0 0 0 0 0  PHQ - 2 Score 0 0 0 0 0  Altered sleeping 0 0 0 0 -  Tired, decreased energy 0 0 1 0 -  Change in appetite 0 0 0 0 -  Feeling bad or failure about yourself  0 0 0 0 -  Trouble concentrating 0 0 0 0 -  Moving slowly or fidgety/restless 0 0 0 0 -  Suicidal thoughts 0 0 0 0 -  PHQ-9 Score 0 0 1 0 -  Difficult  doing work/chores Not difficult at all Not difficult at all Not difficult at all Not difficult at all -   Fall Risk  05/14/2018 03/26/2018 01/18/2018 01/03/2018 12/31/2017  Falls in the past year? 0 0 No No No  Number falls in past yr: 0 - - - -  Injury with Fall? 0 - - - -  Risk for fall due to : - - - Impaired vision;Impaired balance/gait;Other (Comment) -  Risk for fall due to: Comment - - - wears eyeglasses, cataracts; back pain -    Relevant past medical, surgical, family and social history reviewed Past Medical History:  Diagnosis Date  . Dysuria   . Heart murmur   . Hematuria, unspecified   . History of shingles   . History of swelling of feet   . Hypertension   . Irritable bowel syndrome   . Lichenification and lichen simplex chronicus   . Localized superficial swelling, mass, or lump   . Osteoarthrosis, unspecified whether generalized or localized, unspecified site   .  Osteoporosis, unspecified   . PAF (paroxysmal atrial fibrillation) ([place])    Diagnosed years ago -- originally on Quinidine  . Sinus problem   . Synovial cyst, unspecified    Past Surgical History:  Procedure Laterality Date  . ANKLE SURGERY Left   . APPENDECTOMY    . AV FISTULA REPAIR    . FOOT SURGERY Left   . VAGINAL HYSTERECTOMY    . VESICOVAGINAL FISTULA CLOSURE W/ TAH     Family History  Problem Relation Age of Onset  . Diabetes Mother   . Hypertension Mother   . Heart failure Mother   . Heart disease Father   . Hypertension Father   . Thyroid disease Sister   . Healthy Sister   . Breast cancer Neg Hx    Social History   Tobacco Use  . Smoking status: Never Smoker  . Smokeless tobacco: Never Used  . Tobacco comment: smoking cessation materials not required  Substance Use Topics  . Alcohol use: No  . Drug use: No     Office Visit from 05/14/2018 in [facility]  AUDIT-C Score  0      Interim medical history since last visit reviewed. Allergies and medications reviewed  Review of Systems Per HPI unless specifically indicated above     Objective:    BP 126/80   Pulse 80   Temp 98.1 F (36.7 C) (Oral)   Resp 12   Ht 5\' 9"  (1.753 m)   Wt 221 lb 6.4 oz (100.4 kg)   SpO2 97%   BMI 32.70 kg/m   Wt Readings from Last 3 Encounters:  05/14/18 221 lb 6.4 oz (100.4 kg)  03/26/18 218 lb (98.9 kg)  01/18/18 218 lb 4.8 oz (99 kg)    Physical Exam  Results for orders placed or performed in visit on 12/31/17  TSH  Result Value Ref Range   TSH 2.28 0.40 - 4.50 mIU/L  CBC with Differential/Platelet  Result Value Ref Range   WBC 5.7 3.8 - 10.8 Thousand/uL   RBC 5.18 (H) 3.80 - 5.10 Million/uL   Hemoglobin 14.4 11.7 - 15.5 g/dL   HCT 44.0 35.0 - 45.0 %   MCV 84.9 80.0 - 100.0 fL   MCH 27.8 27.0 - 33.0 pg   MCHC 32.7 32.0 - 36.0 g/dL   RDW 12.4 11.0 - 15.0 %   Platelets 308 140 - 400 Thousand/uL   MPV 9.4 7.5 - 12.5  fL   Neutro Abs  3,335 1,500 - 7,800 cells/uL   Lymphs Abs 1,590 850 - 3,900 cells/uL   WBC mixed population 638 200 - 950 cells/uL   Eosinophils Absolute 108 15 - 500 cells/uL   Basophils Absolute 29 0 - 200 cells/uL   Neutrophils Relative % 58.5 %   Total Lymphocyte 27.9 %   Monocytes Relative 11.2 %   Eosinophils Relative 1.9 %   Basophils Relative 0.5 %  COMPLETE METABOLIC PANEL WITH GFR  Result Value Ref Range   Glucose, Bld 88 65 - 99 mg/dL   BUN 21 7 - 25 mg/dL   Creat 0.87 0.60 - 0.93 mg/dL   GFR, Est Non African American 66 > OR = 60 mL/min/1.67m2   GFR, Est African American 77 > OR = 60 mL/min/1.90m2   BUN/Creatinine Ratio NOT APPLICABLE 6 - 22 (calc)   Sodium 140 135 - 146 mmol/L   Potassium 4.7 3.5 - 5.3 mmol/L   Chloride 104 98 - 110 mmol/L   CO2 29 20 - 32 mmol/L   Calcium 9.9 8.6 - 10.4 mg/dL   Total Protein 6.9 6.1 - 8.1 g/dL   Albumin 4.6 3.6 - 5.1 g/dL   Globulin 2.3 1.9 - 3.7 g/dL (calc)   AG Ratio 2.0 1.0 - 2.5 (calc)   Total Bilirubin 0.7 0.2 - 1.2 mg/dL   Alkaline phosphatase (APISO) 66 33 - 130 U/L   AST 18 10 - 35 U/L   ALT 15 6 - 29 U/L  Lipid panel  Result Value Ref Range   Cholesterol 218 (H) <200 mg/dL   HDL 74 >50 mg/dL   Triglycerides 67 <150 mg/dL   LDL Cholesterol (Calc) 128 (H) mg/dL (calc)   Total CHOL/HDL Ratio 2.9 <5.0 (calc)   Non-HDL Cholesterol (Calc) 144 (H) <130 mg/dL (calc)      Assessment & Plan:   Problem List Items Addressed This Visit      Cardiovascular and Mediastinum   PAF (paroxysmal atrial fibrillation) ([facility]) (Chronic)    Previously seen by cardiologist; in atrial fib today; due to see Dr. Rockey Situ soon      Hypertension (Chronic)    Continue medicines; limit salt, working on weight loss        Digestive   Gallbladder polyp    Due for repeat imaging April 2020      Cyst of pancreas    Due for repeat imaging in October 2021        Endocrine   Hypothyroidism (Chronic)    Check TSH in a few weeks when she returns for  other labs        Musculoskeletal and Integument   Osteopenia of left femoral neck    Patient reports not taking fosamax; discussed calcium, 1200 mg daily divided into 2-3 servings; best in food, supplement only I fneeded        Other   Prediabetes    Check glucose and A1c in a few weeks; limit sugars, high-sugar fruits, white bread, white rice, white potatoes, pasta; working on weight loss, most important thing to prevent type 2 diabetes is to reach a normal BMI      Obesity (BMI 30.0-34.9)    Encouragement given; may be seeing diabetic educator if A1c comes back >= 5.7      Dyslipidemia    Recheck labs in a few weeks, she opted to defer and try to improve her diet first          Follow  up plan: Return in about 4 weeks (around 06/11/2018) for fasting labs only; visit with Dr. Sanda Klein several days later.  An after-visit summary was printed and given to the patient at [facility].  Please see the patient instructions which may contain other information and recommendations beyond what is mentioned above in the assessment and plan.  No orders of the defined types were placed in this encounter.   No orders of the defined types were placed in this encounter.

## 2018-05-14 NOTE — Patient Instructions (Addendum)
Check out the information at familydoctor.org entitled "Nutrition for Weight Loss: What You Need to Know about Fad Diets" Try to lose between 1-2 pounds per week by taking in fewer calories and burning off more calories You can succeed by limiting portions, limiting foods dense in calories and fat, becoming more active, and drinking 8 glasses of water a day (64 ounces) Don't skip meals, especially breakfast, as skipping meals may alter your metabolism Do not use over-the-counter weight loss pills or gimmicks that claim rapid weight loss A healthy BMI (or body mass index) is between 18.5 and 24.9 You can calculate your ideal BMI at the [place] website ClubMonetize.fr  Obesity, Adult Obesity is the condition of having too much total body fat. Being overweight or obese means that your weight is greater than what is considered healthy for your body size. Obesity is determined by a measurement called BMI. BMI is an estimate of body fat and is calculated from height and weight. For adults, a BMI of 30 or higher is considered obese. Obesity can eventually lead to other health concerns and major illnesses, including:  Stroke.  Coronary artery disease (CAD).  Type 2 diabetes.  Some types of cancer, including cancers of the colon, breast, uterus, and gallbladder.  Osteoarthritis.  High blood pressure (hypertension).  High cholesterol.  Sleep apnea.  Gallbladder stones.  Infertility problems. What are the causes? The main cause of obesity is taking in (consuming) more calories than your body uses for energy. Other factors that contribute to this condition may include:  Being born with genes that make you more likely to become obese.  Having a medical condition that causes obesity. These conditions include: ? Hypothyroidism. ? Polycystic ovarian syndrome (PCOS). ? Binge-eating disorder. ? Cushing syndrome.  Taking certain medicines, such  as steroids, antidepressants, and seizure medicines.  Not being physically active (sedentary lifestyle).  Living where there are limited places to exercise safely or buy healthy foods.  Not getting enough sleep. What increases the risk? The following factors may increase your risk of this condition:  Having a family history of obesity.  Being a woman of African-American descent.  Being a man of Hispanic descent. What are the signs or symptoms? Having excessive body fat is the main symptom of this condition. How is this diagnosed? This condition may be diagnosed based on:  Your symptoms.  Your medical history.  A physical exam. Your health care provider may measure: ? Your BMI. If you are an adult with a BMI between 25 and less than 30, you are considered overweight. If you are an adult with a BMI of 30 or higher, you are considered obese. ? The distances around your hips and your waist (circumferences). These may be compared to each other to help diagnose your condition. ? Your skinfold thickness. Your health care provider may gently pinch a fold of your skin and measure it. How is this treated? Treatment for this condition often includes changing your lifestyle. Treatment may include some or all of the following:  Dietary changes. Work with your health care provider and a dietitian to set a weight-loss goal that is healthy and reasonable for you. Dietary changes may include eating: ? Smaller portions. A portion size is the amount of a particular food that is healthy for you to eat at one time. This varies from person to person. ? Low-calorie or low-fat options. ? More whole grains, fruits, and vegetables.  Regular physical activity. This may include aerobic activity (cardio) and  strength training.  Medicine to help you lose weight. Your health care provider may prescribe medicine if you are unable to lose 1 pound a week after 6 weeks of eating more healthily and doing more  physical activity.  Surgery. Surgical options may include gastric banding and gastric bypass. Surgery may be done if: ? Other treatments have not helped to improve your condition. ? You have a BMI of 40 or higher. ? You have life-threatening health problems related to obesity. Follow these instructions at home:  Eating and drinking   Follow recommendations from your health care provider about what you eat and drink. Your health care provider may advise you to: ? Limit fast foods, sweets, and processed snack foods. ? Choose low-fat options, such as low-fat milk instead of whole milk. ? Eat 5 or more servings of fruits or vegetables every day. ? Eat at home more often. This gives you more control over what you eat. ? Choose healthy foods when you eat out. ? Learn what a healthy portion size is. ? Keep low-fat snacks on hand. ? Avoid sugary drinks, such as soda, fruit juice, iced tea sweetened with sugar, and flavored milk. ? Eat a healthy breakfast.  Drink enough water to keep your urine clear or pale yellow.  Do not go without eating for long periods of time (do not fast) or follow a fad diet. Fasting and fad diets can be unhealthy and even dangerous. Physical Activity  Exercise regularly, as told by your health care provider. Ask your health care provider what types of exercise are safe for you and how often you should exercise.  Warm up and stretch before being active.  Cool down and stretch after being active.  Rest between periods of activity. Lifestyle  Limit the time that you spend in front of your TV, computer, or video game system.  Find ways to reward yourself that do not involve food.  Limit alcohol intake to no more than 1 drink a day for nonpregnant women and 2 drinks a day for men. One drink equals 12 oz of beer, 5 oz of wine, or 1 oz of hard liquor. General instructions  Keep a weight loss journal to keep track of the food you eat and how much you exercise you  get.  Take over-the-counter and prescription medicines only as told by your health care provider.  Take vitamins and supplements only as told by your health care provider.  Consider joining a support group. Your health care provider may be able to recommend a support group.  Keep all follow-up visits as told by your health care provider. This is important. Contact a health care provider if:  You are unable to meet your weight loss goal after 6 weeks of dietary and lifestyle changes. This information is not intended to replace advice given to you by your health care provider. Make sure you discuss any questions you have with your health care provider. Document Released: 05/11/2004 Document Revised: 09/06/2015 Document Reviewed: 01/20/2015 Elsevier Interactive Patient Education  2019 [employer]  Preventing Unhealthy Weight Gain, Adult Staying at a healthy weight is important to your overall health. When fat builds up in your body, you may become overweight or obese. Being overweight or obese increases your risk of developing certain health problems, such as heart disease, diabetes, sleeping problems, joint problems, and some types of cancer. Unhealthy weight gain is often the result of making unhealthy food choices or not getting enough exercise. You can make changes   to your lifestyle to prevent obesity and stay as healthy as possible. What nutrition changes can be made?   Eat only as much as your body needs. To do this: ? Pay attention to signs that you are hungry or full. Stop eating as soon as you feel full. ? If you feel hungry, try drinking water first before eating. Drink enough water so your urine is clear or pale yellow. ? Eat smaller portions. Pay attention to portion sizes when eating out. ? Look at serving sizes on food labels. Most foods contain more than one serving per container. ? Eat the recommended number of calories for your gender and activity level. For most active  people, a daily total of 2,000 calories is appropriate. If you are trying to lose weight or are not very active, you may need to eat fewer calories. Talk with your health care provider or a diet and nutrition specialist (dietitian) about how many calories you need each day.  Choose healthy foods, such as: ? Fruits and vegetables. At each meal, try to fill at least half of your plate with fruits and vegetables. ? Whole grains, such as whole-wheat bread, brown rice, and quinoa. ? Lean meats, such as chicken or fish. ? Other healthy proteins, such as beans, eggs, or tofu. ? Healthy fats, such as nuts, seeds, fatty fish, and olive oil. ? Low-fat or fat-free dairy products.  Check food labels, and avoid food and drinks that: ? Are high in calories. ? Have added sugar. ? Are high in sodium. ? Have saturated fats or trans fats.  Cook foods in healthier ways, such as by baking, broiling, or grilling.  Make a meal plan for the week, and shop with a grocery list to help you stay on track with your purchases. Try to avoid going to the grocery store when you are hungry.  When grocery shopping, try to shop around the outside of the store first, where the fresh foods are. Doing this helps you to avoid prepackaged foods, which can be high in sugar, salt (sodium), and fat. What lifestyle changes can be made?   Exercise for 30 or more minutes on 5 or more days each week. Exercising may include brisk walking, yard work, biking, running, swimming, and team sports like basketball and soccer. Ask your health care provider which exercises are safe for you.  Do muscle-strengthening activities, such as lifting weights or using resistance bands, on 2 or more days a week.  Do not use any products that contain nicotine or tobacco, such as cigarettes and e-cigarettes. If you need help quitting, ask your health care provider.  Limit alcohol intake to no more than 1 drink a day for nonpregnant women and 2 drinks a  day for men. One drink equals 12 oz of beer, 5 oz of wine, or 1 oz of hard liquor.  Try to get 7-9 hours of sleep each night. What other changes can be made?  Keep a food and activity journal to keep track of: ? What you ate and how many calories you had. Remember to count the calories in sauces, dressings, and side dishes. ? Whether you were active, and what exercises you did. ? Your calorie, weight, and activity goals.  Check your weight regularly. Track any changes. If you notice you have gained weight, make changes to your diet or activity routine.  Avoid taking weight-loss medicines or supplements. Talk to your health care provider before starting any new medicine or supplement.  Talk   to your health care provider before trying any new diet or exercise plan. Why are these changes important? Eating healthy, staying active, and having healthy habits can help you to prevent obesity. Those changes also:  Help you manage stress and emotions.  Help you connect with friends and family.  Improve your self-esteem.  Improve your sleep.  Prevent long-term health problems. What can happen if changes are not made? Being obese or overweight can cause you to develop joint or bone problems, which can make it hard for you to stay active or do activities you enjoy. Being obese or overweight also puts stress on your heart and lungs and can lead to health problems like diabetes, heart disease, and some cancers. Where to find more information Talk with your health care provider or a dietitian about healthy eating and healthy lifestyle choices. You may also find information from:  [place] Department of Agriculture, MyPlate: FormerBoss.no  American Heart Association: www.heart.org  Centers for Disease Control and Prevention: http://www.wolf.info/ Summary  Staying at a healthy weight is important to your overall health. It helps you to prevent certain diseases and health problems, such as heart  disease, diabetes, joint problems, sleep disorders, and some types of cancer.  Being obese or overweight can cause you to develop joint or bone problems, which can make it hard for you to stay active or do activities you enjoy.  You can prevent unhealthy weight gain by eating a healthy diet, exercising regularly, not smoking, limiting alcohol, and getting enough sleep.  Talk with your health care provider or a dietitian for guidance about healthy eating and healthy lifestyle choices. This information is not intended to replace advice given to you by your health care provider. Make sure you discuss any questions you have with your health care provider. Document Released: 04/04/2016 Document Revised: 01/12/2017 Document Reviewed: 05/10/2016 Elsevier Interactive Patient Education  2019 Elsevier Inc.  Prediabetes Eating Plan Prediabetes is a condition that causes blood sugar (glucose) levels to be higher than normal. This increases the risk for developing diabetes. In order to prevent diabetes from developing, your health care provider may recommend a diet and other lifestyle changes to help you:  Control your blood glucose levels.  Improve your cholesterol levels.  Manage your blood pressure. Your health care provider may recommend working with a diet and nutrition specialist (dietitian) to make a meal plan that is best for you. What are tips for following this plan? Lifestyle  Set weight loss goals with the help of your health care team. It is recommended that most people with prediabetes lose 7% of their current body weight.  Exercise for at least 30 minutes at least 5 days a week.  Attend a support group or seek ongoing support from a mental health counselor.  Take over-the-counter and prescription medicines only as told by your health care provider. Reading food labels  Read food labels to check the amount of fat, salt (sodium), and sugar in prepackaged foods. Avoid foods that  have: ? Saturated fats. ? Trans fats. ? Added sugars.  Avoid foods that have more than 300 milligrams (mg) of sodium per serving. Limit your daily sodium intake to less than 2,300 mg each day. Shopping  Avoid buying pre-made and processed foods. Cooking  Cook with olive oil. Do not use butter, lard, or ghee.  Bake, broil, grill, or boil foods. Avoid frying. Meal planning   Work with your dietitian to develop an eating plan that is right for you. This may  include: ? Tracking how many calories you take in. Use a food diary, notebook, or mobile application to track what you eat at each meal. ? Using the glycemic index (GI) to plan your meals. The index tells you how quickly a food will raise your blood glucose. Choose low-GI foods. These foods take a longer time to raise blood glucose.  Consider following a Mediterranean diet. This diet includes: ? Several servings each day of fresh fruits and vegetables. ? Eating fish at least twice a week. ? Several servings each day of whole grains, beans, nuts, and seeds. ? Using olive oil instead of other fats. ? Moderate alcohol consumption. ? Eating small amounts of red meat and whole-fat dairy.  If you have high blood pressure, you may need to limit your sodium intake or follow a diet such as the DASH eating plan. DASH is an eating plan that aims to lower high blood pressure. What foods are recommended? The items listed below may not be a complete list. Talk with your dietitian about what dietary choices are best for you. Grains Whole grains, such as whole-wheat or whole-grain breads, crackers, cereals, and pasta. Unsweetened oatmeal. Bulgur. Barley. Quinoa. Brown rice. Corn or whole-wheat flour tortillas or taco shells. Vegetables Lettuce. Spinach. Peas. Beets. Cauliflower. Cabbage. Broccoli. Carrots. Tomatoes. Squash. Eggplant. Herbs. Peppers. Onions. Cucumbers. Brussels sprouts. Fruits Berries. Bananas. Apples. Oranges. Grapes. Papaya.  Mango. Pomegranate. Kiwi. Grapefruit. Cherries. Meats and other protein foods Seafood. Poultry without skin. Lean cuts of pork and beef. Tofu. Eggs. Nuts. Beans. Dairy Low-fat or fat-free dairy products, such as yogurt, cottage cheese, and cheese. Beverages Water. Tea. Coffee. Sugar-free or diet soda. Seltzer water. Lowfat or no-fat milk. Milk alternatives, such as soy or almond milk. Fats and oils Olive oil. Canola oil. Sunflower oil. Grapeseed oil. Avocado. Walnuts. Sweets and desserts Sugar-free or low-fat pudding. Sugar-free or low-fat ice cream and other frozen treats. Seasoning and other foods Herbs. Sodium-free spices. Mustard. Relish. Low-fat, low-sugar ketchup. Low-fat, low-sugar barbecue sauce. Low-fat or fat-free mayonnaise. What foods are not recommended? The items listed below may not be a complete list. Talk with your dietitian about what dietary choices are best for you. Grains Refined white flour and flour products, such as bread, pasta, snack foods, and cereals. Vegetables Canned vegetables. Frozen vegetables with butter or cream sauce. Fruits Fruits canned with syrup. Meats and other protein foods Fatty cuts of meat. Poultry with skin. Breaded or fried meat. Processed meats. Dairy Full-fat yogurt, cheese, or milk. Beverages Sweetened drinks, such as sweet iced tea and soda. Fats and oils Butter. Lard. Ghee. Sweets and desserts Baked goods, such as cake, cupcakes, pastries, cookies, and cheesecake. Seasoning and other foods Spice mixes with added salt. Ketchup. Barbecue sauce. Mayonnaise. Summary  To prevent diabetes from developing, you may need to make diet and other lifestyle changes to help control blood sugar, improve cholesterol levels, and manage your blood pressure.  Set weight loss goals with the help of your health care team. It is recommended that most people with prediabetes lose 7 percent of their current body weight.  Consider following a  Mediterranean diet that includes plenty of fresh fruits and vegetables, whole grains, beans, nuts, seeds, fish, lean meat, low-fat dairy, and healthy oils. This information is not intended to replace advice given to you by your health care provider. Make sure you discuss any questions you have with your health care provider. Document Released: 08/18/2014 Document Revised: 06/07/2016 Document Reviewed: 06/07/2016 Elsevier Interactive Patient Education  2019 [place].

## 2018-05-14 NOTE — Assessment & Plan Note (Addendum)
Check TSH in a few weeks when she returns for other labs

## 2018-05-14 NOTE — Assessment & Plan Note (Signed)
Check glucose and A1c in a few weeks; limit sugars, high-sugar fruits, white bread, white rice, white potatoes, pasta; working on weight loss, most important thing to prevent type 2 diabetes is to reach a normal BMI

## 2018-05-30 DIAGNOSIS — E785 Hyperlipidemia, unspecified: Secondary | ICD-10-CM | POA: Insufficient documentation

## 2018-05-30 NOTE — Assessment & Plan Note (Signed)
Due for repeat imaging April 2020

## 2018-05-30 NOTE — Assessment & Plan Note (Signed)
Recheck labs in a few weeks, she opted to defer and try to improve her diet first

## 2018-05-30 NOTE — Assessment & Plan Note (Signed)
Due for repeat imaging in October 2021

## 2018-05-30 NOTE — Assessment & Plan Note (Signed)
Patient reports not taking fosamax; discussed calcium, 1200 mg daily divided into 2-3 servings; best in food, supplement only I fneeded

## 2018-06-20 ENCOUNTER — Ambulatory Visit: Payer: Medicare Other | Admitting: Family Medicine

## 2018-06-26 ENCOUNTER — Ambulatory Visit: Payer: Medicare Other | Admitting: Podiatry

## 2018-07-04 ENCOUNTER — Other Ambulatory Visit: Payer: Self-pay | Admitting: Family Medicine

## 2018-07-04 DIAGNOSIS — I499 Cardiac arrhythmia, unspecified: Secondary | ICD-10-CM

## 2018-07-04 NOTE — Telephone Encounter (Signed)
Pt called to check the status of the refill request. Pt stated she still has some pills but she would like to go ahead and stock them so she will not have to continue to go out during this time.

## 2018-07-05 ENCOUNTER — Ambulatory Visit: Payer: Medicare Other | Admitting: Family Medicine

## 2018-07-05 NOTE — Addendum Note (Signed)
Addended by: Beata Beason, Satira Anis on: 07/05/2018 08:56 AM   Modules accepted: Orders

## 2018-07-05 NOTE — Telephone Encounter (Signed)
You can check the last prescription and let her know   Disp Refills Start End   metoprolol succinate (TOPROL-XL) 50 MG 24 hr tablet 90 tablet 1 07/04/2018    Sig: TAKE 1 TABLET BY MOUTH EVERY DAY   Sent to pharmacy as: metoprolol succinate (TOPROL-XL) 50 MG 24 hr tablet   E-Prescribing Status: Receipt confirmed by pharmacy (07/04/2018 1:07 PM EDT)    [id number] [address], [place]

## 2018-07-05 NOTE — Addendum Note (Signed)
Addended by: Cathrine Muster C on: 07/05/2018 08:30 AM   Modules accepted: Orders

## 2018-07-10 ENCOUNTER — Ambulatory Visit: Payer: Medicare Other | Admitting: Podiatry

## 2018-08-05 ENCOUNTER — Ambulatory Visit: Payer: Medicare Other | Admitting: Family Medicine

## 2018-08-08 ENCOUNTER — Ambulatory Visit: Payer: Medicare Other | Admitting: Family Medicine

## 2018-08-21 ENCOUNTER — Ambulatory Visit: Payer: Medicare Other | Admitting: Podiatry

## 2018-09-11 ENCOUNTER — Ambulatory Visit: Payer: Medicare Other | Admitting: Podiatry

## 2018-09-17 ENCOUNTER — Encounter: Payer: Self-pay | Admitting: Nurse Practitioner

## 2018-09-17 ENCOUNTER — Other Ambulatory Visit: Payer: Self-pay

## 2018-09-17 ENCOUNTER — Ambulatory Visit: Payer: Medicare Other | Admitting: Family Medicine

## 2018-09-17 ENCOUNTER — Ambulatory Visit: Payer: Medicare Other | Admitting: Nurse Practitioner

## 2018-09-17 VITALS — BP 126/70 | HR 93 | Temp 98.5°F | Resp 14 | Ht 69.0 in | Wt 213.2 lb

## 2018-09-17 DIAGNOSIS — I480 Paroxysmal atrial fibrillation: Secondary | ICD-10-CM | POA: Diagnosis not present

## 2018-09-17 DIAGNOSIS — R7303 Prediabetes: Secondary | ICD-10-CM

## 2018-09-17 DIAGNOSIS — E039 Hypothyroidism, unspecified: Secondary | ICD-10-CM | POA: Diagnosis not present

## 2018-09-17 DIAGNOSIS — I10 Essential (primary) hypertension: Secondary | ICD-10-CM | POA: Diagnosis not present

## 2018-09-17 DIAGNOSIS — M85852 Other specified disorders of bone density and structure, left thigh: Secondary | ICD-10-CM

## 2018-09-17 DIAGNOSIS — M436 Torticollis: Secondary | ICD-10-CM

## 2018-09-17 DIAGNOSIS — E785 Hyperlipidemia, unspecified: Secondary | ICD-10-CM

## 2018-09-17 MED ORDER — ALENDRONATE SODIUM 70 MG PO TABS: ORAL_TABLET | ORAL | 1 refills | Status: DC

## 2018-09-17 MED ORDER — METOPROLOL SUCCINATE ER 50 MG PO TB24: 50.0000 mg | ORAL_TABLET | Freq: Every day | ORAL | 1 refills | Status: DC

## 2018-09-17 NOTE — Progress Notes (Addendum)
Name: Rebecca Anthony   MRN: [id number]    DOB: Sep 16, 1945   Date:09/17/2018       Progress Note  Subjective  Chief Complaint  Chief Complaint  Patient presents with  . Follow-up  . Neck Pain    onset 1.5 weeks    HPI  Neck pain  Patient endorses some neck soreness and stiffness that started about 2 weeks ago. Using bengay and rubbing made it worse. States pain with neck extension but has been slowly improving- now she has full range of motions. Denies fever, chills, nausea, vomiting, vision changes, paresthesias, upper extremity weakness   Paroxysmal Atrial Fibrillation Takes metoprolol 50 mg daily, states occasionally gets palpitations but not very often. Takes 81mg  ASA daily.  Was referred to Dr. Rockey Situ, cardiology but due to pandemic she pushed appointment out. Plans to reschedule with them.  Denies shortness of breath, chest pain, dizziness.   Hypertension Takes metoprolol BP Readings from Last 3 Encounters:  09/17/18 126/70  05/14/18 126/80  03/26/18 124/84    Hypothyroidism Takes synthroid 52mcg, follows up with Dr. Loanne Drilling  Lab Results  Component Value Date   TSH 2.28 12/31/2017    Osteopenia Was taking fosamax weekly but was stopped by PCP last time but she is currently takingvitamin D and Calcium supplements  Dyslipidemia Patient wanted to make some diet adjustments before starting medications. Has been working on weight loss.   Lab Results  Component Value Date   CHOL 218 (H) 12/31/2017   HDL 74 12/31/2017   LDLCALC 128 (H) 12/31/2017   TRIG 67 12/31/2017   CHOLHDL 2.9 12/31/2017    Wt Readings from Last 3 Encounters:  09/17/18 213 lb 3.2 oz (96.7 kg)  05/14/18 221 lb 6.4 oz (100.4 kg)  03/26/18 218 lb (98.9 kg)    Chronic pain  Follows up with Dr. Dossie Arbour   PHQ2/9: Depression screen [facility] 2/9 09/17/2018 05/14/2018 03/26/2018 01/18/2018 01/03/2018  Decreased Interest 0 0 0 0 0  Down, Depressed, Hopeless 0 0 0 0 0  PHQ - 2 Score 0 0 0 0 0  Altered  sleeping 0 0 0 0 0  Tired, decreased energy 0 0 0 1 0  Change in appetite 0 0 0 0 0  Feeling bad or failure about yourself  0 0 0 0 0  Trouble concentrating 0 0 0 0 0  Moving slowly or fidgety/restless 0 0 0 0 0  Suicidal thoughts 0 0 0 0 0  PHQ-9 Score 0 0 0 1 0  Difficult doing work/chores Not difficult at all Not difficult at all Not difficult at all Not difficult at all Not difficult at all  Some recent data might be hidden     PHQ reviewed. Negative  Patient Active Problem List   Diagnosis Date Noted  . Dyslipidemia 05/30/2018  . Prediabetes 05/14/2018  . Thyroid nodule 01/28/2018  . Gallbladder polyp 01/28/2018  . Cyst of pancreas 01/28/2018  . 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 Foraminal stenosis of thoracic region 01/28/2018  . Idiopathic scoliosis of thoracolumbar spine 11/28/2017  . Sigmoid diverticulosis 11/28/2017  . Chronic ankle pain (Fourth Area of Pain) (Left) 11/07/2017  . Chronic abdominal pain (LLQ) (Primary Area of Pain) (Left) 10/31/2017  . Chronic low back pain (Secondary Area of Pain) (Bilateral) (L>R) 10/31/2017  . Chronic pain syndrome 10/31/2017  . Disorder of skeletal system 10/31/2017  . Chronic upper back pain [facility] Area of Pain) (Bilateral) (L>R) 10/31/2017  . Obesity (BMI 30.0-34.9) 09/13/2017  .  Hematuria, microscopic 07/16/2017  . Hypothyroidism 02/23/2017  . LLQ abdominal pain 09/08/2016  . Osteopenia of left femoral neck 03/23/2016  . Onychomycosis 03/23/2016  . Abnormal EKG 08/29/2013  . PAF (paroxysmal atrial fibrillation) ([place])   . Hypertension     Past Medical History:  Diagnosis Date  . Dysuria   . Heart murmur   . Hematuria, unspecified   . History of shingles   . History of swelling of feet   . Hypertension   . Irritable bowel syndrome   . Lichenification and lichen simplex chronicus   . Localized superficial swelling, mass, or lump   . Osteoarthrosis, unspecified whether generalized or localized, unspecified site    . Osteoporosis, unspecified   . PAF (paroxysmal atrial fibrillation) ([place])    Diagnosed years ago -- originally on Quinidine  . Sinus problem   . Synovial cyst, unspecified     Past Surgical History:  Procedure Laterality Date  . ANKLE SURGERY Left   . APPENDECTOMY    . AV FISTULA REPAIR    . FOOT SURGERY Left   . VAGINAL HYSTERECTOMY    . VESICOVAGINAL FISTULA CLOSURE W/ TAH      Social History   Tobacco Use  . Smoking status: Never Smoker  . Smokeless tobacco: Never Used  . Tobacco comment: smoking cessation materials not required  Substance Use Topics  . Alcohol use: No     Current Outpatient Medications:  .  alendronate (FOSAMAX) 70 MG tablet, TAKE 1 TABLET BY MOUTH ONCE A WEEK. TAKE WITH A FULL GLASS OF WATER ON AN EMPTY STOMACH, Disp: 13 tablet, Rfl: 0 .  aspirin 81 MG tablet, Take 1 tablet (81 mg total) by mouth daily. Do NOT take with Xarelto (just one or the other), Disp: 30 tablet, Rfl:  .  calcium carbonate (OSCAL) 1500 (600 Ca) MG TABS tablet, Take 1,500 mg by mouth daily with breakfast. , Disp: , Rfl:  .  Calcium Carbonate-Vitamin D (CALCIUM 500/VITAMIN D PO), Take 600 mg by mouth daily., Disp: , Rfl:  .  Horse Chestnut 300 MG CAPS, Take 1 capsule by mouth daily., Disp: , Rfl:  .  levothyroxine (SYNTHROID, LEVOTHROID) 25 MCG tablet, Take 1 tablet by mouth daily., Disp: , Rfl: 2 .  metoprolol succinate (TOPROL-XL) 50 MG 24 hr tablet, TAKE 1 TABLET BY MOUTH EVERY DAY, Disp: 90 tablet, Rfl: 1 .  Naftifine HCl (NAFTIN) 2 % CREA, Apply 1 drop topically 1 day or 1 dose., Disp: 60 g, Rfl: 2 .  polyethylene glycol powder (GLYCOLAX/MIRALAX) powder, Take 17 g by mouth daily. Dissolve 1 capful in 8 oz of liquid daily PRN, Disp: 3350 g, Rfl: 1 .  vitamin C (ASCORBIC ACID) 500 MG tablet, Take 500 mg by mouth daily., Disp: , Rfl:   Allergies  Allergen Reactions  . Anaprox [Naproxen Sodium] Rash    Review of Systems  Constitutional: Negative for chills, fever and  malaise/fatigue.  HENT: Negative for congestion, sinus pain and tinnitus.   Respiratory: Negative for cough and shortness of breath.   Cardiovascular: Positive for palpitations. Negative for chest pain and leg swelling.  Gastrointestinal: Negative for abdominal pain, diarrhea, nausea and vomiting.  Musculoskeletal: Negative for joint pain and myalgias.  Skin: Negative for rash.  Neurological: Negative for dizziness and headaches.  Psychiatric/Behavioral: The patient is not nervous/anxious and does not have insomnia.      No other specific complaints in a complete review of systems (except as listed in HPI above).  Objective  Vitals:   09/17/18 1133  BP: 126/70  Pulse: 93  Resp: 14  Temp: 98.5 F (36.9 C)  TempSrc: Oral  SpO2: 98%  Weight: 213 lb 3.2 oz (96.7 kg)  Height: 5\' 9"  (1.753 m)    Body mass index is 31.48 kg/m.  Nursing Note and Vital Signs reviewed.  Physical Exam Constitutional:      Appearance: Normal appearance. She is well-developed.  HENT:     Head: Normocephalic and atraumatic.     Right Ear: Hearing normal.     Left Ear: Hearing normal.  Eyes:     Conjunctiva/sclera: Conjunctivae normal.  Cardiovascular:     Rate and Rhythm: Normal rate. Rhythm irregular.     Pulses: Normal pulses.     Heart sounds: Normal heart sounds.  Pulmonary:     Effort: Pulmonary effort is normal.     Breath sounds: Normal breath sounds.  Musculoskeletal: Normal range of motion.  Neurological:     Mental Status: She is alert and oriented to person, place, and time.  Psychiatric:        Speech: Speech normal.        Behavior: Behavior normal. Behavior is cooperative.        Thought Content: Thought content normal.        Judgment: Judgment normal.        No results found for this or any previous visit (from the past 48 hour(s)).  Assessment & Plan  1. PAF (paroxysmal atrial fibrillation) ([place]) Request she sees cardiology  - metoprolol succinate (TOPROL-XL)  50 MG 24 hr tablet; Take 1 tablet (50 mg total) by mouth daily. Take with or immediately following a meal.  Dispense: 90 tablet; Refill: 1  2. Essential hypertension stable - metoprolol succinate (TOPROL-XL) 50 MG 24 hr tablet; Take 1 tablet (50 mg total) by mouth daily. Take with or immediately following a meal.  Dispense: 90 tablet; Refill: 1  3. Hypothyroidism, unspecified type stable  4. Osteopenia of left femoral neck - alendronate (FOSAMAX) 70 MG tablet; TAKE 1 TABLET BY MOUTH ONCE A WEEK. TAKE WITH A FULL GLASS OF WATER ON AN EMPTY STOMACH  Dispense: 13 tablet; Refill: 1 Continue supplementation and weight bearing exercise   5. Dyslipidemia Monitor levels today and  - Lipid Profile  6. Neck stiffness Heat and ROM exercise

## 2018-09-17 NOTE — Patient Instructions (Addendum)
- Restart fosamax 70mg  once weekly.  - Follow-up with cardiology.  Neck Exercises Neck exercises can be important for many reasons:  They can help you to improve and maintain flexibility in your neck. This can be especially important as you age.  They can help to make your neck stronger. This can make movement easier.  They can reduce or prevent neck pain.  They may help your upper back. Ask your health care provider which neck exercises would be best for you. Exercises to improve neck flexibility Neck stretch Repeat this exercise 3-5 times. 1. Do this exercise while standing or while sitting in a chair. 2. Place your feet flat on the floor, shoulder-width apart. 3. Slowly turn your head to the right. Turn it all the way to the right so you can look over your right shoulder. Do not tilt or tip your head. 4. Hold this position for 10-30 seconds. 5. Slowly turn your head to the left, to look over your left shoulder. 6. Hold this position for 10-30 seconds.  Neck retraction Repeat this exercise 8-10 times. Do this 3-4 times a day or as told by your health care provider. 1. Do this exercise while standing or while sitting in a sturdy chair. 2. Look straight ahead. Do not bend your neck. 3. Use your fingers to push your chin backward. Do not bend your neck for this movement. Continue to face straight ahead. If you are doing the exercise properly, you will feel a slight sensation in your throat and a stretch at the back of your neck. 4. Hold the stretch for 1-2 seconds. Relax and repeat. Exercises to improve neck strength Neck press Repeat this exercise 10 times. Do it first thing in the morning and right before bed or as told by your health care provider. 1. Lie on your back on a firm bed or on the floor with a pillow under your head. 2. Use your neck muscles to push your head down on the pillow and straighten your spine. 3. Hold the position as well as you can. Keep your head facing up  and your chin tucked. 4. Slowly count to 5 while holding this position. 5. Relax for a few seconds. Then repeat. Isometric strengthening Do a full set of these exercises 2 times a day or as told by your health care provider. 1. Sit in a supportive chair and place your hand on your forehead. 2. Push forward with your head and neck while pushing back with your hand. Hold for 10 seconds. 3. Relax. Then repeat the exercise 3 times. 4. Next, do thesequence again, this time putting your hand against the back of your head. Use your head and neck to push backward against the hand pressure. 5. Finally, do the same exercise on either side of your head, pushing sideways against the pressure of your hand. Prone head lifts Repeat this exercise 5 times. Do this 2 times a day or as told by your health care provider. 1. Lie face-down, resting on your elbows so that your chest and upper back are raised. 2. Start with your head facing downward, near your chest. Position your chin either on or near your chest. 3. Slowly lift your head upward. Lift until you are looking straight ahead. Then continue lifting your head as far back as you can stretch. 4. Hold your head up for 5 seconds. Then slowly lower it to your starting position. Supine head lifts Repeat this exercise 8-10 times. Do this 2 times  a day or as told by your health care provider. 1. Lie on your back, bending your knees to point to the ceiling and keeping your feet flat on the floor. 2. Lift your head slowly off the floor, raising your chin toward your chest. 3. Hold for 5 seconds. 4. Relax and repeat. Scapular retraction Repeat this exercise 5 times. Do this 2 times a day or as told by your health care provider. 1. Stand with your arms at your sides. Look straight ahead. 2. Slowly pull both shoulders backward and downward until you feel a stretch between your shoulder blades in your upper back. 3. Hold for 10-30 seconds. 4. Relax and  repeat. Contact a health care provider if:  Your neck pain or discomfort gets much worse when you do an exercise.  Your neck pain or discomfort does not improve within 2 hours after you exercise. If you have any of these problems, stop exercising right away. Do not do the exercises again unless your health care provider says that you can. Get help right away if:  You develop sudden, severe neck pain. If this happens, stop exercising right away. Do not do the exercises again unless your health care provider says that you can. This information is not intended to replace advice given to you by your health care provider. Make sure you discuss any questions you have with your health care provider. Document Released: 03/15/2015 Document Revised: 08/07/2017 Document Reviewed: 10/12/2014 Elsevier Interactive Patient Education  2019 [employer].

## 2018-09-23 ENCOUNTER — Ambulatory Visit: Payer: Medicare Other | Admitting: Podiatry

## 2018-10-02 ENCOUNTER — Ambulatory Visit (INDEPENDENT_AMBULATORY_CARE_PROVIDER_SITE_OTHER): Payer: Medicare Other | Admitting: Podiatry

## 2018-10-02 ENCOUNTER — Encounter: Payer: Self-pay | Admitting: Podiatry

## 2018-10-02 ENCOUNTER — Other Ambulatory Visit: Payer: Self-pay

## 2018-10-02 VITALS — Temp 97.6°F

## 2018-10-02 DIAGNOSIS — Q828 Other specified congenital malformations of skin: Secondary | ICD-10-CM

## 2018-10-02 DIAGNOSIS — B351 Tinea unguium: Secondary | ICD-10-CM

## 2018-10-02 DIAGNOSIS — M79676 Pain in unspecified toe(s): Secondary | ICD-10-CM

## 2018-10-02 NOTE — Progress Notes (Signed)
She presents today chief complaint of painfully elongated toenails calluses.  Objective: Pulses remain palpable no open lesions or wounds reactive hyper keratoma sub-first and second metatarsal heads of the left foot.  Toenails are long thick yellow dystrophic onychomycotic.  Assessment: Metatarsalgia capsulitis callus forefoot and pain in limb secondary to onychomycosis.  Plan: Debridement of all reactive hyperkeratotic tissue debridement of toenails 1 through 5.  Dispensed metatarsalgia silicone pads.  Follow-up with her in 3 months.

## 2018-12-18 ENCOUNTER — Encounter: Payer: Self-pay | Admitting: Nurse Practitioner

## 2018-12-18 ENCOUNTER — Ambulatory Visit (INDEPENDENT_AMBULATORY_CARE_PROVIDER_SITE_OTHER): Payer: Medicare Other | Admitting: Nurse Practitioner

## 2018-12-18 VITALS — BP 117/89 | Resp 16

## 2018-12-18 DIAGNOSIS — E039 Hypothyroidism, unspecified: Secondary | ICD-10-CM

## 2018-12-18 DIAGNOSIS — I10 Essential (primary) hypertension: Secondary | ICD-10-CM

## 2018-12-18 DIAGNOSIS — M85852 Other specified disorders of bone density and structure, left thigh: Secondary | ICD-10-CM | POA: Diagnosis not present

## 2018-12-18 DIAGNOSIS — E785 Hyperlipidemia, unspecified: Secondary | ICD-10-CM

## 2018-12-18 DIAGNOSIS — Z5181 Encounter for therapeutic drug level monitoring: Secondary | ICD-10-CM

## 2018-12-18 DIAGNOSIS — I480 Paroxysmal atrial fibrillation: Secondary | ICD-10-CM

## 2018-12-18 DIAGNOSIS — G894 Chronic pain syndrome: Secondary | ICD-10-CM

## 2018-12-18 DIAGNOSIS — R7303 Prediabetes: Secondary | ICD-10-CM

## 2018-12-18 DIAGNOSIS — R1032 Left lower quadrant pain: Secondary | ICD-10-CM

## 2018-12-18 DIAGNOSIS — G8929 Other chronic pain: Secondary | ICD-10-CM

## 2018-12-18 NOTE — Patient Instructions (Signed)

## 2018-12-18 NOTE — Progress Notes (Signed)
Virtual Visit via Video Note  I connected with Rebecca Anthony on 12/18/18 at  3:20 PM EDT by a video enabled telemedicine application and verified that I am speaking with the correct person using two identifiers.   Staff discussed the limitations of evaluation and management by telemedicine and the availability of in person appointments. The patient expressed understanding and agreed to proceed.  Patient location: home  My location: work office Other people present: husband- Rebecca Anthony HPI Cataracts Plans for cataract surgery on 01/02/2019   Paroxysmal Atrial Fibrillation Takes metoprolol 50 mg daily, states occasionally gets palpitations but not very often. Takes 81mg  ASA daily.  Was referred to Rebecca Anthony, cardiology but due to pandemic she pushed appointment out. Plans to reschedule with them.  Denies shortness of breath, chest pain, dizziness.   Hypertension Takes metoprolol 50 mg daily without any missed doses.  Compliant with low sodium diet Denies headaches, blurry vision, chest pain  BP Readings from Last 3 Encounters:  12/18/18 117/89  09/17/18 126/70  05/14/18 126/80   Hypothyroidism Takes synthroid 69mcg, follows up with Rebecca Anthony   Osteopenia Was taking fosamax weekly but was stopped by PCP last time but she is unsure of why- denies dental disease, dysphagia, stomach issues or any side effects. Restarted at last visit.  but she is currently taking vitamin D and Calcium supplements Dexa scan 04/16/2018 shows worsening osteopenia in left femur and left forearm since previous DEXA in 2017.  Dyslipidemia Patient wanted to make some diet adjustments before starting medications. Has been working on weight loss. She has cut back in portions in addition cutting back on sweets.  Lab Results  Component Value Date   CHOL 218 (H) 12/31/2017   HDL 74 12/31/2017   LDLCALC 128 (H) 12/31/2017   TRIG 67 12/31/2017   CHOLHDL 2.9 12/31/2017    Chronic pain  Follows up with  Rebecca Anthony at the pain clinic Patient has had left flank pain that has been ongoing for 2 years, had had a full work-up by urology, MRI of abdomen     PHQ2/9: Depression screen [facility] 2/9 12/18/2018 09/17/2018 05/14/2018 03/26/2018 01/18/2018  Decreased Interest 0 0 0 0 0  Down, Depressed, Hopeless 0 0 0 0 0  PHQ - 2 Score 0 0 0 0 0  Altered sleeping 0 0 0 0 0  Tired, decreased energy 0 0 0 0 1  Change in appetite 0 0 0 0 0  Feeling bad or failure about yourself  0 0 0 0 0  Trouble concentrating 0 0 0 0 0  Moving slowly or fidgety/restless 0 0 0 0 0  Suicidal thoughts 0 0 0 0 0  PHQ-9 Score 0 0 0 0 1  Difficult doing work/chores Somewhat difficult Not difficult at all Not difficult at all Not difficult at all Not difficult at all  Some recent data might be hidden    PHQ reviewed. Negative  Patient Active Problem List   Diagnosis Date Noted  . Dyslipidemia 05/30/2018  . Prediabetes 05/14/2018  . Thyroid nodule 01/28/2018  . Gallbladder polyp 01/28/2018  . Cyst of pancreas 01/28/2018  . 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 Foraminal stenosis of thoracic region 01/28/2018  . Idiopathic scoliosis of thoracolumbar spine 11/28/2017  . Sigmoid diverticulosis 11/28/2017  . Chronic ankle pain (Fourth Area of Pain) (Left) 11/07/2017  . Chronic abdominal pain (LLQ) (Primary Area of Pain) (Left) 10/31/2017  . Chronic low back pain (Secondary Area of Pain) (Bilateral) (L>R) 10/31/2017  .  Chronic pain syndrome 10/31/2017  . Chronic upper back pain [facility] Area of Pain) (Bilateral) (L>R) 10/31/2017  . Obesity (BMI 30.0-34.9) 09/13/2017  . Hematuria, microscopic 07/16/2017  . Hypothyroidism 02/23/2017  . LLQ abdominal pain 09/08/2016  . Osteopenia of left femoral neck 03/23/2016  . Onychomycosis 03/23/2016  . Abnormal EKG 08/29/2013  . PAF (paroxysmal atrial fibrillation) ([place])   . Hypertension     Past Medical History:  Diagnosis Date  . Dysuria   . Heart murmur   . Hematuria,  unspecified   . History of shingles   . History of swelling of feet   . Hypertension   . Irritable bowel syndrome   . Lichenification and lichen simplex chronicus   . Localized superficial swelling, mass, or lump   . Osteoarthrosis, unspecified whether generalized or localized, unspecified site   . Osteoporosis, unspecified   . PAF (paroxysmal atrial fibrillation) ([place])    Diagnosed years ago -- originally on Quinidine  . Sinus problem   . Synovial cyst, unspecified     Past Surgical History:  Procedure Laterality Date  . ANKLE SURGERY Left   . APPENDECTOMY    . AV FISTULA REPAIR    . FOOT SURGERY Left   . VAGINAL HYSTERECTOMY    . VESICOVAGINAL FISTULA CLOSURE W/ TAH      Social History   Tobacco Use  . Smoking status: Never Smoker  . Smokeless tobacco: Never Used  . Tobacco comment: smoking cessation materials not required  Substance Use Topics  . Alcohol use: No     Current Outpatient Medications:  .  aspirin 81 MG tablet, Take 1 tablet (81 mg total) by mouth daily. Do NOT take with Xarelto (just one or the other), Disp: 30 tablet, Rfl:  .  calcium carbonate (OSCAL) 1500 (600 Ca) MG TABS tablet, Take 1,500 mg by mouth daily with breakfast. , Disp: , Rfl:  .  Calcium Carbonate-Vitamin D (CALCIUM 500/VITAMIN D PO), Take 600 mg by mouth daily., Disp: , Rfl:  .  Horse Chestnut 300 MG CAPS, Take 1 capsule by mouth daily., Disp: , Rfl:  .  levothyroxine (SYNTHROID, LEVOTHROID) 25 MCG tablet, Take 1 tablet by mouth daily., Disp: , Rfl: 2 .  metoprolol succinate (TOPROL-XL) 50 MG 24 hr tablet, Take 1 tablet (50 mg total) by mouth daily. Take with or immediately following a meal., Disp: 90 tablet, Rfl: 1 .  polyethylene glycol powder (GLYCOLAX/MIRALAX) powder, Take 17 g by mouth daily. Dissolve 1 capful in 8 oz of liquid daily PRN, Disp: 3350 g, Rfl: 1 .  vitamin C (ASCORBIC ACID) 500 MG tablet, Take 500 mg by mouth daily., Disp: , Rfl:   Allergies  Allergen Reactions  .  Anaprox [Naproxen Sodium] Rash    ROS   No other specific complaints in a complete review of systems (except as listed in HPI above).  Objective  Vitals:   12/18/18 1518  BP: 117/89  Resp: 16     There is no height or weight on file to calculate BMI.  Nursing Note and Vital Signs reviewed.  Physical Exam  Constitutional: Patient appears well-developed and well-nourished. No distress.  HENT: Head: Normocephalic and atraumatic. Pulmonary/Chest: Effort normal  Neurological: alert and oriented, speech normal.  Skin: No rash noted. No erythema.  Psychiatric: Patient has a normal mood and affect. behavior is normal. Judgment and thought content normal.    Assessment & Plan  1. PAF (paroxysmal atrial fibrillation) ([place]) Follow-up with cards after eye surgery  -  COMPLETE METABOLIC PANEL WITH GFR  2. Essential hypertension Stable continue meds - COMPLETE METABOLIC PANEL WITH GFR  3. Hypothyroidism, unspecified type Follow-up with endo - TSH  4. Osteopenia of left femoral neck Discussed dexa with patient, wants to hold-off on fosamax and will re-start if worsening dexa at recheck next year   5. Chronic pain syndrome Stopped seeing pain management- but will return if pain worsens.   6. Dyslipidemia Diet  - Lipid Profile  7. Medication monitoring encounter - COMPLETE METABOLIC PANEL WITH GFR  8. Prediabetes - HgB A1c  9. Chronic abdominal pain (LLQ) (Primary Area of Pain) (Left) Discussed at length her work-up; has seen neurosurgery and urology for this without obvious cause for pain. MRI of abdomen recommends repeat 01/2020. Patient states her pain is not changed. Denies any other associated pains, stopped seeing pain management despite getting some relief with them because she wanted to know the root cause of pain. She states she has seen a GI provider but cannot recall the name- do not see notes in the last 2 years. She will contact us and we can request notes-  will consider referral if she has not seen someone.    Follow Up Instructions:    I discussed the assessment and treatment plan with the patient. The patient was provided an opportunity to ask questions and all were answered. The patient agreed with the plan and demonstrated an understanding of the instructions.   The patient was advised to call back or seek an in-person evaluation if the symptoms worsen or if the condition fails to improve as anticipated.  I provided 23 minutes of non-face-to-face time during this encounter.   Fredderick Severance, NP

## 2018-12-27 ENCOUNTER — Encounter: Payer: Self-pay | Admitting: *Deleted

## 2018-12-30 ENCOUNTER — Other Ambulatory Visit: Payer: Self-pay

## 2018-12-30 ENCOUNTER — Other Ambulatory Visit
Admission: RE | Admit: 2018-12-30 | Discharge: 2018-12-30 | Disposition: A | Discharge status: Home / Self Care | Payer: Medicare Other | Source: Ambulatory Visit | Attending: Ophthalmology | Admitting: Ophthalmology

## 2018-12-30 DIAGNOSIS — Z20828 Contact with and (suspected) exposure to other viral communicable diseases: Secondary | ICD-10-CM | POA: Insufficient documentation

## 2018-12-30 DIAGNOSIS — H2511 Age-related nuclear cataract, right eye: Secondary | ICD-10-CM | POA: Insufficient documentation

## 2018-12-30 DIAGNOSIS — Z01812 Encounter for preprocedural laboratory examination: Secondary | ICD-10-CM | POA: Diagnosis present

## 2018-12-31 LAB — SARS CORONAVIRUS 2 (TAT 6-24 HRS): SARS Coronavirus 2: NEGATIVE

## 2019-01-02 ENCOUNTER — Encounter: Payer: Self-pay | Admitting: *Deleted

## 2019-01-02 ENCOUNTER — Ambulatory Visit
Admission: RE | Admit: 2019-01-02 | Discharge: 2019-01-02 | Disposition: A | Discharge status: Home / Self Care | Payer: Medicare Other | Source: Ambulatory Visit | Attending: Ophthalmology | Admitting: Ophthalmology

## 2019-01-02 ENCOUNTER — Encounter
Admission: RE | Disposition: A | Discharge status: Home / Self Care | Payer: Self-pay | Source: Ambulatory Visit | Attending: Ophthalmology

## 2019-01-02 ENCOUNTER — Other Ambulatory Visit: Payer: Self-pay

## 2019-01-02 ENCOUNTER — Ambulatory Visit: Payer: Medicare Other | Admitting: Anesthesiology

## 2019-01-02 DIAGNOSIS — E039 Hypothyroidism, unspecified: Secondary | ICD-10-CM | POA: Insufficient documentation

## 2019-01-02 DIAGNOSIS — Z7982 Long term (current) use of aspirin: Secondary | ICD-10-CM | POA: Diagnosis not present

## 2019-01-02 DIAGNOSIS — Z79890 Hormone replacement therapy (postmenopausal): Secondary | ICD-10-CM | POA: Diagnosis not present

## 2019-01-02 DIAGNOSIS — Z79899 Other long term (current) drug therapy: Secondary | ICD-10-CM | POA: Diagnosis not present

## 2019-01-02 DIAGNOSIS — H2511 Age-related nuclear cataract, right eye: Secondary | ICD-10-CM | POA: Insufficient documentation

## 2019-01-02 DIAGNOSIS — I10 Essential (primary) hypertension: Secondary | ICD-10-CM | POA: Insufficient documentation

## 2019-01-02 HISTORY — DX: Pure hypercholesterolemia, unspecified: E78.00

## 2019-01-02 HISTORY — DX: Diverticulitis of intestine, part unspecified, without perforation or abscess without bleeding: K57.92

## 2019-01-02 HISTORY — PX: CATARACT EXTRACTION W/PHACO: SHX586

## 2019-01-02 HISTORY — DX: Hypothyroidism, unspecified: E03.9

## 2019-01-02 HISTORY — DX: Diverticulosis of intestine, part unspecified, without perforation or abscess without bleeding: K57.90

## 2019-01-02 SURGERY — PHACOEMULSIFICATION, CATARACT, WITH IOL INSERTION
Anesthesia: Monitor Anesthesia Care | Site: Eye | Laterality: Right

## 2019-01-02 MED ORDER — TETRACAINE HCL 0.5 % OP SOLN
OPHTHALMIC | Status: AC
  Administered 2019-01-02: 07:00:00 1 [drp] via OPHTHALMIC
  Filled 2019-01-02: qty 4

## 2019-01-02 MED ORDER — NA HYALUR & NA CHOND-NA HYALUR 0.55-0.5 ML IO KIT
PACK | INTRAOCULAR | Status: AC
  Filled 2019-01-02: qty 1.05

## 2019-01-02 MED ORDER — MOXIFLOXACIN HCL 0.5 % OP SOLN
OPHTHALMIC | Status: DC | PRN
  Administered 2019-01-02: .2 mL via OPHTHALMIC

## 2019-01-02 MED ORDER — EPINEPHRINE PF 1 MG/ML IJ SOLN
INTRAOCULAR | Status: DC | PRN
  Administered 2019-01-02: 1 mL via OPHTHALMIC

## 2019-01-02 MED ORDER — LIDOCAINE HCL (PF) 4 % IJ SOLN
INTRAMUSCULAR | Status: AC
  Filled 2019-01-02: qty 5

## 2019-01-02 MED ORDER — FENTANYL CITRATE (PF) 100 MCG/2ML IJ SOLN
INTRAMUSCULAR | Status: AC
  Filled 2019-01-02: qty 2

## 2019-01-02 MED ORDER — ONDANSETRON HCL 4 MG/2ML IJ SOLN
INTRAMUSCULAR | Status: DC | PRN
  Administered 2019-01-02: 4 mg via INTRAVENOUS

## 2019-01-02 MED ORDER — TETRACAINE HCL 0.5 % OP SOLN
1.0000 [drp] | OPHTHALMIC | Status: DC | PRN
  Administered 2019-01-02: 07:00:00 1 [drp] via OPHTHALMIC

## 2019-01-02 MED ORDER — POVIDONE-IODINE 5 % OP SOLN
OPHTHALMIC | Status: DC | PRN
  Administered 2019-01-02: 1 via OPHTHALMIC

## 2019-01-02 MED ORDER — MOXIFLOXACIN HCL 0.5 % OP SOLN
OPHTHALMIC | Status: AC
  Filled 2019-01-02: qty 3

## 2019-01-02 MED ORDER — MIDAZOLAM HCL 2 MG/2ML IJ SOLN
INTRAMUSCULAR | Status: AC
  Filled 2019-01-02: qty 2

## 2019-01-02 MED ORDER — NEOMYCIN-POLYMYXIN-DEXAMETH 3.5-10000-0.1 OP OINT
TOPICAL_OINTMENT | OPHTHALMIC | Status: AC
  Filled 2019-01-02: qty 3.5

## 2019-01-02 MED ORDER — DORZOLAMIDE HCL-TIMOLOL MAL 2-0.5 % OP SOLN
OPHTHALMIC | Status: AC
  Filled 2019-01-02: qty 10

## 2019-01-02 MED ORDER — TRYPAN BLUE 0.06 % OP SOLN
OPHTHALMIC | Status: AC
  Filled 2019-01-02: qty 0.5

## 2019-01-02 MED ORDER — MOXIFLOXACIN HCL 0.5 % OP SOLN - NO CHARGE
1.0000 [drp] | Freq: Once | OPHTHALMIC | Status: DC
  Filled 2019-01-02: qty 3

## 2019-01-02 MED ORDER — CARBACHOL 0.01 % IO SOLN
INTRAOCULAR | Status: DC | PRN
  Administered 2019-01-02: 0.5 mL via INTRAOCULAR

## 2019-01-02 MED ORDER — LIDOCAINE HCL (PF) 4 % IJ SOLN
INTRAOCULAR | Status: DC | PRN
  Administered 2019-01-02: 2.25 mL via OPHTHALMIC

## 2019-01-02 MED ORDER — ARMC OPHTHALMIC DILATING DROPS
1.0000 "application " | OPHTHALMIC | Status: AC
  Administered 2019-01-02 (×3): 1 via OPHTHALMIC

## 2019-01-02 MED ORDER — ARMC OPHTHALMIC DILATING DROPS
OPHTHALMIC | Status: AC
  Administered 2019-01-02: 1 via OPHTHALMIC
  Filled 2019-01-02: qty 0.5

## 2019-01-02 MED ORDER — SODIUM CHLORIDE 0.9 % IV SOLN
INTRAVENOUS | Status: DC
  Administered 2019-01-02: 07:00:00 via INTRAVENOUS

## 2019-01-02 MED ORDER — MIDAZOLAM HCL 2 MG/2ML IJ SOLN
INTRAMUSCULAR | Status: DC | PRN
  Administered 2019-01-02 (×2): 1 mg via INTRAVENOUS

## 2019-01-02 MED ORDER — POVIDONE-IODINE 5 % OP SOLN
OPHTHALMIC | Status: AC
  Filled 2019-01-02: qty 30

## 2019-01-02 MED ORDER — NA CHONDROIT SULF-NA HYALURON 40-17 MG/ML IO SOLN
INTRAOCULAR | Status: AC
  Filled 2019-01-02: qty 1

## 2019-01-02 MED ORDER — NA CHONDROIT SULF-NA HYALURON 40-17 MG/ML IO SOLN
INTRAOCULAR | Status: DC | PRN
  Administered 2019-01-02: 1 mL via INTRAOCULAR

## 2019-01-02 MED ORDER — EPINEPHRINE PF 1 MG/ML IJ SOLN
INTRAMUSCULAR | Status: AC
  Filled 2019-01-02: qty 1

## 2019-01-02 SURGICAL SUPPLY — 16 items
GLOVE BIO SURGEON STRL SZ8 (GLOVE) ×3 IMPLANT
GLOVE BIOGEL M 6.5 STRL (GLOVE) ×3 IMPLANT
GLOVE SURG LX 8.0 MICRO (GLOVE) ×2
GLOVE SURG LX STRL 8.0 MICRO (GLOVE) ×1 IMPLANT
GOWN STRL REUS W/ TWL LRG LVL3 (GOWN DISPOSABLE) ×2 IMPLANT
GOWN STRL REUS W/TWL LRG LVL3 (GOWN DISPOSABLE) ×4
LABEL CATARACT MEDS ST (LABEL) ×3 IMPLANT
LENS IOL TECNIS ITEC 20.5 (Intraocular Lens) ×2 IMPLANT
PACK CATARACT (MISCELLANEOUS) ×3 IMPLANT
PACK CATARACT BRASINGTON LX (MISCELLANEOUS) ×3 IMPLANT
PACK EYE AFTER SURG (MISCELLANEOUS) ×3 IMPLANT
SOL BSS BAG (MISCELLANEOUS) ×3
SOLUTION BSS BAG (MISCELLANEOUS) ×1 IMPLANT
SYR 5ML LL (SYRINGE) ×3 IMPLANT
WATER STERILE IRR 250ML POUR (IV SOLUTION) ×3 IMPLANT
WIPE NON LINTING 3.25X3.25 (MISCELLANEOUS) ×3 IMPLANT

## 2019-01-02 NOTE — Anesthesia Preprocedure Evaluation (Addendum)
Anesthesia Evaluation  Patient identified by MRN, date of birth, ID band Patient awake    Reviewed: Allergy & Precautions, H&P , NPO status , reviewed documented beta blocker date and time   Airway Mallampati: II  TM Distance: >3 FB Neck ROM: full    Dental  (+) Partial Upper   Pulmonary    Pulmonary exam normal        Cardiovascular hypertension, + dysrhythmias Atrial Fibrillation + Valvular Problems/Murmurs  Rhythm:irregular  ECHO 2015 Echo results:  Good news: Essentially normal echocardiogram and normal pump function and normal valve function. No signs to suggest heart attack..  EF: 55-60%.  No regional wall motion abnormalities    Neuro/Psych    GI/Hepatic neg GERD  ,  Endo/Other  Hypothyroidism   Renal/GU      Musculoskeletal  (+) Arthritis ,   Abdominal   Peds  Hematology   Anesthesia Other Findings Past Medical History: No date: Diverticulitis No date: Diverticulosis No date: Dysuria No date: Heart murmur No date: Hematuria, unspecified No date: History of shingles No date: History of swelling of feet No date: Hypercholesterolemia No date: Hypertension No date: Hypothyroidism No date: Irritable bowel syndrome No date: Lichenification and lichen simplex chronicus No date: Localized superficial swelling, mass, or lump No date: Osteoarthrosis, unspecified whether generalized or localized,  unspecified site No date: Osteoporosis, unspecified No date: PAF (paroxysmal atrial fibrillation) (HCC)     Comment:  Diagnosed years ago -- originally on Quinidine No date: Sinus problem No date: Synovial cyst, unspecified  Past Surgical History: No date: ABDOMINAL HYSTERECTOMY No date: ANKLE SURGERY; Left     Comment:  Torned tendon No date: APPENDECTOMY No date: AV FISTULA REPAIR No date: BUNIONECTOMY No date: COLONOSCOPY No date: FOOT SURGERY; Left No date: VAGINAL HYSTERECTOMY No date:  VESICOVAGINAL FISTULA CLOSURE W/ TAH  BMI    Body Mass Index: 31.31 kg/m      Reproductive/Obstetrics                            Anesthesia Physical Anesthesia Plan  ASA: III  Anesthesia Plan: MAC   Post-op Pain Management:    Induction: Intravenous  PONV Risk Score and Plan: Treatment may vary due to age or medical condition and TIVA  Airway Management Planned: Nasal Cannula and Natural Airway  Additional Equipment:   Intra-op Plan:   Post-operative Plan:   Informed Consent: I have reviewed the patients History and Physical, chart, labs and discussed the procedure including the risks, benefits and alternatives for the proposed anesthesia with the patient or authorized representative who has indicated his/her understanding and acceptance.     Dental Advisory Given  Plan Discussed with: CRNA  Anesthesia Plan Comments:         Anesthesia Quick Evaluation

## 2019-01-02 NOTE — Anesthesia Postprocedure Evaluation (Signed)
Anesthesia Post Note  Patient: PATCHES MCDONNELL  Procedure(s) Performed: CATARACT EXTRACTION PHACO AND INTRAOCULAR LENS PLACEMENT (IOC) RIGHT (Right Eye)  Patient location during evaluation: Phase II Anesthesia Type: MAC Level of consciousness: awake and alert Pain management: pain level controlled Vital Signs Assessment: post-procedure vital signs reviewed and stable Respiratory status: spontaneous breathing, nonlabored ventilation and respiratory function stable Cardiovascular status: blood pressure returned to baseline and stable Postop Assessment: no apparent nausea or vomiting Anesthetic complications: no     Last Vitals:  Vitals:   01/02/19 0622 01/02/19 0748  BP: (!) 154/108 (!) 161/95  Pulse: 88 92  Resp: 16 16  Temp: 37.1 C (!) 36.4 C  SpO2: 98% 99%    Last Pain:  Vitals:   01/02/19 0748  TempSrc: Temporal  PainSc: 0-No pain                 Alphonsus Sias

## 2019-01-02 NOTE — Anesthesia Post-op Follow-up Note (Signed)
Anesthesia QCDR form completed.        

## 2019-01-02 NOTE — H&P (Signed)
All labs reviewed. Abnormal studies sent to patients PCP when indicated.  Previous H&P reviewed, patient examined, there are NO CHANGES.  Corydon Schweiss Porfilio9/17/20207:23 AM

## 2019-01-02 NOTE — Op Note (Signed)
PREOPERATIVE DIAGNOSIS:  Nuclear sclerotic cataract of the right eye.   POSTOPERATIVE DIAGNOSIS:  nuclear sclerotic cataract right eye   OPERATIVE PROCEDURE: Procedure(s): CATARACT EXTRACTION PHACO AND INTRAOCULAR LENS PLACEMENT (IOC) RIGHT   SURGEON:  Birder Robson, MD.   ANESTHESIA:  Anesthesiologist: Alphonsus Sias, MD CRNA: Kelton Pillar, CRNA  1.      Managed anesthesia care. 2.      0.64ml of Shugarcaine was instilled in the eye following the paracentesis.   COMPLICATIONS:  None.   TECHNIQUE:   Stop and chop   DESCRIPTION OF PROCEDURE:  The patient was examined and consented in the preoperative holding area where the aforementioned topical anesthesia was applied to the right eye and then brought back to the Operating Room where the right eye was prepped and draped in the usual sterile ophthalmic fashion and a lid speculum was placed. A paracentesis was created with the side port blade and the anterior chamber was filled with viscoelastic. A near clear corneal incision was performed with the steel keratome. A continuous curvilinear capsulorrhexis was performed with a cystotome followed by the capsulorrhexis forceps. Hydrodissection and hydrodelineation were carried out with BSS on a blunt cannula. The lens was removed in a stop and chop  technique and the remaining cortical material was removed with the irrigation-aspiration handpiece. The capsular bag was inflated with viscoelastic and the Technis ZCB00  lens was placed in the capsular bag without complication. The remaining viscoelastic was removed from the eye with the irrigation-aspiration handpiece. The wounds were hydrated. The anterior chamber was flushed with Miostat and the eye was inflated to physiologic pressure. 0.77ml of Vigamox was placed in the anterior chamber. The wounds were found to be water tight. The eye was dressed with Vigamox. The patient was given protective glasses to wear throughout the day and a  shield with which to sleep tonight. The patient was also given drops with which to begin a drop regimen today and will follow-up with me in one day. Implant Name Type Inv. Item Serial No. Manufacturer Lot No. LRB No. Used Action  LENS IOL DIOP 20.5 - [id number] Intraocular Lens LENS IOL DIOP 20.5 [id number]  Right 1 Implanted   Procedure(s) with comments: CATARACT EXTRACTION PHACO AND INTRAOCULAR LENS PLACEMENT (IOC) RIGHT (Right) - [place] 00:42 CDE 7.91 fluid pack lot # [id number]  Electronically signed: Birder Robson 01/02/2019 7:47 AM

## 2019-01-02 NOTE — Transfer of Care (Signed)
Immediate Anesthesia Transfer of Care Note  Patient: Rebecca Anthony  Procedure(s) Performed: CATARACT EXTRACTION PHACO AND INTRAOCULAR LENS PLACEMENT (IOC) RIGHT (Right Eye)  Patient Location: PACU  Anesthesia Type:MAC  Level of Consciousness: awake, alert  and patient cooperative  Airway & Oxygen Therapy: Patient Spontanous Breathing  Post-op Assessment: Report given to RN and Post -op Vital signs reviewed and stable  Post vital signs: Reviewed and stable  Last Vitals:  Vitals Value Taken Time  BP    Temp    Pulse    Resp    SpO2      Last Pain:  Vitals:   01/02/19 0622  TempSrc: Oral  PainSc: 0-No pain         Complications: No apparent anesthesia complications

## 2019-01-02 NOTE — Discharge Instructions (Addendum)
Eye Surgery Discharge Instructions  Expect mild scratchy sensation or mild soreness. DO NOT RUB YOUR EYE!  The day of surgery:  Minimal physical activity, but bed rest is not required  No reading, computer work, or close hand work  No bending, lifting, or straining.  May watch TV  For 24 hours:  No driving, legal decisions, or alcoholic beverages  Safety precautions  Eat anything you prefer: It is better to start with liquids, then soup then solid foods.  Solar shield eyeglasses should be worn for comfort in the sunlight/patch while sleeping  Resume all regular medications including aspirin or Coumadin if these were discontinued prior to surgery. You may shower, bathe, shave, or wash your hair. Tylenol may be taken for mild discomfort. Follow eye drop instruction sheet as reviewed.  Call your doctor if you experience significant pain, nausea, or vomiting, fever > 101 or other signs of infection. [phone] or [phone] Specific instructions:  Follow-up Information    Birder Robson, MD Follow up.   Specialty: Ophthalmology Why: 01-03-19 @ 11:00 am Contact information: [address] [place] [address] [phone]

## 2019-01-09 ENCOUNTER — Ambulatory Visit: Payer: Medicare Other

## 2019-01-22 ENCOUNTER — Ambulatory Visit: Payer: Medicare Other | Admitting: Family Medicine

## 2019-01-22 ENCOUNTER — Ambulatory Visit: Payer: Medicare Other

## 2019-01-24 ENCOUNTER — Encounter: Payer: Self-pay | Admitting: Family Medicine

## 2019-01-24 ENCOUNTER — Ambulatory Visit: Payer: Medicare Other | Admitting: Family Medicine

## 2019-01-24 ENCOUNTER — Other Ambulatory Visit: Payer: Self-pay

## 2019-01-24 VITALS — BP 136/84 | HR 62 | Temp 98.3°F | Resp 14 | Ht 69.0 in | Wt 214.6 lb

## 2019-01-24 DIAGNOSIS — I10 Essential (primary) hypertension: Secondary | ICD-10-CM

## 2019-01-24 DIAGNOSIS — E039 Hypothyroidism, unspecified: Secondary | ICD-10-CM | POA: Diagnosis not present

## 2019-01-24 DIAGNOSIS — Z23 Encounter for immunization: Secondary | ICD-10-CM

## 2019-01-24 DIAGNOSIS — E785 Hyperlipidemia, unspecified: Secondary | ICD-10-CM | POA: Diagnosis not present

## 2019-01-24 DIAGNOSIS — G8929 Other chronic pain: Secondary | ICD-10-CM

## 2019-01-24 DIAGNOSIS — R1032 Left lower quadrant pain: Secondary | ICD-10-CM

## 2019-01-24 DIAGNOSIS — G894 Chronic pain syndrome: Secondary | ICD-10-CM

## 2019-01-24 NOTE — Progress Notes (Signed)
Name: JASONNA VANROSSUM   MRN: [id number]    DOB: [birth year]   Date:01/24/2019       Progress Note  Chief Complaint  Patient presents with  . Follow-up  . Hypertension     Subjective:   Rebecca Anthony is a 73 y.o. female, presents to clinic for routine follow up on the conditions listed above.  Hypertension:   BP with recent procedure and BP monitoring at home has been high - at home BP 179/115,  With procedure 161/95 -she reports a little bit of dizziness associated with the higher readings but she also think she has made herself very anxious about a not sure if she is just worried and anxious causing some dizziness Currently managed on metoprolol 50 mg -she was just seen about 1 month ago for her routine condition then blood pressure at that time was good she is due for labs, she is not having any other side effects of her medication or symptoms Today in clinic her blood pressure is normal and back to her baseline BP Readings from Last 3 Encounters:  01/24/19 136/84  01/02/19 (!) 161/95  12/18/18 117/89  Pt denies CP, SOB, exertional sx, LE edema, palpitation, Ha's, visual disturbances Dietary efforts for BP?  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  She has multiple vague complaints about abdominal pain for several years which she has worked with her old PCPs on working up I reviewed the last imaging with her and explained the 2-year follow-up.  She is not currently having any nausea vomiting abdominal bloating, diarrhea, no change in her appetite, no melena or hematochezia  She also wants to talk about back pain and left hip pain and sciatica which she believes she has.  Upon reviewing the chart she has long history of chronic pain and degenerative disks, has seen pain management specialist, was due for procedures to treat the pain that she  is currently complaining about.  She states that she has daily left hip pain near groin that radiates down leg, always hurts more at night, taking advil and old muscle relaxers that just knock her out.  Pain is severe.  She has seen neuro surgeon before -she cannot remember his name, she has hx of thoracic disc herniation, chronic back pain.  When discussing options with her about physical medicine, neurosurgery, rehab (PT) she says that she will go back to her pain management doctor but then also changes her mind  Patient Active Problem List   Diagnosis Date Noted  . Dyslipidemia 05/30/2018  . Prediabetes 05/14/2018  . Thyroid nodule 01/28/2018  . Gallbladder polyp 01/28/2018  . Cyst of pancreas 01/28/2018  . 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 Foraminal stenosis of thoracic region 01/28/2018  . Idiopathic scoliosis of thoracolumbar spine 11/28/2017  . Sigmoid diverticulosis 11/28/2017  . Chronic ankle pain (Fourth Area of Pain) (Left) 11/07/2017  . Chronic abdominal pain (LLQ) (Primary Area of Pain) (Left) 10/31/2017  . Chronic low back pain (Secondary Area of Pain) (Bilateral) (L>R) 10/31/2017  . Chronic pain syndrome 10/31/2017  . Chronic upper back pain [facility] Area of Pain) (Bilateral) (L>R) 10/31/2017  . Obesity (BMI 30.0-34.9) 09/13/2017  . Hematuria, microscopic 07/16/2017  . Hypothyroidism 02/23/2017  . LLQ abdominal pain 09/08/2016  . Osteopenia of left femoral neck 03/23/2016  .  Onychomycosis 03/23/2016  . Abnormal EKG 08/29/2013  . PAF (paroxysmal atrial fibrillation) ([place])   . Hypertension     Past Surgical History:  Procedure Laterality Date  . ABDOMINAL HYSTERECTOMY    . ANKLE SURGERY Left    Torned tendon  . APPENDECTOMY    . AV FISTULA REPAIR    . BUNIONECTOMY    . CATARACT EXTRACTION W/PHACO Right 01/02/2019   Procedure: CATARACT EXTRACTION PHACO AND INTRAOCULAR LENS PLACEMENT ([place]) RIGHT;  Surgeon: Birder Robson, MD;  Location: [facility] ORS;  Service:  Ophthalmology;  Laterality: Right;  [place] 00:42 CDE 7.91 fluid pack lot # [id number]  . COLONOSCOPY    . FOOT SURGERY Left   . VAGINAL HYSTERECTOMY    . VESICOVAGINAL FISTULA CLOSURE W/ TAH      Family History  Problem Relation Age of Onset  . Diabetes Mother   . Hypertension Mother   . Heart failure Mother   . Heart disease Father   . Hypertension Father   . Thyroid disease Sister   . Healthy Sister   . Breast cancer Neg Hx     Social History   Socioeconomic History  . Marital status: Married    Spouse name: [name]  . Number of children: 1  . Years of education: Not on file  . Highest education level: Bachelor's degree (e.g., BA, AB, BS)  Occupational History  . Occupation: Retired  Scientific laboratory technician  . Financial resource strain: Not hard at all  . Food insecurity    Worry: Never true    Inability: Never true  . Transportation needs    Medical: No    Non-medical: No  Tobacco Use  . Smoking status: Never Smoker  . Smokeless tobacco: Never Used  . Tobacco comment: smoking cessation materials not required  Substance and Sexual Activity  . Alcohol use: No  . Drug use: No  . Sexual activity: Yes    Birth control/protection: None, Surgical  Lifestyle  . Physical activity    Days per week: 0 days    Minutes per session: 0 min  . Stress: Not at all  Relationships  . Social Herbalist on phone: Patient refused    Gets together: Patient refused    Attends religious service: Patient refused    Active member of club or organization: Patient refused    Attends meetings of clubs or organizations: Patient refused    Relationship status: Married  . Intimate partner violence    Fear of current or ex partner: No    Emotionally abused: No    Physically abused: No    Forced sexual activity: No  Other Topics Concern  . Not on file  Social History Narrative   Recently moved from [facility] back to [facility].     Current Outpatient Medications:  .  aspirin 81 MG tablet, Take 1  tablet (81 mg total) by mouth daily. Do NOT take with Xarelto (just one or the other), Disp: 30 tablet, Rfl:  .  calcium carbonate (OSCAL) 1500 (600 Ca) MG TABS tablet, Take 1,500 mg by mouth daily with breakfast. , Disp: , Rfl:  .  [place] 300 MG CAPS, Take 1 capsule by mouth daily., Disp: , Rfl:  .  levothyroxine (SYNTHROID, LEVOTHROID) 25 MCG tablet, Take 1 tablet by mouth daily., Disp: , Rfl: 2 .  metoprolol succinate (TOPROL-XL) 50 MG 24 hr tablet, Take 1 tablet (50 mg total) by mouth daily. Take with or immediately following a meal., Disp:  90 tablet, Rfl: 1 .  polyethylene glycol powder (GLYCOLAX/MIRALAX) powder, Take 17 g by mouth daily. Dissolve 1 capful in 8 oz of liquid daily PRN, Disp: 3350 g, Rfl: 1 .  vitamin C (ASCORBIC ACID) 500 MG tablet, Take 500 mg by mouth daily., Disp: , Rfl:   Allergies  Allergen Reactions  . Anaprox [Naproxen Sodium] Rash    I personally reviewed active problem list, medication list, allergies, notes from last encounter, lab results, imaging with the patient/caregiver today.  Review of Systems  Constitutional: Negative.   HENT: Negative.   Eyes: Negative.   Respiratory: Negative.   Cardiovascular: Negative.   Gastrointestinal: Negative.   Endocrine: Negative.   Genitourinary: Negative.   Musculoskeletal: Negative.   Skin: Negative.   Allergic/Immunologic: Negative.   Neurological: Negative.   Hematological: Negative.   Psychiatric/Behavioral: Negative.   All other systems reviewed and are negative.    Objective:    Vitals:   01/24/19 1441  BP: 136/84  Pulse: 62  Resp: 14  Temp: 98.3 F (36.8 C)  SpO2: 98%  Weight: 214 lb 9.6 oz (97.3 kg)  Height: 5\' 9"  (1.753 m)    Body mass index is 31.69 kg/m.  Physical Exam Vitals signs and nursing note reviewed.  Constitutional:      General: She is not in acute distress.    Appearance: Normal appearance. She is well-developed. She is not ill-appearing, toxic-appearing or  diaphoretic.     Interventions: Face mask in place.  HENT:     Head: Normocephalic and atraumatic.     Right Ear: External ear normal.     Left Ear: External ear normal.  Eyes:     General: Lids are normal. No scleral icterus.       Right eye: No discharge.        Left eye: No discharge.     Conjunctiva/sclera: Conjunctivae normal.  Neck:     Musculoskeletal: Normal range of motion and neck supple.     Trachea: Phonation normal. No tracheal deviation.  Cardiovascular:     Rate and Rhythm: Normal rate. Rhythm irregular.     Pulses: Normal pulses.          Radial pulses are 2+ on the right side and 2+ on the left side.       Posterior tibial pulses are 2+ on the right side and 2+ on the left side.     Heart sounds: Normal heart sounds. No murmur. No friction rub. No gallop.   Pulmonary:     Effort: Pulmonary effort is normal. No respiratory distress.     Breath sounds: Normal breath sounds. No stridor. No wheezing, rhonchi or rales.  Chest:     Chest wall: No tenderness.  Abdominal:     General: Bowel sounds are normal. There is no distension.     Palpations: Abdomen is soft.     Tenderness: There is no abdominal tenderness. There is no guarding or rebound.  Musculoskeletal: Normal range of motion.        General: No deformity.     Right lower leg: No edema.     Left lower leg: No edema.  Lymphadenopathy:     Cervical: No cervical adenopathy.  Skin:    General: Skin is warm and dry.     Coloration: Skin is not jaundiced or pale.     Findings: No rash.  Neurological:     Mental Status: She is alert.     Sensory: No sensory deficit.  Motor: No weakness or abnormal muscle tone.     Coordination: Coordination normal.     Gait: Gait normal.  Psychiatric:        Speech: Speech normal.        Behavior: Behavior normal.     Comments: Slightly anxious mood         PHQ2/9: Depression screen [facility] 2/9 01/24/2019 12/18/2018 09/17/2018 05/14/2018 03/26/2018  Decreased Interest 0  0 0 0 0  Down, Depressed, Hopeless 0 0 0 0 0  PHQ - 2 Score 0 0 0 0 0  Altered sleeping 0 0 0 0 0  Tired, decreased energy 0 0 0 0 0  Change in appetite 0 0 0 0 0  Feeling bad or failure about yourself  0 0 0 0 0  Trouble concentrating 0 0 0 0 0  Moving slowly or fidgety/restless 0 0 0 0 0  Suicidal thoughts 0 0 0 0 0  PHQ-9 Score 0 0 0 0 0  Difficult doing work/chores Not difficult at all Somewhat difficult Not difficult at all Not difficult at all Not difficult at all  Some recent data might be hidden    phq 9 is negative reviewed  Fall Risk: Fall Risk  01/24/2019 12/18/2018 09/17/2018 05/14/2018 03/26/2018  Falls in the past year? 0 0 0 0 0  Number falls in past yr: 0 0 0 0 -  Injury with Fall? 0 0 0 0 -  Risk for fall due to : - - - - -  Risk for fall due to: Comment - - - - -      Functional Status Survey: Is the patient deaf or have difficulty hearing?: No Does the patient have difficulty seeing, even when wearing glasses/contacts?: No Does the patient have difficulty concentrating, remembering, or making decisions?: No Does the patient have difficulty walking or climbing stairs?: No Does the patient have difficulty dressing or bathing?: No Does the patient have difficulty doing errands alone such as visiting a doctor's office or shopping?: No    Assessment & Plan:     ICD-10-CM   1. Essential hypertension  99991111 COMPLETE METABOLIC PANEL WITH GFR   BP was elevated at a procedure and at home, normal here due for labs no med changes  2. Hypothyroidism, unspecified type  E03.9 TSH   recheck TSH  3. Dyslipidemia  99991111 COMPLETE METABOLIC PANEL WITH GFR    Lipid panel   elevated, but no meds and tx with her past PCP, recheck lipid panel today  4. Need for influenza vaccination  Z23 Flu Vaccine QUAD High Dose(Fluad)  5. Chronic abdominal pain (LLQ) (Primary Area of Pain) (Left)  R10.32    G89.29   6. Chronic pain syndrome  G89.4    mid to low back pain with left hip and  leg pain - chronic issue, managed by pain medicine - encouraged her to go back to MD, or let me refer her elsewhere     Return in about 6 months (around 07/25/2019) for Routine follow-up.   Delsa Grana, PA-C 01/24/19 2:50 PM

## 2019-01-24 NOTE — Patient Instructions (Signed)
Please follow up with Dr. Dossie Arbour for your back and left hip/leg pain issues.  Let me know if you need a different referral.

## 2019-01-25 LAB — COMPLETE METABOLIC PANEL WITH GFR
AG Ratio: 1.9 (calc) (ref 1.0–2.5)
ALT: 19 U/L (ref 6–29)
AST: 19 U/L (ref 10–35)
Albumin: 4.6 g/dL (ref 3.6–5.1)
Alkaline phosphatase (APISO): 69 U/L (ref 37–153)
BUN: 25 mg/dL (ref 7–25)
CO2: 27 mmol/L (ref 20–32)
Calcium: 10.2 mg/dL (ref 8.6–10.4)
Chloride: 103 mmol/L (ref 98–110)
Creat: 0.83 mg/dL (ref 0.60–0.93)
GFR, Est African American: 80 mL/min/{1.73_m2} (ref >=60)
GFR, Est Non African American: 69 mL/min/{1.73_m2} (ref >=60)
Globulin: 2.4 g/dL (calc) (ref 1.9–3.7)
Glucose, Bld: 96 mg/dL (ref 65–99)
Potassium: 4.4 mmol/L (ref 3.5–5.3)
Sodium: 138 mmol/L (ref 135–146)
Total Bilirubin: 0.7 mg/dL (ref 0.2–1.2)
Total Protein: 7 g/dL (ref 6.1–8.1)

## 2019-01-25 LAB — LIPID PANEL
Cholesterol: 207 mg/dL — ABNORMAL HIGH (ref <200)
HDL: 73 mg/dL (ref >=50)
LDL Cholesterol (Calc): 119 mg/dL (calc) — ABNORMAL HIGH
Non-HDL Cholesterol (Calc): 134 mg/dL (calc) — ABNORMAL HIGH (ref <130)
Total CHOL/HDL Ratio: 2.8 (calc) (ref <5.0)
Triglycerides: 61 mg/dL (ref <150)

## 2019-01-25 LAB — TSH: TSH: 2.95 mIU/L (ref 0.40–4.50)

## 2019-01-27 MED ORDER — ROSUVASTATIN CALCIUM 10 MG PO TABS: 10.0000 mg | ORAL_TABLET | Freq: Every day | ORAL | 3 refills | Status: DC

## 2019-01-27 NOTE — Addendum Note (Signed)
Addended by: Delsa Grana on: 01/27/2019 01:15 PM   Modules accepted: Orders

## 2019-02-04 ENCOUNTER — Other Ambulatory Visit: Payer: Self-pay

## 2019-02-04 ENCOUNTER — Other Ambulatory Visit
Admission: RE | Admit: 2019-02-04 | Discharge: 2019-02-04 | Disposition: A | Discharge status: Home / Self Care | Payer: Medicare Other | Source: Ambulatory Visit | Attending: Ophthalmology | Admitting: Ophthalmology

## 2019-02-04 DIAGNOSIS — Z20828 Contact with and (suspected) exposure to other viral communicable diseases: Secondary | ICD-10-CM | POA: Diagnosis not present

## 2019-02-04 DIAGNOSIS — Z01812 Encounter for preprocedural laboratory examination: Secondary | ICD-10-CM | POA: Diagnosis present

## 2019-02-04 LAB — SARS CORONAVIRUS 2 (TAT 6-24 HRS): SARS Coronavirus 2: NEGATIVE

## 2019-02-06 ENCOUNTER — Encounter: Payer: Self-pay | Admitting: Anesthesiology

## 2019-02-07 ENCOUNTER — Ambulatory Visit: Payer: Medicare Other | Admitting: Anesthesiology

## 2019-02-07 ENCOUNTER — Encounter
Admission: RE | Disposition: A | Discharge status: Home / Self Care | Payer: Self-pay | Source: Ambulatory Visit | Attending: Ophthalmology

## 2019-02-07 ENCOUNTER — Encounter: Payer: Self-pay | Admitting: *Deleted

## 2019-02-07 ENCOUNTER — Ambulatory Visit
Admission: RE | Admit: 2019-02-07 | Discharge: 2019-02-07 | Disposition: A | Discharge status: Home / Self Care | Payer: Medicare Other | Source: Ambulatory Visit | Attending: Ophthalmology | Admitting: Ophthalmology

## 2019-02-07 DIAGNOSIS — I10 Essential (primary) hypertension: Secondary | ICD-10-CM | POA: Insufficient documentation

## 2019-02-07 DIAGNOSIS — H9190 Unspecified hearing loss, unspecified ear: Secondary | ICD-10-CM | POA: Insufficient documentation

## 2019-02-07 DIAGNOSIS — I480 Paroxysmal atrial fibrillation: Secondary | ICD-10-CM | POA: Insufficient documentation

## 2019-02-07 DIAGNOSIS — H2512 Age-related nuclear cataract, left eye: Secondary | ICD-10-CM | POA: Diagnosis present

## 2019-02-07 DIAGNOSIS — Z79890 Hormone replacement therapy (postmenopausal): Secondary | ICD-10-CM | POA: Insufficient documentation

## 2019-02-07 DIAGNOSIS — Z79899 Other long term (current) drug therapy: Secondary | ICD-10-CM | POA: Insufficient documentation

## 2019-02-07 DIAGNOSIS — Z7982 Long term (current) use of aspirin: Secondary | ICD-10-CM | POA: Diagnosis not present

## 2019-02-07 DIAGNOSIS — E039 Hypothyroidism, unspecified: Secondary | ICD-10-CM | POA: Diagnosis not present

## 2019-02-07 DIAGNOSIS — E669 Obesity, unspecified: Secondary | ICD-10-CM | POA: Diagnosis not present

## 2019-02-07 DIAGNOSIS — Z6831 Body mass index (BMI) 31.0-31.9, adult: Secondary | ICD-10-CM | POA: Diagnosis not present

## 2019-02-07 HISTORY — PX: CATARACT EXTRACTION W/PHACO: SHX586

## 2019-02-07 SURGERY — PHACOEMULSIFICATION, CATARACT, WITH IOL INSERTION
Anesthesia: Monitor Anesthesia Care | Site: Eye | Laterality: Left

## 2019-02-07 MED ORDER — ONDANSETRON HCL 4 MG/2ML IJ SOLN
INTRAMUSCULAR | Status: AC
  Filled 2019-02-07: qty 2

## 2019-02-07 MED ORDER — MIDAZOLAM HCL 2 MG/2ML IJ SOLN
INTRAMUSCULAR | Status: AC
  Filled 2019-02-07: qty 2

## 2019-02-07 MED ORDER — EPINEPHRINE PF 1 MG/ML IJ SOLN
INTRAOCULAR | Status: DC | PRN
  Administered 2019-02-07: 10:00:00 via OPHTHALMIC

## 2019-02-07 MED ORDER — POVIDONE-IODINE 5 % OP SOLN
OPHTHALMIC | Status: DC | PRN
  Administered 2019-02-07: 1 via OPHTHALMIC

## 2019-02-07 MED ORDER — CARBACHOL 0.01 % IO SOLN
INTRAOCULAR | Status: DC | PRN
  Administered 2019-02-07: 0.5 mL via INTRAOCULAR

## 2019-02-07 MED ORDER — FENTANYL CITRATE (PF) 100 MCG/2ML IJ SOLN
INTRAMUSCULAR | Status: AC
  Filled 2019-02-07: qty 2

## 2019-02-07 MED ORDER — MOXIFLOXACIN HCL 0.5 % OP SOLN
OPHTHALMIC | Status: DC | PRN
  Administered 2019-02-07: 0.2 mL via OPHTHALMIC

## 2019-02-07 MED ORDER — NA CHONDROIT SULF-NA HYALURON 40-17 MG/ML IO SOLN
INTRAOCULAR | Status: DC | PRN
  Administered 2019-02-07: 1 mL via INTRAOCULAR

## 2019-02-07 MED ORDER — ONDANSETRON HCL 4 MG/2ML IJ SOLN
INTRAMUSCULAR | Status: DC | PRN
  Administered 2019-02-07: 4 mg via INTRAVENOUS

## 2019-02-07 MED ORDER — ARMC OPHTHALMIC DILATING DROPS
1.0000 "application " | OPHTHALMIC | Status: AC
  Administered 2019-02-07 (×3): 1 via OPHTHALMIC

## 2019-02-07 MED ORDER — ARMC OPHTHALMIC DILATING DROPS
OPHTHALMIC | Status: AC
  Administered 2019-02-07: 1 via OPHTHALMIC
  Filled 2019-02-07: qty 0.5

## 2019-02-07 MED ORDER — MIDAZOLAM HCL 2 MG/2ML IJ SOLN
INTRAMUSCULAR | Status: DC | PRN
  Administered 2019-02-07: 1 mg via INTRAVENOUS
  Administered 2019-02-07 (×2): 0.5 mg via INTRAVENOUS

## 2019-02-07 MED ORDER — TETRACAINE HCL 0.5 % OP SOLN
OPHTHALMIC | Status: AC
  Administered 2019-02-07 (×2): 1 [drp]
  Filled 2019-02-07: qty 4

## 2019-02-07 MED ORDER — SODIUM CHLORIDE 0.9 % IV SOLN
INTRAVENOUS | Status: DC
  Administered 2019-02-07 (×2): via INTRAVENOUS

## 2019-02-07 MED ORDER — MOXIFLOXACIN HCL 0.5 % OP SOLN: 1.0000 [drp] | OPHTHALMIC | Status: DC | PRN

## 2019-02-07 MED ORDER — LIDOCAINE HCL (PF) 4 % IJ SOLN
INTRAOCULAR | Status: DC | PRN
  Administered 2019-02-07: 10:00:00 4 mL via OPHTHALMIC

## 2019-02-07 MED ORDER — MOXIFLOXACIN HCL 0.5 % OP SOLN
OPHTHALMIC | Status: AC
  Filled 2019-02-07: qty 3

## 2019-02-07 MED ORDER — TETRACAINE HCL 0.5 % OP SOLN: 1.0000 [drp] | Freq: Two times a day (BID) | OPHTHALMIC | Status: DC

## 2019-02-07 SURGICAL SUPPLY — 16 items

## 2019-02-07 NOTE — Op Note (Signed)
PREOPERATIVE DIAGNOSIS:  Nuclear sclerotic cataract of the left eye.   POSTOPERATIVE DIAGNOSIS:  Nuclear sclerotic cataract of the left eye.   OPERATIVE PROCEDURE: Procedure(s): CATARACT EXTRACTION PHACO AND INTRAOCULAR LENS PLACEMENT (IOC) LEFT   SURGEON:  Birder Robson, MD.   ANESTHESIA:  Anesthesiologist: Gunnar Bulla, MD CRNA: Kelton Pillar, CRNA  1.      Managed anesthesia care. 2.     0.9ml of Shugarcaine was instilled following the paracentesis   COMPLICATIONS:  None.   TECHNIQUE:   Stop and chop   DESCRIPTION OF PROCEDURE:  The patient was examined and consented in the preoperative holding area where the aforementioned topical anesthesia was applied to the left eye and then brought back to the Operating Room where the left eye was prepped and draped in the usual sterile ophthalmic fashion and a lid speculum was placed. A paracentesis was created with the side port blade and the anterior chamber was filled with viscoelastic. A near clear corneal incision was performed with the steel keratome. A continuous curvilinear capsulorrhexis was performed with a cystotome followed by the capsulorrhexis forceps. Hydrodissection and hydrodelineation were carried out with BSS on a blunt cannula. The lens was removed in a stop and chop  technique and the remaining cortical material was removed with the irrigation-aspiration handpiece. The capsular bag was inflated with viscoelastic and the Technis [id number] lens was placed in the capsular bag without complication. The remaining viscoelastic was removed from the eye with the irrigation-aspiration handpiece. The wounds were hydrated. The anterior chamber was flushed with Miostat and the eye was inflated to physiologic pressure. 0.72ml Vigamox was placed in the anterior chamber. The wounds were found to be water tight. The eye was dressed with Vigamox. The patient was given protective glasses to wear throughout the day and a shield with which  to sleep tonight. The patient was also given drops with which to begin a drop regimen today and will follow-up with me in one day. Implant Name Type Inv. Item Serial No. Manufacturer Lot No. LRB No. Used Action  LENS IOL DIOP 21.0 - [id number] Intraocular Lens LENS IOL DIOP 21.0 [id number] [place]  Left 1 Implanted    Procedure(s) with comments: CATARACT EXTRACTION PHACO AND INTRAOCULAR LENS PLACEMENT (IOC) LEFT (Left) - [place] 00:36.3 CDE 5.01 Fluid Pack Lot # [id number]  Electronically signed: Birder Robson 02/07/2019 10:36 AM

## 2019-02-07 NOTE — Discharge Instructions (Addendum)
Eye Surgery Discharge Instructions  Expect mild scratchy sensation or mild soreness. DO NOT RUB YOUR EYE!  The day of surgery:  Minimal physical activity, but bed rest is not required  No reading, computer work, or close hand work  No bending, lifting, or straining.  May watch TV  For 24 hours:  No driving, legal decisions, or alcoholic beverages  Safety precautions  Eat anything you prefer: It is better to start with liquids, then soup then solid foods.  Solar shield eyeglasses should be worn for comfort in the sunlight/patch while sleeping  Resume all regular medications including aspirin or Coumadin if these were discontinued prior to surgery. You may shower, bathe, shave, or wash your hair. Tylenol may be taken for mild discomfort. Follow eye drop instruction sheet as reviewed.  Call your doctor if you experience significant pain, nausea, or vomiting, fever > 101 or other signs of infection. [phone] or [phone] Specific instructions:  Follow-up Information    Birder Robson, MD Follow up.   Specialty: Ophthalmology Why: 02-07-19 @ 2:50 pm Contact information: [address] [place] [address] [phone]

## 2019-02-07 NOTE — Transfer of Care (Signed)
Immediate Anesthesia Transfer of Care Note  Patient: Rebecca Anthony  Procedure(s) Performed: CATARACT EXTRACTION PHACO AND INTRAOCULAR LENS PLACEMENT (IOC) LEFT (Left Eye)  Patient Location: PACU  Anesthesia Type:MAC  Level of Consciousness: awake, alert , oriented and patient cooperative  Airway & Oxygen Therapy: Patient Spontanous Breathing  Post-op Assessment: Report given to RN and Post -op Vital signs reviewed and stable  Post vital signs: Reviewed and stable  Last Vitals:  Vitals Value Taken Time  BP    Temp    Pulse    Resp    SpO2      Last Pain:  Vitals:   02/07/19 0810  TempSrc: Temporal  PainSc: 0-No pain         Complications: No apparent anesthesia complications

## 2019-02-07 NOTE — Anesthesia Postprocedure Evaluation (Signed)
Anesthesia Post Note  Patient: Rebecca Anthony  Procedure(s) Performed: CATARACT EXTRACTION PHACO AND INTRAOCULAR LENS PLACEMENT (IOC) LEFT (Left Eye)  Patient location during evaluation: Phase II Anesthesia Type: MAC Level of consciousness: awake, oriented, awake and alert and patient cooperative Pain management: pain level controlled Vital Signs Assessment: post-procedure vital signs reviewed and stable Respiratory status: spontaneous breathing Cardiovascular status: stable Postop Assessment: no apparent nausea or vomiting and able to ambulate Anesthetic complications: no     Last Vitals:  Vitals:   02/07/19 0810  BP: (!) 161/92  Pulse: 89  Resp: 16  Temp: 36.6 C  SpO2: 99%    Last Pain:  Vitals:   02/07/19 0810  TempSrc: Temporal  PainSc: 0-No pain                 Asherah Lavoy Fletcher-Harrison

## 2019-02-07 NOTE — H&P (Signed)
All labs reviewed. Abnormal studies sent to patients PCP when indicated.  Previous H&P reviewed, patient examined, there are NO CHANGES.  Rebecca Parkhill Porfilio10/23/202010:07 AM

## 2019-02-07 NOTE — Anesthesia Preprocedure Evaluation (Signed)
Anesthesia Evaluation  Patient identified by MRN, date of birth, ID band Patient awake    Reviewed: Allergy & Precautions, NPO status , Patient's Chart, lab work & pertinent test results, reviewed documented beta blocker date and time   Airway Mallampati: II  TM Distance: >3 FB     Dental  (+) Chipped   Pulmonary           Cardiovascular hypertension, Pt. on medications and Pt. on home beta blockers + dysrhythmias Atrial Fibrillation + Valvular Problems/Murmurs      Neuro/Psych    GI/Hepatic   Endo/Other  Hypothyroidism   Renal/GU      Musculoskeletal  (+) Arthritis ,   Abdominal   Peds  Hematology   Anesthesia Other Findings   Reproductive/Obstetrics                             Anesthesia Physical Anesthesia Plan  ASA: III  Anesthesia Plan: MAC   Post-op Pain Management:    Induction: Intravenous  PONV Risk Score and Plan:   Airway Management Planned:   Additional Equipment:   Intra-op Plan:   Post-operative Plan:   Informed Consent: I have reviewed the patients History and Physical, chart, labs and discussed the procedure including the risks, benefits and alternatives for the proposed anesthesia with the patient or authorized representative who has indicated his/her understanding and acceptance.       Plan Discussed with: CRNA  Anesthesia Plan Comments:         Anesthesia Quick Evaluation

## 2019-02-07 NOTE — Anesthesia Post-op Follow-up Note (Signed)
Anesthesia QCDR form completed.        

## 2019-02-13 ENCOUNTER — Other Ambulatory Visit: Payer: Self-pay

## 2019-02-13 ENCOUNTER — Ambulatory Visit (INDEPENDENT_AMBULATORY_CARE_PROVIDER_SITE_OTHER): Payer: Medicare Other

## 2019-02-13 VITALS — Ht 69.0 in | Wt 212.0 lb

## 2019-02-13 DIAGNOSIS — Z0000 Encounter for general adult medical examination without abnormal findings: Secondary | ICD-10-CM | POA: Diagnosis not present

## 2019-02-13 DIAGNOSIS — Z1231 Encounter for screening mammogram for malignant neoplasm of breast: Secondary | ICD-10-CM | POA: Diagnosis not present

## 2019-02-13 NOTE — Patient Instructions (Signed)
Rebecca Anthony , Thank you for taking time to come for your Medicare Wellness Visit. I appreciate your ongoing commitment to your health goals. Please review the following plan we discussed and let me know if I can assist you in the future.   Screening recommendations/referrals: Colonoscopy: Cologuard done 10/26/16. Repeat in 2021. Mammogram: done 03/25/18. Please call [phone] to schedule your mammogram.  Bone Density: done 04/16/18 Recommended yearly ophthalmology/optometry visit for glaucoma screening and checkup Recommended yearly dental visit for hygiene and checkup  Vaccinations: Influenza vaccine: done 01/24/19 Pneumococcal vaccine: done 01/20/16 Tdap vaccine: don 05/24/10 Shingles vaccine: Shingrix discussed. Please contact your pharmacy for coverage information.   Advanced directives: Please bring a copy of your health care power of attorney and living will to the office at your convenience.  Conditions/risks identified: Recommend increasing physical activity   Next appointment: Please follow up in one year for your Medicare Annual Wellness visit.     Preventive Care 75 Years and Older, Female Preventive care refers to lifestyle choices and visits with your health care provider that can promote health and wellness. What does preventive care include?  A yearly physical exam. This is also called an annual well check.  Dental exams once or twice a year.  Routine eye exams. Ask your health care provider how often you should have your eyes checked.  Personal lifestyle choices, including:  Daily care of your teeth and gums.  Regular physical activity.  Eating a healthy diet.  Avoiding tobacco and drug use.  Limiting alcohol use.  Practicing safe sex.  Taking low-dose aspirin every day.  Taking vitamin and mineral supplements as recommended by your health care provider. What happens during an annual well check? The services and screenings done by your health care  provider during your annual well check will depend on your age, overall health, lifestyle risk factors, and family history of disease. Counseling  Your health care provider may ask you questions about your:  Alcohol use.  Tobacco use.  Drug use.  Emotional well-being.  Home and relationship well-being.  Sexual activity.  Eating habits.  History of falls.  Memory and ability to understand (cognition).  Work and work Statistician.  Reproductive health. Screening  You may have the following tests or measurements:  Height, weight, and BMI.  Blood pressure.  Lipid and cholesterol levels. These may be checked every 5 years, or more frequently if you are over 52 years old.  Skin check.  Lung cancer screening. You may have this screening every year starting at age 75 if you have a 30-pack-year history of smoking and currently smoke or have quit within the past 15 years.  Fecal occult blood test (FOBT) of the stool. You may have this test every year starting at age 75.  Flexible sigmoidoscopy or colonoscopy. You may have a sigmoidoscopy every 5 years or a colonoscopy every 10 years starting at age 75.  Hepatitis C blood test.  Hepatitis B blood test.  Sexually transmitted disease (STD) testing.  Diabetes screening. This is done by checking your blood sugar (glucose) after you have not eaten for a while (fasting). You may have this done every 1-3 years.  Bone density scan. This is done to screen for osteoporosis. You may have this done starting at age 75.  Mammogram. This may be done every 1-2 years. Talk to your health care provider about how often you should have regular mammograms. Talk with your health care provider about your test results, treatment options, and if  necessary, the need for more tests. Vaccines  Your health care provider may recommend certain vaccines, such as:  Influenza vaccine. This is recommended every year.  Tetanus, diphtheria, and acellular  pertussis (Tdap, Td) vaccine. You may need a Td booster every 10 years.  Zoster vaccine. You may need this after age 75.  Pneumococcal 13-valent conjugate (PCV13) vaccine. One dose is recommended after age 75.  Pneumococcal polysaccharide (PPSV23) vaccine. One dose is recommended after age 75. Talk to your health care provider about which screenings and vaccines you need and how often you need them. This information is not intended to replace advice given to you by your health care provider. Make sure you discuss any questions you have with your health care provider. Document Released: 04/30/2015 Document Revised: 12/22/2015 Document Reviewed: 02/02/2015 Elsevier Interactive Patient Education  2017 [employer] Prevention in the Home Falls can cause injuries. They can happen to people of all ages. There are many things you can do to make your home safe and to help prevent falls. What can I do on the outside of my home?  Regularly fix the edges of walkways and driveways and fix any cracks.  Remove anything that might make you trip as you walk through a door, such as a raised step or threshold.  Trim any bushes or trees on the path to your home.  Use bright outdoor lighting.  Clear any walking paths of anything that might make someone trip, such as rocks or tools.  Regularly check to see if handrails are loose or broken. Make sure that both sides of any steps have handrails.  Any raised decks and porches should have guardrails on the edges.  Have any leaves, snow, or ice cleared regularly.  Use sand or salt on walking paths during winter.  Clean up any spills in your garage right away. This includes oil or grease spills. What can I do in the bathroom?  Use night lights.  Install grab bars by the toilet and in the tub and shower. Do not use towel bars as grab bars.  Use non-skid mats or decals in the tub or shower.  If you need to sit down in the shower, use a plastic,  non-slip stool.  Keep the floor dry. Clean up any water that spills on the floor as soon as it happens.  Remove soap buildup in the tub or shower regularly.  Attach bath mats securely with double-sided non-slip rug tape.  Do not have throw rugs and other things on the floor that can make you trip. What can I do in the bedroom?  Use night lights.  Make sure that you have a light by your bed that is easy to reach.  Do not use any sheets or blankets that are too big for your bed. They should not hang down onto the floor.  Have a firm chair that has side arms. You can use this for support while you get dressed.  Do not have throw rugs and other things on the floor that can make you trip. What can I do in the kitchen?  Clean up any spills right away.  Avoid walking on wet floors.  Keep items that you use a lot in easy-to-reach places.  If you need to reach something above you, use a strong step stool that has a grab bar.  Keep electrical cords out of the way.  Do not use floor polish or wax that makes floors slippery. If you must  use wax, use non-skid floor wax.  Do not have throw rugs and other things on the floor that can make you trip. What can I do with my stairs?  Do not leave any items on the stairs.  Make sure that there are handrails on both sides of the stairs and use them. Fix handrails that are broken or loose. Make sure that handrails are as long as the stairways.  Check any carpeting to make sure that it is firmly attached to the stairs. Fix any carpet that is loose or worn.  Avoid having throw rugs at the top or bottom of the stairs. If you do have throw rugs, attach them to the floor with carpet tape.  Make sure that you have a light switch at the top of the stairs and the bottom of the stairs. If you do not have them, ask someone to add them for you. What else can I do to help prevent falls?  Wear shoes that:  Do not have high heels.  Have rubber  bottoms.  Are comfortable and fit you well.  Are closed at the toe. Do not wear sandals.  If you use a stepladder:  Make sure that it is fully opened. Do not climb a closed stepladder.  Make sure that both sides of the stepladder are locked into place.  Ask someone to hold it for you, if possible.  Clearly mark and make sure that you can see:  Any grab bars or handrails.  First and last steps.  Where the edge of each step is.  Use tools that help you move around (mobility aids) if they are needed. These include:  Canes.  Walkers.  Scooters.  Crutches.  Turn on the lights when you go into a dark area. Replace any light bulbs as soon as they burn out.  Set up your furniture so you have a clear path. Avoid moving your furniture around.  If any of your floors are uneven, fix them.  If there are any pets around you, be aware of where they are.  Review your medicines with your doctor. Some medicines can make you feel dizzy. This can increase your chance of falling. Ask your doctor what other things that you can do to help prevent falls. This information is not intended to replace advice given to you by your health care provider. Make sure you discuss any questions you have with your health care provider. Document Released: 01/28/2009 Document Revised: 09/09/2015 Document Reviewed: 05/08/2014 Elsevier Interactive Patient Education  2017 [employer].

## 2019-02-13 NOTE — Progress Notes (Signed)
Subjective:   Rebecca Anthony is a 73 y.o. female who presents for Medicare Annual (Subsequent) preventive examination.  Virtual Visit via Telephone Note  I connected with Rebecca Anthony on 02/13/19 at  3:30 PM EDT by telephone and verified that I am speaking with the correct person using two identifiers.  Medicare Annual Wellness visit completed telephonically due to Covid-19 pandemic.   Location: Patient: home Provider: office   I discussed the limitations, risks, security and privacy concerns of performing an evaluation and management service by telephone and the availability of in person appointments. The patient expressed understanding and agreed to proceed.  Some vital signs may be absent or patient reported.   [name], LPN    Review of Systems:   Cardiac Risk Factors include: dyslipidemia;obesity (BMI >30kg/m2)     Objective:     Vitals: Ht 5\' 9"  (1.753 m)   Wt 212 lb (96.2 kg)   BMI 31.31 kg/m   Body mass index is 31.31 kg/m.  Advanced Directives 02/13/2019 01/03/2018 02/14/2017 01/29/2017 09/06/2016 03/23/2016 11/25/2015  Does Patient Have a Medical Advance Directive? Yes Yes No No No No No  Type of Paramedic of [place];Living will [place];Living will - - - - -  Copy of [place] in Chart? No - copy requested No - copy requested - - - - -  Would patient like information on creating a medical advance directive? - - - - - - No - patient declined information    Tobacco Social History   Tobacco Use  Smoking Status Never Smoker  Smokeless Tobacco Never Used  Tobacco Comment   smoking cessation materials not required     Counseling given: Not Answered Comment: smoking cessation materials not required   Clinical Intake:  Pre-visit preparation completed: Yes  Pain : No/denies pain     BMI - recorded: 31.31 Nutritional Status: BMI > 30  Obese Nutritional Risks: None Diabetes: No   How often do you need to have someone help you when you read instructions, pamphlets, or other written materials from your doctor or pharmacy?: 1 - Never  Interpreter Needed?: No  Information entered by :: [name] LPN  Past Medical History:  Diagnosis Date  . Diverticulitis   . Diverticulosis   . Dysuria   . Heart murmur   . Hematuria, unspecified   . History of shingles   . History of swelling of feet   . Hypercholesterolemia   . Hypertension   . Hypothyroidism   . Irritable bowel syndrome   . Lichenification and lichen simplex chronicus   . Localized superficial swelling, mass, or lump   . Osteoarthrosis, unspecified whether generalized or localized, unspecified site   . Osteoporosis, unspecified   . PAF (paroxysmal atrial fibrillation) ([place])    Diagnosed years ago -- originally on Quinidine  . Sinus problem   . Synovial cyst, unspecified    Past Surgical History:  Procedure Laterality Date  . ABDOMINAL HYSTERECTOMY    . ANKLE SURGERY Left    Torned tendon  . APPENDECTOMY    . AV FISTULA REPAIR    . BUNIONECTOMY    . CATARACT EXTRACTION W/PHACO Right 01/02/2019   Procedure: CATARACT EXTRACTION PHACO AND INTRAOCULAR LENS PLACEMENT ([place]) RIGHT;  Surgeon: Birder Robson, MD;  Location: [facility] ORS;  Service: Ophthalmology;  Laterality: Right;  [place] 00:42 CDE 7.91 fluid pack lot # [id number]  . CATARACT EXTRACTION W/PHACO Left 02/07/2019   Procedure: CATARACT  EXTRACTION PHACO AND INTRAOCULAR LENS PLACEMENT ([place]) LEFT;  Surgeon: Birder Robson, MD;  Location: [facility] ORS;  Service: Ophthalmology;  Laterality: Left;  [place] 00:36.3CDE 5.01Fluid Pack Lot # [id number]  . COLONOSCOPY    . FOOT SURGERY Left   . VAGINAL HYSTERECTOMY    . VESICOVAGINAL FISTULA CLOSURE W/ TAH     Family History  Problem Relation Age of Onset  . Diabetes Mother   . Hypertension Mother   . Heart failure Mother   . Heart disease Father   . Hypertension Father   . Thyroid disease Sister   . Healthy  Sister   . Breast cancer Neg Hx    Social History   Socioeconomic History  . Marital status: Married    Spouse name: [name]  . Number of children: 1  . Years of education: Not on file  . Highest education level: Bachelor's degree (e.g., BA, AB, BS)  Occupational History  . Occupation: Retired  Scientific laboratory technician  . Financial resource strain: Not hard at all  . Food insecurity    Worry: Never true    Inability: Never true  . Transportation needs    Medical: No    Non-medical: No  Tobacco Use  . Smoking status: Never Smoker  . Smokeless tobacco: Never Used  . Tobacco comment: smoking cessation materials not required  Substance and Sexual Activity  . Alcohol use: No  . Drug use: No  . Sexual activity: Yes    Birth control/protection: None, Surgical  Lifestyle  . Physical activity    Days per week: 0 days    Minutes per session: 0 min  . Stress: Not at all  Relationships  . Social Herbalist on phone: Patient refused    Gets together: Patient refused    Attends religious service: Patient refused    Active member of club or organization: Patient refused    Attends meetings of clubs or organizations: Patient refused    Relationship status: Married  Other Topics Concern  . Not on file  Social History Narrative   Recently moved [facility] to [facility].    Outpatient Encounter Medications as of 02/13/2019  Medication Sig  . aspirin 81 MG tablet Take 1 tablet (81 mg total) by mouth daily. Do NOT take with Xarelto (just one or the other)  . brimonidine-timolol (COMBIGAN) 0.2-0.5 % ophthalmic solution Place 1 drop into the right eye every 12 (twelve) hours.  . Calcium Carb-Cholecalciferol (CALCIUM + D3 PO) Take 1 tablet by mouth daily.  Marland Kitchen HORSE CHESTNUT PO Take 1 tablet by mouth daily.  Marland Kitchen levothyroxine (SYNTHROID, LEVOTHROID) 25 MCG tablet Take 25 mcg by mouth daily before breakfast.   . metoprolol succinate (TOPROL-XL) 50 MG 24 hr tablet Take 1 tablet (50 mg total) by mouth  daily. Take with or immediately following a meal.  . polyethylene glycol powder (GLYCOLAX/MIRALAX) powder Take 17 g by mouth daily. Dissolve 1 capful in 8 oz of liquid daily PRN (Patient taking differently: Take 17 g by mouth daily as needed (constipation.). )  . vitamin C (ASCORBIC ACID) 500 MG tablet Take 500 mg by mouth daily.  . rosuvastatin (CRESTOR) 10 MG tablet Take 1 tablet (10 mg total) by mouth at bedtime. (Patient not taking: Reported on 01/27/2019)  . [DISCONTINUED] PRESCRIPTION MEDICATION Place 1 drop into the right eye 2 (two) times daily. Unknown eye drop   No facility-administered encounter medications on file as of 02/13/2019.     Activities of Daily  Living In your present state of health, do you have any difficulty performing the following activities: 02/13/2019 01/24/2019  Hearing? Y N  Comment declines hearing aids -  Vision? N N  Difficulty concentrating or making decisions? N N  Walking or climbing stairs? N N  Dressing or bathing? N N  Doing errands, shopping? N N  Preparing Food and eating ? N -  Using the Toilet? N -  In the past six months, have you accidently leaked urine? N -  Do you have problems with loss of bowel control? N -  Managing your Medications? N -  Managing your Finances? N -  Housekeeping or managing your Housekeeping? N -  Some recent data might be hidden    Patient Care Team: Delsa Grana, PA-C as PCP - General (Family Medicine) Birder Robson, MD as Consulting Physician (Ophthalmology) Renato Shin, MD as Consulting Physician (Endocrinology) Minna Merritts, MD as Consulting Physician (Cardiology) Carloyn Manner, MD as Consulting Physician (Otolaryngology) Jonathon Bellows, MD as Consulting Physician (Gastroenterology) Meade Maw, MD as Consulting Physician (Neurosurgery) Milinda Pointer, MD as Referring Physician (Pain Medicine)    Assessment:   This is a routine wellness examination for Rebecca Anthony.  Exercise Activities  and Dietary recommendations Current Exercise Habits: The patient does not participate in regular exercise at present, Exercise limited by: None identified  Goals    . DIET - REDUCE SODIUM INTAKE     Recommend to begin DASH diet (Diet attached to AVS).    . Increase physical activity     Recommend increasing physical activity to 150 minutes per week       Fall Risk Fall Risk  02/13/2019 01/24/2019 12/18/2018 09/17/2018 05/14/2018  Falls in the past year? 0 0 0 0 0  Number falls in past yr: 0 0 0 0 0  Injury with Fall? 0 0 0 0 0  Risk for fall due to : - - - - -  Risk for fall due to: Comment - - - - -  Follow up Falls prevention discussed - - - -   FALL RISK PREVENTION PERTAINING TO THE HOME:  Any stairs in or around the home? Yes  If so, do they handrails? Yes   Home free of loose throw rugs in walkways, pet beds, electrical cords, etc? Yes  Adequate lighting in your home to reduce risk of falls? Yes   ASSISTIVE DEVICES UTILIZED TO PREVENT FALLS:  Life alert? No  Use of a cane, walker or w/c? No  Grab bars in the bathroom? Yes  Shower chair or bench in shower? Yes  Elevated toilet seat or a handicapped toilet? Yes   DME ORDERS:  DME order needed?  No   TIMED UP AND GO:  Was the test performed? No . Telephonic visit.   Education: Fall risk prevention has been discussed.  Intervention(s) required? No    Depression Screen PHQ 2/9 Scores 02/13/2019 01/24/2019 12/18/2018 09/17/2018  PHQ - 2 Score 0 0 0 0  PHQ- 9 Score - 0 0 0     Cognitive Function     6CIT Screen 02/13/2019 01/03/2018  What Year? 0 points 0 points  What month? 0 points 0 points  What time? 0 points 0 points  Count back from 20 0 points 0 points  Months in reverse 0 points 0 points  Repeat phrase 0 points 0 points  Total Score 0 0    Immunization History  Administered Date(s) Administered  . Fluad Quad(high Dose 65+) 01/24/2019  .  Influenza, High Dose Seasonal PF 01/20/2016, 02/14/2017,  12/31/2017  . Pneumococcal Conjugate-13 01/20/2016  . Pneumococcal Polysaccharide-23 02/21/2010  . Tdap 05/24/2010  . Zoster 11/25/2015    Qualifies for Shingles Vaccine? Yes  Zostavax completed 2017. Due for Shingrix. Education has been provided regarding the importance of this vaccine. Pt has been advised to call insurance company to determine out of pocket expense. Advised may also receive vaccine at local pharmacy or Health Dept. Verbalized acceptance and understanding.  Tdap: Up to date  Flu Vaccine: Up to date  Pneumococcal Vaccine: Up to date   Screening Tests Health Maintenance  Topic Date Due  . COLON CANCER SCREENING ANNUAL FOBT  03/19/2019  . MAMMOGRAM  03/26/2019  . Fecal DNA (Cologuard)  10/27/2019  . TETANUS/TDAP  05/24/2020  . INFLUENZA VACCINE  Completed  . DEXA SCAN  Completed  . Hepatitis C Screening  Completed  . PNA vac Low Risk Adult  Completed    Cancer Screenings:  Colorectal Screening: Cologuard completed 10/26/16. Repeat every 3 years  Mammogram: Completed 03/25/18. Repeat every year; Ordered today. Pt provided with contact information and advised to call to schedule appt.   Bone Density: Completed 04/16/18. Results reflect  OSTEOPENIA. Repeat every 2 years.   Lung Cancer Screening: (Low Dose CT Chest recommended if Age 32-80 years, 30 pack-year currently smoking OR have quit w/in 15years.) does not qualify.   Additional Screening:  Hepatitis C Screening: does qualify; Completed 05/17/17  Vision Screening: Recommended annual ophthalmology exams for early detection of glaucoma and other disorders of the eye. Is the patient up to date with their annual eye exam?  Yes  Who is the provider or what is the name of the office in which the pt attends annual eye exams? [place] Screening: Recommended annual dental exams for proper oral hygiene  Community Resource Referral:  CRR required this visit?  No      Plan:     I have  personally reviewed and addressed the Medicare Annual Wellness questionnaire and have noted the following in the patient's chart:  A. Medical and social history B. Use of alcohol, tobacco or illicit drugs  C. Current medications and supplements D. Functional ability and status E.  Nutritional status F.  Physical activity G. Advance directives H. List of other physicians I.  Hospitalizations, surgeries, and ER visits in previous 12 months J.  [place] such as hearing and vision if needed, cognitive and depression L. Referrals and appointments   In addition, I have reviewed and discussed with patient certain preventive protocols, quality metrics, and best practice recommendations. A written personalized care plan for preventive services as well as general preventive health recommendations were provided to patient.   Signed,  [name], LPN Nurse Health Advisor   Nurse Notes: none

## 2019-03-31 IMAGING — US US THYROID
1 series · 14 of 25 positions shown · non-contrast
Comparison: None.

CLINICAL DATA: Goiter.  71-year-old female with thyroid nodule

EXAM:
THYROID ULTRASOUND
TECHNIQUE: Ultrasound examination of the thyroid gland and adjacent soft
tissues was performed.

[Series 1: us thyroid · 0.07mm/px · 14 of 82 slices shown]
[im 1/82]
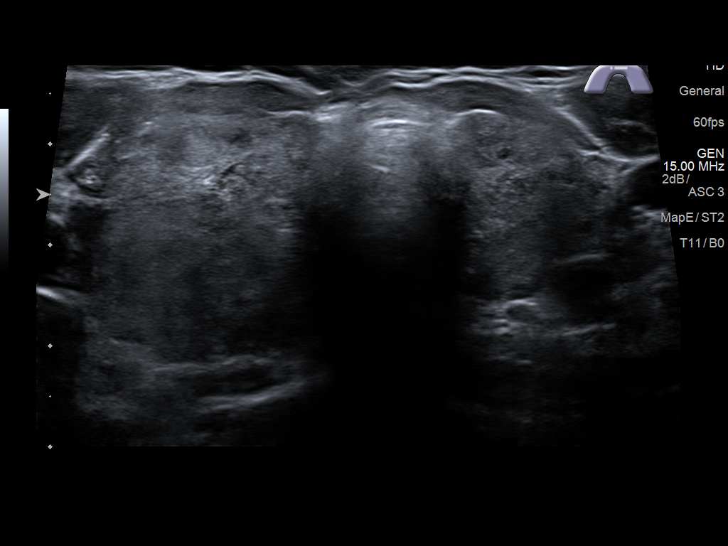
[im 7/82]
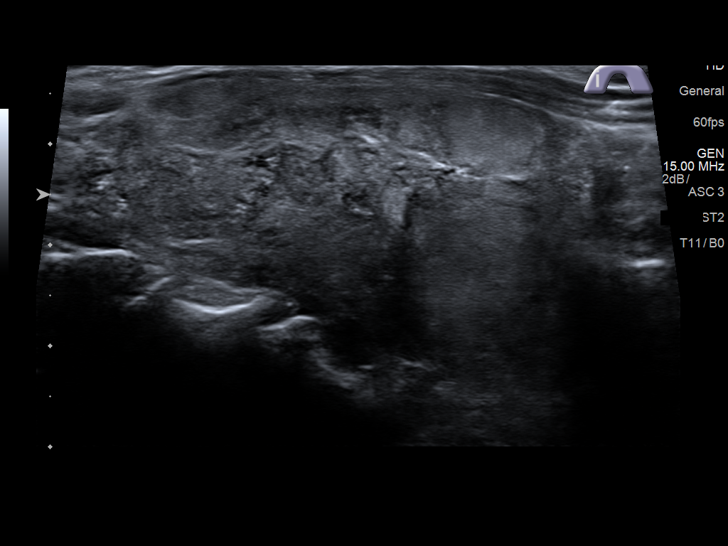
[im 14/82]
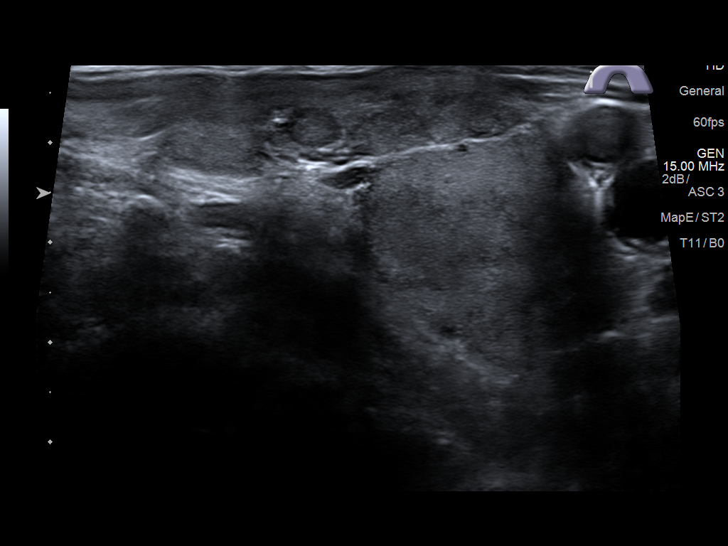
[im 21/82]
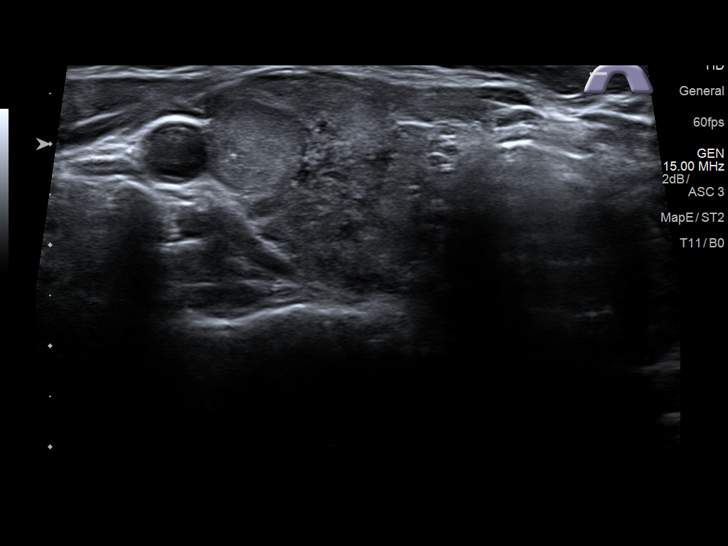
[im 28/82]
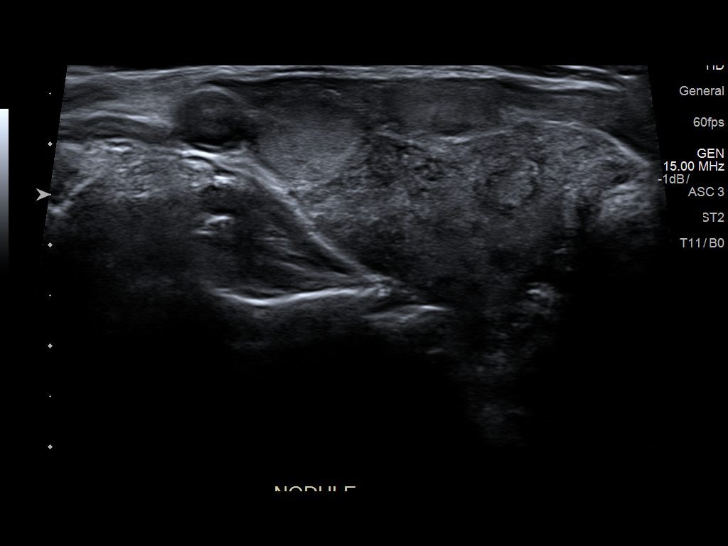
[im 31/82]
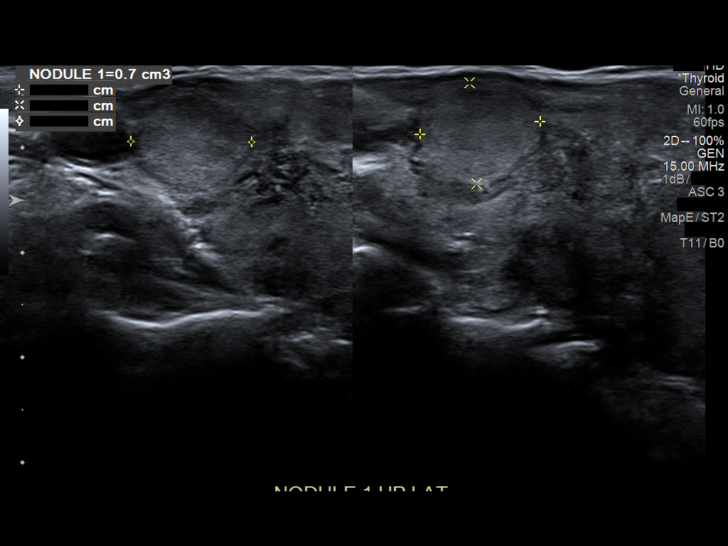
[im 38/82]
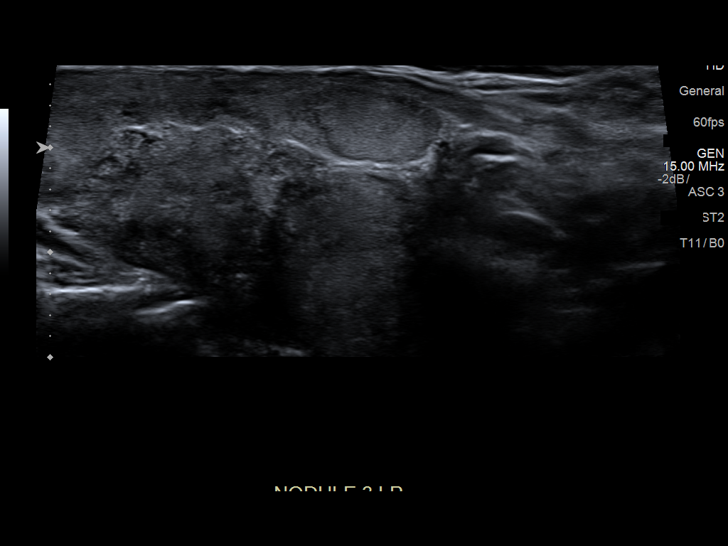
[im 44/82]
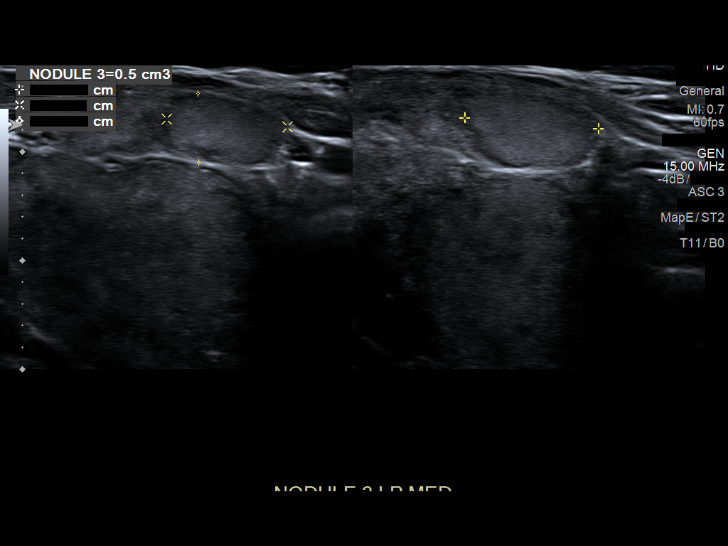
[im 51/82]
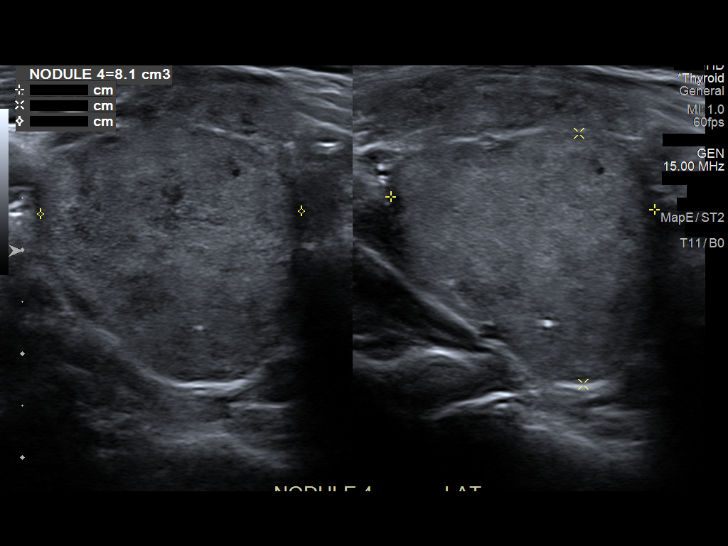
[im 55/82]
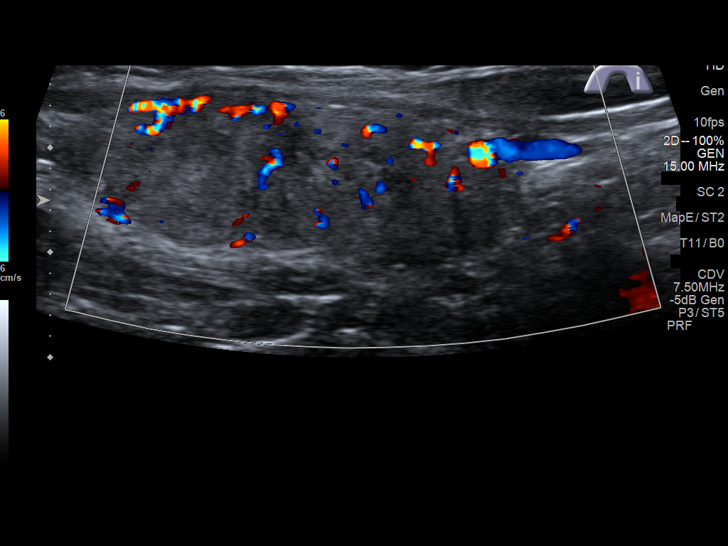
[im 61/82]
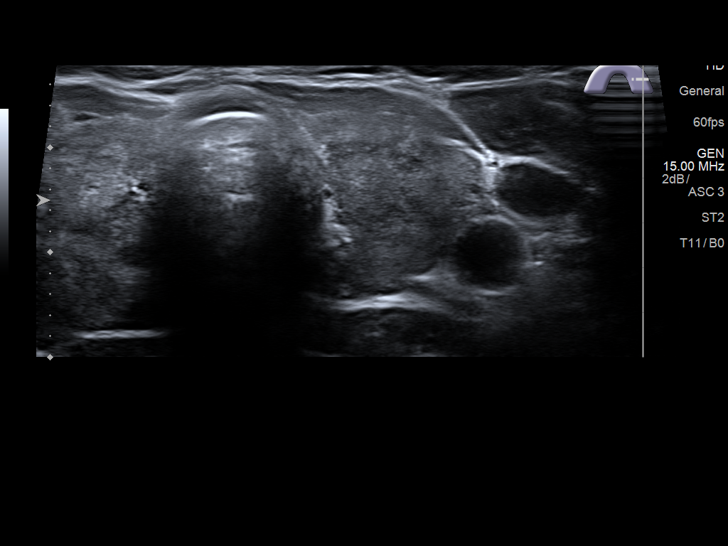
[im 68/82]
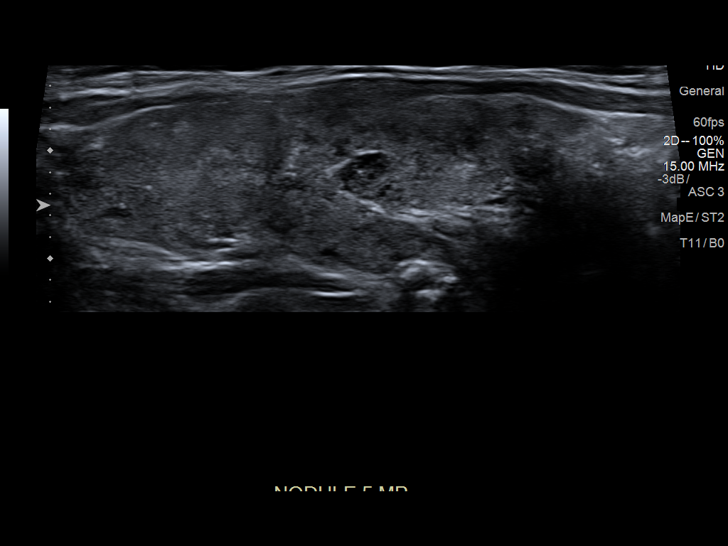
[im 75/82]
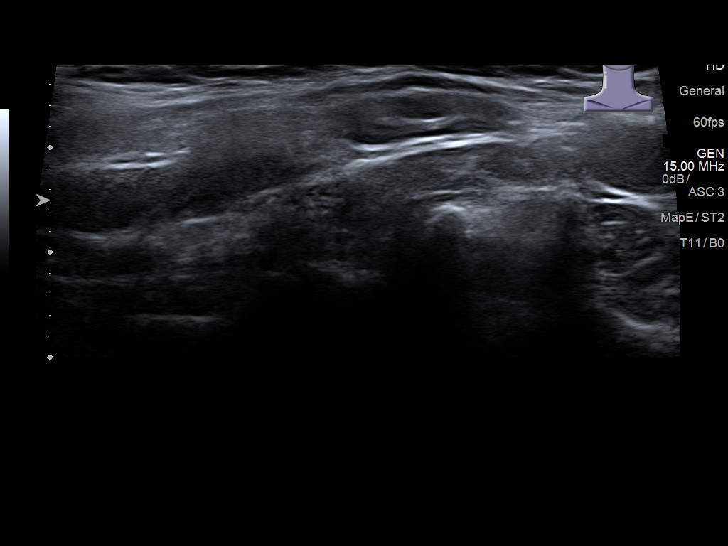
[im 82/82]
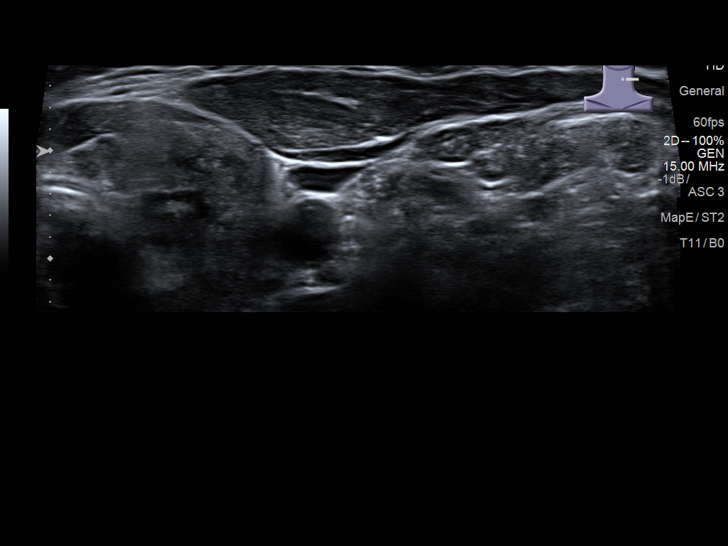

[14 of 25 positions shown; findings below may reference images not displayed]

FINDINGS: Parenchymal Echotexture: Markedly heterogenous

Isthmus: 2.2 cm

Right lobe: 5.2 x 2.7 x 2.5 cm

Left lobe: 5.1 x 1.8 x 1.4 cm

_________________________________________________________

Estimated total number of nodules >/= 1 cm: 0

Number of spongiform nodules >/=  2 cm not described below (TR1): 0

Number of mixed cystic and solid nodules >/= 1.5 cm not described
below (TR2): 0

_________________________________________________________

Diffusely enlarged, heterogeneous and lobular thyroid gland. There
are several regions of relatively nodularity which are isoechoic
with the adjacent thyroid parenchyma and strongly favored to
represent pseudo nodules. No definite hypoechoic or suspicious
thyroid nodules identified.
IMPRESSION: Markedly heterogeneous, enlarged and lobular thyroid gland with
multiple areas of pseudo nodularity. The overall appearance is most
consistent with diffuse goitrous change.

## 2019-04-23 ENCOUNTER — Ambulatory Visit: Payer: Medicare Other | Admitting: Family Medicine

## 2019-04-24 ENCOUNTER — Ambulatory Visit: Payer: Medicare Other | Admitting: Family Medicine

## 2019-04-29 ENCOUNTER — Other Ambulatory Visit: Payer: Self-pay

## 2019-04-29 ENCOUNTER — Ambulatory Visit: Payer: Medicare Other | Admitting: Family Medicine

## 2019-04-29 ENCOUNTER — Encounter: Payer: Self-pay | Admitting: Family Medicine

## 2019-04-29 VITALS — BP 148/92 | HR 77 | Temp 97.1°F | Resp 14 | Ht 69.0 in | Wt 214.5 lb

## 2019-04-29 DIAGNOSIS — M545 Low back pain, unspecified: Secondary | ICD-10-CM

## 2019-04-29 DIAGNOSIS — M25552 Pain in left hip: Secondary | ICD-10-CM

## 2019-04-29 DIAGNOSIS — M419 Scoliosis, unspecified: Secondary | ICD-10-CM

## 2019-04-29 MED ORDER — PREDNISONE 20 MG PO TABS: 40.0000 mg | ORAL_TABLET | Freq: Every day | ORAL | 0 refills | Status: DC

## 2019-04-29 MED ORDER — PREGABALIN 25 MG PO CAPS: 25.0000 mg | ORAL_CAPSULE | Freq: Two times a day (BID) | ORAL | 1 refills | Status: DC

## 2019-04-29 NOTE — Progress Notes (Signed)
[name], female    DOB: October 02, 1945, 74 y.o.   MRN: [id number]  PCP: Delsa Grana, PA-C  Chief Complaint  Patient presents with  . Hip Pain    left onset 2 weeks, worsening and hard time walking    Subjective:   Rebecca Anthony is a 74 y.o. female, presents to clinic with CC of the following:  Hip Pain  Incident onset: acute on chronic, used to be intermittent, last 2 weeks constant. There was no injury mechanism. The pain is present in the left hip. The quality of the pain is described as aching (nagging achy sharp, from left hip and groin radiates down leg). The pain is at a severity of 10/10. The pain is severe. The pain has been worsening since onset. Pertinent negatives include no inability to bear weight or loss of motion. The symptoms are aggravated by movement and weight bearing (certain movements). She has tried heat, NSAIDs and rest for the symptoms. The treatment provided mild relief.     Last neurosurgeon was the last visit she had she cannot remember Pain management she hasn't been to in a several months.  Doesn't want to go back to, she said this last time she was here.  She also wants to talk about back pain and left hip pain and sciatica which she believes she has.  Upon reviewing the chart she has long history of chronic pain and degenerative disks, has seen pain management specialist, was due for procedures to treat the pain that she is currently complaining about.  She states that she has daily left hip pain near groin that radiates down leg, always hurts more at night, taking advil and old muscle relaxers that just knock her out.  Pain is severe.  She has seen neuro surgeon before -she cannot remember his name, she has hx of thoracic disc herniation, chronic back pain.  When discussing options with her about physical medicine, neurosurgery, rehab (PT) she says that she will go back to her pain management doctor but then also changes her mind  Pain is  worse with sitting for any period of time, goes from left low back to left hip and down legs  Patient Active Problem List   Diagnosis Date Noted  . Dyslipidemia 05/30/2018  . Prediabetes 05/14/2018  . Thyroid nodule 01/28/2018  . Gallbladder polyp 01/28/2018  . Cyst of pancreas 01/28/2018  . 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 Foraminal stenosis of thoracic region 01/28/2018  . Idiopathic scoliosis of thoracolumbar spine 11/28/2017  . Sigmoid diverticulosis 11/28/2017  . Chronic ankle pain (Fourth Area of Pain) (Left) 11/07/2017  . Chronic abdominal pain (LLQ) (Primary Area of Pain) (Left) 10/31/2017  . Chronic low back pain (Secondary Area of Pain) (Bilateral) (L>R) 10/31/2017  . Chronic pain syndrome 10/31/2017  . Chronic upper back pain [facility] Area of Pain) (Bilateral) (L>R) 10/31/2017  . Obesity (BMI 30.0-34.9) 09/13/2017  . Hematuria, microscopic 07/16/2017  . Hypothyroidism 02/23/2017  . LLQ abdominal pain 09/08/2016  . Osteopenia of left femoral neck 03/23/2016  . Onychomycosis 03/23/2016  . Abnormal EKG 08/29/2013  . PAF (paroxysmal atrial fibrillation) ([place])   . Hypertension       Current Outpatient Medications:  .  aspirin 81 MG tablet, Take 1 tablet (81 mg total) by mouth daily. Do NOT take with Xarelto (just one or the other), Disp: 30 tablet, Rfl:  .  Calcium Carb-Cholecalciferol (CALCIUM + D3 PO), Take 1 tablet by mouth daily.,  Disp: , Rfl:  .  HORSE CHESTNUT PO, Take 1 tablet by mouth daily., Disp: , Rfl:  .  ibuprofen (ADVIL) 800 MG tablet, Take 800 mg by mouth every 6 (six) hours as needed., Disp: , Rfl:  .  levothyroxine (SYNTHROID, LEVOTHROID) 25 MCG tablet, Take 25 mcg by mouth daily before breakfast. , Disp: , Rfl: 2 .  metoprolol succinate (TOPROL-XL) 50 MG 24 hr tablet, Take 1 tablet (50 mg total) by mouth daily. Take with or immediately following a meal., Disp: 90 tablet, Rfl: 1 .  polyethylene glycol powder (GLYCOLAX/MIRALAX) powder, Take 17 g by  mouth daily. Dissolve 1 capful in 8 oz of liquid daily PRN (Patient taking differently: Take 17 g by mouth daily as needed (constipation.). ), Disp: 3350 g, Rfl: 1 .  rosuvastatin (CRESTOR) 10 MG tablet, Take 1 tablet (10 mg total) by mouth at bedtime., Disp: 90 tablet, Rfl: 3 .  tiZANidine (ZANAFLEX) 2 MG tablet, Take 2 mg by mouth 2 (two) times daily as needed for muscle spasms., Disp: , Rfl:  .  vitamin C (ASCORBIC ACID) 500 MG tablet, Take 500 mg by mouth daily., Disp: , Rfl:  .  predniSONE (DELTASONE) 20 MG tablet, Take 2 tablets (40 mg total) by mouth daily., Disp: 10 tablet, Rfl: 0 .  pregabalin (LYRICA) 25 MG capsule, Take 1 capsule (25 mg total) by mouth 2 (two) times daily., Disp: 60 capsule, Rfl: 1   Allergies  Allergen Reactions  . Anaprox [Naproxen Sodium] Rash     Family History  Problem Relation Age of Onset  . Diabetes Mother   . Hypertension Mother   . Heart failure Mother   . Heart disease Father   . Hypertension Father   . Thyroid disease Sister   . Healthy Sister   . Breast cancer Neg Hx      Social History   Socioeconomic History  . Marital status: Married    Spouse name: [name]  . Number of children: 1  . Years of education: Not on file  . Highest education level: Bachelor's degree (e.g., BA, AB, BS)  Occupational History  . Occupation: Retired  Tobacco Use  . Smoking status: Never Smoker  . Smokeless tobacco: Never Used  . Tobacco comment: smoking cessation materials not required  Substance and Sexual Activity  . Alcohol use: No  . Drug use: No  . Sexual activity: Yes    Birth control/protection: None, Surgical  Other Topics Concern  . Not on file  Social History Narrative   Recently moved from [facility] back to [facility].   Social Determinants of Health   Financial Resource Strain:   . Difficulty of Paying Living Expenses: Not on file  Food Insecurity:   . Worried About Charity fundraiser in the Last Year: Not on file  . Ran Out of Food in the Last  Year: Not on file  Transportation Needs:   . Lack of Transportation (Medical): Not on file  . Lack of Transportation (Non-Medical): Not on file  Physical Activity:   . Days of Exercise per Week: Not on file  . Minutes of Exercise per Session: Not on file  Stress:   . Feeling of Stress : Not on file  Social Connections:   . Frequency of Communication with Friends and Family: Not on file  . Frequency of Social Gatherings with Friends and Family: Not on file  . Attends Religious Services: Not on file  . Active Member of Clubs or Organizations: Not  on file  . Attends Archivist Meetings: Not on file  . Marital Status: Not on file  Intimate Partner Violence:   . Fear of Current or Ex-Partner: Not on file  . Emotionally Abused: Not on file  . Physically Abused: Not on file  . Sexually Abused: Not on file    Chart Review Today: I personally reviewed active problem list, medication list, allergies, family history, social history, health maintenance, notes from last encounter, lab results, imaging with the patient/caregiver today.  Review of Systems  Constitutional: Negative.   HENT: Negative.   Eyes: Negative.   Respiratory: Negative.   Cardiovascular: Negative.   Gastrointestinal: Negative.   Endocrine: Negative.   Genitourinary: Negative.   Musculoskeletal: Negative.   Skin: Negative.   Allergic/Immunologic: Negative.   Neurological: Negative.   Hematological: Negative.   Psychiatric/Behavioral: Negative.   All other systems reviewed and are negative.      Objective:   Vitals:   04/29/19 1516  BP: (!) 148/92  Pulse: 77  Resp: 14  Temp: (!) 97.1 F (36.2 C)  SpO2: 99%  Weight: 214 lb 8 oz (97.3 kg)  Height: 5\' 9"  (1.753 m)    Body mass index is 31.68 kg/m.  Physical Exam Vitals and nursing note reviewed.  Constitutional:      General: She is not in acute distress.    Appearance: Normal appearance. She is well-developed. She is not ill-appearing,  toxic-appearing or diaphoretic.     Comments: Appears uncomfortable  HENT:     Head: Normocephalic and atraumatic.  Eyes:     General:        Right eye: No discharge.        Left eye: No discharge.     Conjunctiva/sclera: Conjunctivae normal.  Neck:     Trachea: No tracheal deviation.  Cardiovascular:     Rate and Rhythm: Normal rate and regular rhythm.  Pulmonary:     Effort: Pulmonary effort is normal. No respiratory distress.     Breath sounds: No stridor.  Musculoskeletal:     Comments: Curvature to lower spine, left hip higher than right hip, no palpable midline or paraspinal muscle tenderness from cervical to lumbar spine some mild discomfort to left SI joint, no IT band tenderness to palpation, difficulty with range of motion getting up and down off of exam table, does have good internal and external rotation of hip and good strength in flexion of hip 5/5 bilateral dorsiflexion and plantarflexion, grossly normal sensation to light touch to bilateral lower extremities  Skin:    General: Skin is warm and dry.     Findings: No rash.  Neurological:     Mental Status: She is alert.     Motor: No abnormal muscle tone.     Coordination: Coordination normal.  Psychiatric:        Behavior: Behavior normal.      Results for orders placed or performed during the hospital encounter of 02/04/19  SARS CORONAVIRUS 2 (TAT 6-24 HRS) Nasopharyngeal Nasopharyngeal Swab   Specimen: Nasopharyngeal Swab  Result Value Ref Range   SARS Coronavirus 2 NEGATIVE NEGATIVE        Assessment & Plan:      ICD-10-CM   1. Left hip pain  M25.552 DG Hip Unilat W OR W/O Pelvis 2-3 Views Left    pregabalin (LYRICA) 25 MG capsule    DG Lumbar Spine Complete    Ambulatory referral to Physical Therapy  2. Left low back pain, unspecified  chronicity, unspecified whether sciatica present  M54.5 tiZANidine (ZANAFLEX) 2 MG tablet    pregabalin (LYRICA) 25 MG capsule    predniSONE (DELTASONE) 20 MG tablet      DG Lumbar Spine Complete    Ambulatory referral to Physical Therapy  3. Scoliosis, unspecified scoliosis type, unspecified spinal region  M41.9 Ambulatory referral to Physical Therapy    Patient with history of scoliosis and chronic back pain if she is not seeing any of her specialist and has come for a second time to complain about worsening pain with suspected sciatica, does seem that the lateral hip and radiating to groin she has a lot of pain but it was not reproducible with any manipulation of her hip or palpation of IT band still want to get a hip x-ray and can get lumbar spine x-rays as well, I have encouraged her to follow-up with her specialist or return to pain management but she does not want to do that.  Also suggested physical therapy would be very helpful for her but she does not want to do that either.  She just wants her pain better.  Since the description and history the pain does seem to be radicular in nature we will do a burst of steroids, start Lyrica for pain management, she can take Tylenol and/or ibuprofen over-the-counter and see if pain improves.  I have strongly suggested to her to go to physical therapy in the next week or 2 because it will decrease her pain and improve her range of motion and ability to function and get around.  I also think intensive work on scoliosis or any body mechanics would be very helpful for her to prevent future pain.    Delsa Grana, PA-C 04/29/19 3:49 PM

## 2019-04-29 NOTE — Patient Instructions (Signed)
Tylenol 650 - 1000mg  up to 3x a day  Ibuprofen 400-800 mg up to 3x a day   Start with lyrica 2x a day  Start the prednisone tomorrow morning with food.  Take tramadol very sparingly for severe pain after you have tried everything above

## 2019-05-02 ENCOUNTER — Telehealth: Payer: Self-pay

## 2019-05-02 ENCOUNTER — Encounter: Payer: Self-pay | Admitting: Family Medicine

## 2019-05-02 DIAGNOSIS — M25552 Pain in left hip: Secondary | ICD-10-CM

## 2019-05-02 DIAGNOSIS — M545 Low back pain, unspecified: Secondary | ICD-10-CM

## 2019-05-02 MED ORDER — TRAMADOL HCL 50 MG PO TABS: 50.0000 mg | ORAL_TABLET | Freq: Three times a day (TID) | ORAL | 0 refills | Status: AC | PRN

## 2019-05-02 NOTE — Telephone Encounter (Signed)
Copied from [place] [id number]. Topic: General - Other >> May 02, 2019  4:04 PM Greggory Keen D wrote: Reason for CRM: Pt called saying her husband was seen by Ms. Lucio Edward today  and she sent a note by him about medication for her for sciatica.  She wants to know if MS. Lucio Edward has sent anything in to the pharmacy for her or not  CB#  [phone]

## 2019-05-05 ENCOUNTER — Encounter: Payer: Self-pay | Admitting: Family Medicine

## 2019-05-05 MED ORDER — PREGABALIN 50 MG PO CAPS: 50.0000 mg | ORAL_CAPSULE | Freq: Two times a day (BID) | ORAL | 1 refills | Status: DC

## 2019-05-12 ENCOUNTER — Ambulatory Visit: Payer: Medicare Other | Admitting: Family Medicine

## 2019-05-14 ENCOUNTER — Ambulatory Visit: Payer: Medicare Other | Admitting: Family Medicine

## 2019-05-15 ENCOUNTER — Telehealth: Payer: Self-pay | Admitting: Family Medicine

## 2019-05-15 NOTE — Telephone Encounter (Signed)
traMADol (ULTRAM) 50 MG tablet   Patient is requesting partial refill of this medication to get her to her appointment on 02/02.    Pharmacy:  [id number] [address], [place] Phone:  [phone]  Fax:  [phone]

## 2019-05-16 NOTE — Telephone Encounter (Signed)
Told pt to take Meds as directed on AVS.

## 2019-05-20 ENCOUNTER — Ambulatory Visit: Payer: Medicare Other | Admitting: Family Medicine

## 2019-05-20 ENCOUNTER — Encounter: Payer: Self-pay | Admitting: Family Medicine

## 2019-05-20 ENCOUNTER — Ambulatory Visit (INDEPENDENT_AMBULATORY_CARE_PROVIDER_SITE_OTHER): Payer: Medicare Other | Admitting: Family Medicine

## 2019-05-20 ENCOUNTER — Other Ambulatory Visit: Payer: Self-pay

## 2019-05-20 VITALS — Ht 69.0 in | Wt 214.0 lb

## 2019-05-20 DIAGNOSIS — M419 Scoliosis, unspecified: Secondary | ICD-10-CM

## 2019-05-20 DIAGNOSIS — M545 Low back pain, unspecified: Secondary | ICD-10-CM

## 2019-05-20 MED ORDER — PREGABALIN 75 MG PO CAPS: 75.0000 mg | ORAL_CAPSULE | Freq: Two times a day (BID) | ORAL | 0 refills | Status: DC

## 2019-05-20 NOTE — Progress Notes (Signed)
Name: Rebecca Anthony   MRN: [id number]    DOB: 1945/05/26   Date:05/20/2019       Progress Note  Subjective:    Chief Complaint  Chief Complaint  Patient presents with  . Follow-up  . Back Pain    Pt states taking the lyrica taking 2 in am and 2 in pm with ibuprofen inbetween    I connected with  Rebecca Anthony  on 05/20/19 at  1:20 PM EST by a video enabled telemedicine application and verified that I am speaking with the correct person using two identifiers.  I discussed the limitations of evaluation and management by telemedicine and the availability of in person appointments. The patient expressed understanding and agreed to proceed. Staff also discussed with the patient that there may be a patient responsible charge related to this service. Patient Location: home Provider Location: [facility] Additional Individuals present: none  HPI Patient presents for follow-up on chronic pain, recently evaluated in clinic for acute on chronic back pain with sciatica treated with prednisone, Lyrica and encouraged to do other over-the-counter supportive in symptomatic measures including Tylenol, ibuprofen, heat therapy and she was strongly encouraged to go to physical therapy or back to her pain specialist or spinal specialist.  Overall pain has improved but is now resolved.  She is taking Lyrica 25 mg tablets -50 mg in the morning and 50 mg at bedtime.   .  Pain on average rated moderate 4-6/10. Taking tylenol and ibuprofen prn  Physical therapy - she has not called them back to do PT, she states she does not want to go anywhere until she is feeling better.  Pt is very anxious about continued chronic pelvic/abd pain that she has had for many years and we have previously reviewed the extensive work-up done by her prior PCP including multiple imaging modalities, all have been negative, no labs have been positive and history is unchanged and very atypical. Only thing that I have been able to find after  extensive chart review is that there is a Pancreatic cyst f/up do 01/2020   Patient Active Problem List   Diagnosis Date Noted  . Dyslipidemia 05/30/2018  . Prediabetes 05/14/2018  . Thyroid nodule 01/28/2018  . Gallbladder polyp 01/28/2018  . Cyst of pancreas 01/28/2018  . 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 Foraminal stenosis of thoracic region 01/28/2018  . Idiopathic scoliosis of thoracolumbar spine 11/28/2017  . Sigmoid diverticulosis 11/28/2017  . Chronic ankle pain (Fourth Area of Pain) (Left) 11/07/2017  . Chronic abdominal pain (LLQ) (Primary Area of Pain) (Left) 10/31/2017  . Chronic low back pain (Secondary Area of Pain) (Bilateral) (L>R) 10/31/2017  . Chronic pain syndrome 10/31/2017  . Chronic upper back pain [facility] Area of Pain) (Bilateral) (L>R) 10/31/2017  . Obesity (BMI 30.0-34.9) 09/13/2017  . Hematuria, microscopic 07/16/2017  . Hypothyroidism 02/23/2017  . LLQ abdominal pain 09/08/2016  . Osteopenia of left femoral neck 03/23/2016  . Onychomycosis 03/23/2016  . Abnormal EKG 08/29/2013  . PAF (paroxysmal atrial fibrillation) ([place])   . Hypertension     Social History   Tobacco Use  . Smoking status: Never Smoker  . Smokeless tobacco: Never Used  . Tobacco comment: smoking cessation materials not required  Substance Use Topics  . Alcohol use: No     Current Outpatient Medications:  .  aspirin 81 MG tablet, Take 1 tablet (81 mg total) by mouth daily. Do NOT take with Xarelto (just one or the other), Disp: 30 tablet,  Rfl:  .  Calcium Carb-Cholecalciferol (CALCIUM + D3 PO), Take 1 tablet by mouth daily., Disp: , Rfl:  .  HORSE CHESTNUT PO, Take 1 tablet by mouth daily., Disp: , Rfl:  .  ibuprofen (ADVIL) 800 MG tablet, Take 800 mg by mouth every 6 (six) hours as needed., Disp: , Rfl:  .  levothyroxine (SYNTHROID, LEVOTHROID) 25 MCG tablet, Take 25 mcg by mouth daily before breakfast. , Disp: , Rfl: 2 .  metoprolol succinate (TOPROL-XL) 50 MG 24 hr  tablet, Take 1 tablet (50 mg total) by mouth daily. Take with or immediately following a meal., Disp: 90 tablet, Rfl: 1 .  polyethylene glycol powder (GLYCOLAX/MIRALAX) powder, Take 17 g by mouth daily. Dissolve 1 capful in 8 oz of liquid daily PRN (Patient taking differently: Take 17 g by mouth daily as needed (constipation.). ), Disp: 3350 g, Rfl: 1 .  predniSONE (DELTASONE) 20 MG tablet, Take 2 tablets (40 mg total) by mouth daily., Disp: 10 tablet, Rfl: 0 .  pregabalin (LYRICA) 50 MG capsule, Take 1 capsule (50 mg total) by mouth 2 (two) times daily. Increased dose, Disp: 60 capsule, Rfl: 1 .  rosuvastatin (CRESTOR) 10 MG tablet, Take 1 tablet (10 mg total) by mouth at bedtime., Disp: 90 tablet, Rfl: 3 .  tiZANidine (ZANAFLEX) 2 MG tablet, Take 2 mg by mouth 2 (two) times daily as needed for muscle spasms., Disp: , Rfl:  .  vitamin C (ASCORBIC ACID) 500 MG tablet, Take 500 mg by mouth daily., Disp: , Rfl:   Allergies  Allergen Reactions  . Anaprox [Naproxen Sodium] Rash      Review of Systems  Constitutional: Negative.   HENT: Negative.   Eyes: Negative.   Respiratory: Negative.   Cardiovascular: Negative.   Gastrointestinal: Negative.   Endocrine: Negative.   Genitourinary: Negative.   Musculoskeletal: Negative.   Skin: Negative.   Allergic/Immunologic: Negative.   Neurological: Negative.   Hematological: Negative.   Psychiatric/Behavioral: Negative.   All other systems reviewed and are negative.     Objective:   Virtual encounter, vitals limited, only able to obtain the following Today's Vitals   05/20/19 1338  Weight: 214 lb (97.1 kg)  Height: 5\' 9"  (1.753 m)   Body mass index is 31.6 kg/m. Nursing Note and Vital Signs reviewed.  Physical Exam Patient alert, phonation clear PE limited by telephone encounter  No results found for this or any previous visit (from the past 72 hour(s)).  Assessment and Plan:     ICD-10-CM   1. Left low back pain, unspecified  chronicity, unspecified whether sciatica present  M54.5    Lyrica 50 mg twice daily we will try and increase Lyrica at bedtime to 75 mg, continue as needed NSAIDs and Tylenol strongly urged PT eval or spinal specialist  2. Scoliosis, unspecified scoliosis type, unspecified spinal region  M41.9     I did explain to the patient thoroughly that patient will not likely feel better until she goes to do some physical therapy which is the treatment for her pain.  She has had a acceptable pain response to the medications and is generally feeling little bit better and mobility is better.  No red flags.  But she does have history of chronic pain, scoliosis and chronic and vague abdominal and pelvic pain with multiple extensive unremarkable work-ups.  She continues to return to clinic but refused to follow-up with her managing specialist including pain management, spinal specialist.  She has been [facility] by [facility], physical therapy, physical and occupational therapy for chronic low back pain and chronic left lower quadrant abdominal pain.    I have ordered x-rays of her lumbar spine and hips which she is still not completed.  -Red flags and when to present for emergency care or RTC including fever >101.59F, chest pain, shortness of breath, new/worsening/un-resolving symptoms, reviewed with patient at time of visit. Follow up and care instructions discussed and provided in AVS. - I discussed the assessment and treatment plan with the patient. The patient was provided an opportunity to ask questions and all were answered. The patient agreed with the plan and demonstrated an understanding of the instructions.  I provided 12 minutes of non-face-to-face time during this encounter.  Delsa Grana, PA-C 05/20/19 2:00 PM

## 2019-05-21 ENCOUNTER — Other Ambulatory Visit: Payer: Self-pay

## 2019-05-21 DIAGNOSIS — I10 Essential (primary) hypertension: Secondary | ICD-10-CM

## 2019-05-21 DIAGNOSIS — I480 Paroxysmal atrial fibrillation: Secondary | ICD-10-CM

## 2019-05-21 MED ORDER — METOPROLOL SUCCINATE ER 50 MG PO TB24: 50.0000 mg | ORAL_TABLET | Freq: Every day | ORAL | 1 refills | Status: DC

## 2019-05-21 NOTE — Telephone Encounter (Signed)
Pt called and would like to know if she can go back to the 25mg  so she can take less if needed . She thinks the 75Mg  is to much for her .  Please advise

## 2019-05-22 ENCOUNTER — Other Ambulatory Visit: Payer: Self-pay | Admitting: Family Medicine

## 2019-05-22 DIAGNOSIS — M545 Low back pain, unspecified: Secondary | ICD-10-CM

## 2019-05-22 DIAGNOSIS — M25552 Pain in left hip: Secondary | ICD-10-CM

## 2019-05-22 DIAGNOSIS — G8929 Other chronic pain: Secondary | ICD-10-CM

## 2019-05-22 MED ORDER — PREGABALIN 25 MG PO CAPS: 25.0000 mg | ORAL_CAPSULE | Freq: Three times a day (TID) | ORAL | 0 refills | Status: DC | PRN

## 2019-05-22 MED ORDER — PREGABALIN 50 MG PO CAPS: 50.0000 mg | ORAL_CAPSULE | Freq: Two times a day (BID) | ORAL | 0 refills | Status: DC

## 2019-05-22 NOTE — Telephone Encounter (Signed)
Left vm

## 2019-05-28 ENCOUNTER — Encounter: Payer: Self-pay | Admitting: Family Medicine

## 2019-06-02 ENCOUNTER — Encounter: Payer: Self-pay | Admitting: Family Medicine

## 2019-06-03 ENCOUNTER — Ambulatory Visit: Payer: Medicare Other | Admitting: Family Medicine

## 2019-06-30 ENCOUNTER — Ambulatory Visit
Admission: RE | Admit: 2019-06-30 | Discharge: 2019-06-30 | Disposition: A | Discharge status: Home / Self Care | Payer: Medicare Other | Source: Ambulatory Visit | Attending: Family Medicine | Admitting: Family Medicine

## 2019-06-30 ENCOUNTER — Other Ambulatory Visit: Payer: Self-pay

## 2019-06-30 DIAGNOSIS — M545 Low back pain: Secondary | ICD-10-CM | POA: Diagnosis present

## 2019-06-30 DIAGNOSIS — M25552 Pain in left hip: Secondary | ICD-10-CM | POA: Diagnosis present

## 2019-07-04 DIAGNOSIS — M25552 Pain in left hip: Secondary | ICD-10-CM

## 2019-07-10 ENCOUNTER — Telehealth: Payer: Self-pay | Admitting: Podiatry

## 2019-07-10 NOTE — Telephone Encounter (Signed)
Pt called wanted to get in sooner offered appt w/Dr.Patel on April 5 a few days sooner pt decided to wait for Dr.Hyatt

## 2019-07-14 ENCOUNTER — Ambulatory Visit: Payer: Medicare Other | Admitting: Podiatry

## 2019-07-23 ENCOUNTER — Ambulatory Visit: Payer: Medicare Other | Admitting: Podiatry

## 2019-07-23 ENCOUNTER — Encounter: Payer: Self-pay | Admitting: Podiatry

## 2019-07-23 ENCOUNTER — Ambulatory Visit (INDEPENDENT_AMBULATORY_CARE_PROVIDER_SITE_OTHER): Payer: Medicare Other

## 2019-07-23 ENCOUNTER — Other Ambulatory Visit: Payer: Self-pay

## 2019-07-23 DIAGNOSIS — B351 Tinea unguium: Secondary | ICD-10-CM | POA: Diagnosis not present

## 2019-07-23 DIAGNOSIS — B353 Tinea pedis: Secondary | ICD-10-CM

## 2019-07-23 DIAGNOSIS — M76822 Posterior tibial tendinitis, left leg: Secondary | ICD-10-CM | POA: Diagnosis not present

## 2019-07-23 DIAGNOSIS — M76829 Posterior tibial tendinitis, unspecified leg: Secondary | ICD-10-CM

## 2019-07-23 DIAGNOSIS — M79676 Pain in unspecified toe(s): Secondary | ICD-10-CM | POA: Diagnosis not present

## 2019-07-23 MED ORDER — NAFTIFINE HCL 2 % EX CREA: 1.0000 "application " | TOPICAL_CREAM | Freq: Two times a day (BID) | CUTANEOUS | 2 refills | Status: DC

## 2019-07-23 NOTE — Progress Notes (Signed)
She presents today with her husband for routine foot care.  She is also complaining of left lower leg pain and left ankle pain.  She states that she has noticed some purple-blue variation in color of her skin and spots in these areas that are painful.  She also states that she is been seen by neurology primary care and is going to follow-up with an orthopedic doctor for pain in her back her buttocks and her anterior groin area.  Objective: Vital signs are stable alert oriented x3 mild tinea pedis is bilateral pulses are palpable.  Toenails are long thick yellow dystrophic-like mycotic the discoloration of the medial ankle is varicosities with moderate edema and tenderness on palpation.  Assessment: Tinea pedis onychomycosis venous stasis varicosities probable sciatica  Plan: Discussed etiology pathology conservative versus surgical therapies.  At this point I highly recommended Naftin cream to be applied daily until resolution of the tinea pedis.  I also debrided toenails 1 through 5 bilateral.  I recommended that she wear her compression hose to prevent further development of the venous stasis and varicosities.  I will follow-up with her in 3 months should she need referral to neurosurgery I will make 1.

## 2019-07-29 ENCOUNTER — Ambulatory Visit: Payer: Medicare Other | Admitting: Family Medicine

## 2019-08-28 DIAGNOSIS — M5416 Radiculopathy, lumbar region: Secondary | ICD-10-CM | POA: Insufficient documentation

## 2019-10-22 ENCOUNTER — Encounter: Payer: Self-pay | Admitting: Podiatry

## 2019-10-22 ENCOUNTER — Ambulatory Visit: Payer: Medicare Other | Admitting: Podiatry

## 2019-10-22 ENCOUNTER — Other Ambulatory Visit: Payer: Self-pay

## 2019-10-22 DIAGNOSIS — Q828 Other specified congenital malformations of skin: Secondary | ICD-10-CM | POA: Diagnosis not present

## 2019-10-22 DIAGNOSIS — M79676 Pain in unspecified toe(s): Secondary | ICD-10-CM

## 2019-10-22 DIAGNOSIS — B351 Tinea unguium: Secondary | ICD-10-CM | POA: Diagnosis not present

## 2019-10-22 NOTE — Progress Notes (Signed)
Subjective:  [name], female    DOB: [birth year],  MRN: [id number] HPI Chief Complaint  Patient presents with   Nail Problem    Nail trim and RFC    74 y.o. female presents with the above complaint.   ROS: Denies fever chills nausea vomiting muscle aches pains calf pain back pain chest pain shortness of breath.  Relates a history of sciatica left.  Past Medical History:  Diagnosis Date   Diverticulitis    Diverticulosis    Dysuria    Heart murmur    Hematuria, unspecified    History of shingles    History of swelling of feet    Hypercholesterolemia    Hypertension    Hypothyroidism    Irritable bowel syndrome    Lichenification and lichen simplex chronicus    Localized superficial swelling, mass, or lump    Osteoarthrosis, unspecified whether generalized or localized, unspecified site    Osteoporosis, unspecified    PAF (paroxysmal atrial fibrillation) ([place])    Diagnosed years ago -- originally on Quinidine   Sinus problem    Synovial cyst, unspecified    Past Surgical History:  Procedure Laterality Date   ABDOMINAL HYSTERECTOMY     ANKLE SURGERY Left    Torned tendon   APPENDECTOMY     AV FISTULA REPAIR     BUNIONECTOMY     CATARACT EXTRACTION W/PHACO Right 01/02/2019   Procedure: CATARACT EXTRACTION PHACO AND INTRAOCULAR LENS PLACEMENT ([place]) RIGHT;  Surgeon: Birder Robson, MD;  Location: [facility] ORS;  Service: Ophthalmology;  Laterality: Right;  [place] 00:42 CDE 7.91 fluid pack lot # [id number]   CATARACT EXTRACTION W/PHACO Left 02/07/2019   Procedure: CATARACT EXTRACTION PHACO AND INTRAOCULAR LENS PLACEMENT ([place]) LEFT;  Surgeon: Birder Robson, MD;  Location: [facility] ORS;  Service: Ophthalmology;  Laterality: Left;  [place] 00:36.3CDE 5.01Fluid Pack Lot # [id number]   COLONOSCOPY     FOOT SURGERY Left    VAGINAL HYSTERECTOMY     VESICOVAGINAL FISTULA CLOSURE W/ TAH      Current Outpatient Medications:    aspirin 81 MG  tablet, Take 1 tablet (81 mg total) by mouth daily. Do NOT take with Xarelto (just one or the other), Disp: 30 tablet, Rfl:    calcium carbonate (OSCAL) 1500 (600 Ca) MG TABS tablet, Take by mouth., Disp: , Rfl:    gabapentin (NEURONTIN) 100 MG capsule, Take 100 mg by mouth 3 (three) times daily., Disp: , Rfl:    HORSE CHESTNUT PO, Take 1 tablet by mouth daily., Disp: , Rfl:    HYDROcodone-acetaminophen (NORCO/VICODIN) 5-325 MG tablet, Take 1 tablet by mouth every 4 (four) hours., Disp: , Rfl:    levothyroxine (SYNTHROID, LEVOTHROID) 25 MCG tablet, Take 25 mcg by mouth daily before breakfast. , Disp: , Rfl: 2   metoprolol succinate (TOPROL-XL) 50 MG 24 hr tablet, Take 1 tablet (50 mg total) by mouth daily. Take with or immediately following a meal., Disp: 90 tablet, Rfl: 1   polyethylene glycol powder (GLYCOLAX/MIRALAX) powder, Take 17 g by mouth daily. Dissolve 1 capful in 8 oz of liquid daily PRN (Patient taking differently: Take 17 g by mouth daily as needed (constipation.). ), Disp: 3350 g, Rfl: 1   vitamin C (ASCORBIC ACID) 500 MG tablet, Take 500 mg by mouth daily., Disp: , Rfl:   Allergies  Allergen Reactions   Anaprox [Naproxen Sodium] Rash   Review of Systems Objective:  There were no vitals filed for this visit.  General: Well  developed, nourished, in no acute distress, alert and oriented x3   Dermatological: Skin is warm, dry and supple bilateral. Nails x 10 are well maintained but thickened and dystrophic possibly mycotic.; remaining integument appears unremarkable at this time other than a reactive hyperkeratotic lesion subfirst metatarsophalangeal joint left greater than right. There are no open sores, no preulcerative lesions, no rash or signs of infection present.  Vascular: Dorsalis Pedis artery and Posterior Tibial artery pedal pulses are 2/4 bilateral with immedate capillary fill time. Pedal hair growth present. No varicosities and no lower extremity edema present  bilateral.   Neruologic: Grossly intact via light touch bilateral. Vibratory intact via tuning fork bilateral. Protective threshold with Semmes Wienstein monofilament intact to all pedal sites bilateral. Patellar and Achilles deep tendon reflexes 2+ bilateral. No Babinski or clonus noted bilateral.   Musculoskeletal: No gross boney pedal deformities bilateral. No pain, crepitus, or limitation noted with foot and ankle range of motion bilateral. Muscular strength 5/5 in all groups tested bilateral.  External rotation of the left leg at the thigh.  She also has tight hamstrings and tight gastrosoleus complex.  Gait: Unassisted, Nonantalgic.    Radiographs:  None taken  Assessment & Plan:   Assessment: Painful callus forefoot left painful mycotic nails bilateral.  Plan: Debridement of reactive hyperkeratotic tissue placed padding.  Debridement of toenails 1 through 5 bilateral.  Recommended that she get back to physical therapy for gait training balance training and strength training.     Sydelle Sherfield T. [place], [place]

## 2019-10-24 DIAGNOSIS — M5432 Sciatica, left side: Secondary | ICD-10-CM | POA: Insufficient documentation

## 2019-11-13 ENCOUNTER — Other Ambulatory Visit: Payer: Self-pay | Admitting: Family Medicine

## 2019-11-13 DIAGNOSIS — I480 Paroxysmal atrial fibrillation: Secondary | ICD-10-CM

## 2019-11-13 DIAGNOSIS — I10 Essential (primary) hypertension: Secondary | ICD-10-CM

## 2019-11-28 ENCOUNTER — Telehealth: Payer: Self-pay

## 2019-11-28 NOTE — Telephone Encounter (Signed)
Pt states she is applying for life ins.  They looked through her files and noticed Dr. Sanda Klein had prescribed Xarelto back in 2019 and they are questioning.  She needs a letter stating she is not currently taking nor is it on her med list.  Last prescribed by Dr. Sanda Klein who is no longer here.  It was not given to treat or b/c of heart disease but more for a precaution. Since Patient has been seeing me she has not been on this medication and she is not having any complications.

## 2019-12-04 ENCOUNTER — Ambulatory Visit: Payer: Medicare Other | Admitting: Family Medicine

## 2019-12-04 ENCOUNTER — Other Ambulatory Visit: Payer: Self-pay

## 2019-12-04 ENCOUNTER — Encounter: Payer: Self-pay | Admitting: Family Medicine

## 2019-12-04 VITALS — BP 118/76 | HR 74 | Temp 98.6°F | Resp 16 | Ht 69.0 in | Wt 205.6 lb

## 2019-12-04 DIAGNOSIS — I482 Chronic atrial fibrillation, unspecified: Secondary | ICD-10-CM | POA: Diagnosis not present

## 2019-12-04 DIAGNOSIS — Z0289 Encounter for other administrative examinations: Secondary | ICD-10-CM | POA: Diagnosis not present

## 2019-12-04 DIAGNOSIS — I700 Atherosclerosis of aorta: Secondary | ICD-10-CM | POA: Diagnosis not present

## 2019-12-04 DIAGNOSIS — E785 Hyperlipidemia, unspecified: Secondary | ICD-10-CM

## 2019-12-04 MED ORDER — ATORVASTATIN CALCIUM 10 MG PO TABS: 10.0000 mg | ORAL_TABLET | Freq: Every day | ORAL | 3 refills | Status: DC

## 2019-12-04 NOTE — Progress Notes (Signed)
Name: Rebecca Anthony   MRN: [id number]    DOB: [birth year]   Date:12/04/2019       Progress Note  Chief Complaint  Patient presents with  . paperwork    for life insurance.  Pt is needing letter   . Consult     Subjective:   Rebecca Anthony is a 74 y.o. female, presents to clinic requesting letter for insurance (death insurance?) explaining prior prescription for xarelto. Xarelto was seen on medication list from 2 years ago and the company had questions.  Per CMA today:   . paperwork    for life insurance.  Pt is needing letter   "Pt states she is applying for life ins. They looked through her files and noticed Dr. Sanda Klein had prescribed Xarelto back in 2019 and they are questioning. She needs a letter stating she is not currently taking nor is it on her med list. Last prescribed by Dr. Sanda Klein who is no longer here. It was not given to treat or b/c of heart disease but more for a precaution. Since Patient has been seeing me she has not been on this medication and she is not having any complications."  Chart was reviewed today, including past EKGs, problem list, med list.  Pt reports "her whole life" she had abnormal heart rate - per chart she has dx of PAF, previously saw Dr. Selena Batten, but not since 2015.   All EKGs reviewed 2105, 2017, and 2019 - all show A Fib with PVC's and PAC's.  In 2019 Dr. Sanda Klein prescribed xarelto in place of ASA, but pt declined to start the medication.  12/2017 HPI from Dr. Sanda Klein Paroxysmal atrial fibrillation; chronic problem; seeing cardiologist; on toprol; does feel strong heart beat at night sometimes; if she is rushing or working hard, she'll feel it more, then calms down and it comes back down; saw Dr. Ellyn Hack years ago; going to see Dr. Rockey Situ on Sept 24th; no chest pain; on aspirin to prevent stroke; she decided to not start the Xarelto and wants to wait on him; still taking 81 mg aspirin; no bleeding from nose, gums, urine, or rectum Pt never did consult with  Dr. Rockey Situ  08/2017 - with OV with Dr. Sanda Klein - pt gave tx options of ASA vs Xarelto for stroke prevention with Paroxymal Afib HPI:  "She has intermittent atrial fibrillation; on beta-blocker; was told for years that she had a strange heartbeat; they did an echo and that didn't show anything" A&P: Cardiovascular and Mediastinum    PAF (paroxysmal atrial fibrillation) ([place]) - Primary (Chronic)   Relevant Medications   rivaroxaban (XARELTO) 20 MG TABS tablet   aspirin 81 MG tablet   Other Relevant Orders   Ambulatory referral to Cardiology   EKG 12-Lead (Completed)   Prior recommendations from cardiology: Dr. Ellyn Hack in 2015 per chart review: "PAF (paroxysmal atrial fibrillation) This does seem like a chronic diagnosis for her. I have no concept of water to A. fib burden is. I went through the explanation of what atrial fibrillation is mechanistically, and the concerns of heart failure versus stroke.  I spent probably 20 minutes going over the various options falling on her CHA2DS2Vasc score of 31 (age, female sex, and hypertension).   The final issue is that she is at risk for stroke and should consider being on full anticoagulation with either a NOAC or warfarin. She is very reluctant to consider doing this, stating that she was to stay as conservative as possible.  The final issue is that she is at risk for stroke and should consider being on full anticoagulation with either a NOAC or warfarin. She is very reluctant to consider doing this, stating that she was to stay as conservative as possible.  My overall recommendation was for her to wear an event monitor for a month in order for [place] to determine her A. fib burden. If high, this was used more toward falling evaluation, however if she has very rare/bleeding episodes, while not favorable, aspirin would be likely be less under treatment.  Plan: Continue metoprolol, start with baby aspirin today  LexiScan Myoview to assess for  potential ischemic etiology of A. fib given her age and hypertension. Using LexiScan to avoid having to hold her beta blocker.  30 day event monitor (provided she will wear it)  2-D echocardiogram to assess LV function and chamber sizes, most notably the left atrial size.  If she remains relatively asymptomatic with short bursts, my recommendation would be to simply stay with rate control as opposed to rhythm control."  Pt declined to have lexiscan myoview stress test done at that time. 09/09/2013 2D ECHO: Notes Recorded by Leonie Man, MD on 09/10/2013 at 11:37 AM Echo results: Good news: Essentially normal echocardiogram and normal pump function and normal valve function. No signs to suggest heart attack.. EF: 55-60%. No regional wall motion abnormalities  Leonie Man, MD  She does have HLD, pt had previously refused statin and wanted to work on diet and lifestyle. Per past CP scans, there is known aortic atherosclerosis, no noted CAD. Lab Results  Component Value Date   CHOL 207 (H) 01/24/2019   HDL 73 01/24/2019   LDLCALC 119 (H) 01/24/2019   TRIG 61 01/24/2019   CHOLHDL 2.8 01/24/2019  The 10-year ASCVD risk score Mikey Bussing DC Jr., et al., 2013) is: 15.6%   Values used to calculate the score:     Age: 89 years     Sex: Female     Is Non-Hispanic African American: Yes     Diabetic: No     Tobacco smoker: No     Systolic Blood Pressure: 631 mmHg     Is BP treated: Yes     HDL Cholesterol: 73 mg/dL     Total Cholesterol: 207 mg/dL  Pt continues to have intermittent palpitations, denies LE edema, near syncope, DOE, CP, orthopnea, PND. She take metoprolol 50 mg for rate control Afib and for HTN, BP well controlled She continues to take 81 mg ASA daily    Current Outpatient Medications:  .  aspirin 81 MG tablet, Take 1 tablet (81 mg total) by mouth daily. Do NOT take with Xarelto (just one or the other), Disp: 30 tablet, Rfl:  .  calcium carbonate (OSCAL) 1500 (600  Ca) MG TABS tablet, Take by mouth., Disp: , Rfl:  .  HORSE CHESTNUT PO, Take 1 tablet by mouth daily., Disp: , Rfl:  .  HYDROcodone-acetaminophen (NORCO/VICODIN) 5-325 MG tablet, Take 1 tablet by mouth every 4 (four) hours., Disp: , Rfl:  .  levothyroxine (SYNTHROID, LEVOTHROID) 25 MCG tablet, Take 25 mcg by mouth daily before breakfast. , Disp: , Rfl: 2 .  metoprolol succinate (TOPROL-XL) 50 MG 24 hr tablet, TAKE 1 TABLET (50 MG TOTAL) BY MOUTH DAILY. TAKE WITH OR IMMEDIATELY FOLLOWING A MEAL., Disp: 90 tablet, Rfl: 1 .  polyethylene glycol powder (GLYCOLAX/MIRALAX) powder, Take 17 g by mouth daily. Dissolve 1 capful in 8 oz of liquid daily PRN (  Patient taking differently: Take 17 g by mouth daily as needed (constipation.). ), Disp: 3350 g, Rfl: 1 .  vitamin C (ASCORBIC ACID) 500 MG tablet, Take 500 mg by mouth daily., Disp: , Rfl:  .  atorvastatin (LIPITOR) 10 MG tablet, Take 1 tablet (10 mg total) by mouth at bedtime., Disp: 90 tablet, Rfl: 3 .  gabapentin (NEURONTIN) 100 MG capsule, Take 100 mg by mouth 3 (three) times daily. (Patient not taking: Reported on 12/04/2019), Disp: , Rfl:   Patient Active Problem List   Diagnosis Date Noted  . Dyslipidemia 05/30/2018  . Prediabetes 05/14/2018  . Thyroid nodule 01/28/2018  . Gallbladder polyp 01/28/2018  . Cyst of pancreas 01/28/2018  . 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 Foraminal stenosis of thoracic region 01/28/2018  . Idiopathic scoliosis of thoracolumbar spine 11/28/2017  . Sigmoid diverticulosis 11/28/2017  . Chronic ankle pain (Fourth Area of Pain) (Left) 11/07/2017  . Chronic abdominal pain (LLQ) (Primary Area of Pain) (Left) 10/31/2017  . Chronic low back pain (Secondary Area of Pain) (Bilateral) (L>R) 10/31/2017  . Chronic pain syndrome 10/31/2017  . Chronic upper back pain [facility] Area of Pain) (Bilateral) (L>R) 10/31/2017  . Obesity (BMI 30.0-34.9) 09/13/2017  . Hematuria, microscopic 07/16/2017  . Hypothyroidism 02/23/2017   . LLQ abdominal pain 09/08/2016  . Osteopenia of left femoral neck 03/23/2016  . Onychomycosis 03/23/2016  . Abnormal EKG 08/29/2013  . PAF (paroxysmal atrial fibrillation) ([place])   . Hypertension     Past Surgical History:  Procedure Laterality Date  . ABDOMINAL HYSTERECTOMY    . ANKLE SURGERY Left    Torned tendon  . APPENDECTOMY    . AV FISTULA REPAIR    . BUNIONECTOMY    . CATARACT EXTRACTION W/PHACO Right 01/02/2019   Procedure: CATARACT EXTRACTION PHACO AND INTRAOCULAR LENS PLACEMENT ([place]) RIGHT;  Surgeon: Birder Robson, MD;  Location: [facility] ORS;  Service: Ophthalmology;  Laterality: Right;  [place] 00:42 CDE 7.91 fluid pack lot # [id number]  . CATARACT EXTRACTION W/PHACO Left 02/07/2019   Procedure: CATARACT EXTRACTION PHACO AND INTRAOCULAR LENS PLACEMENT ([place]) LEFT;  Surgeon: Birder Robson, MD;  Location: [facility] ORS;  Service: Ophthalmology;  Laterality: Left;  [place] 00:36.3CDE 5.01Fluid Pack Lot # [id number]  . COLONOSCOPY    . FOOT SURGERY Left   . VAGINAL HYSTERECTOMY    . VESICOVAGINAL FISTULA CLOSURE W/ TAH      Family History  Problem Relation Age of Onset  . Diabetes Mother   . Hypertension Mother   . Heart failure Mother   . Heart disease Father   . Hypertension Father   . Thyroid disease Sister   . Healthy Sister   . Breast cancer Neg Hx     Social History   Tobacco Use  . Smoking status: Never Smoker  . Smokeless tobacco: Never Used  . Tobacco comment: smoking cessation materials not required  Vaping Use  . Vaping Use: Never used  Substance Use Topics  . Alcohol use: No  . Drug use: No     Allergies  Allergen Reactions  . Anaprox [Naproxen Sodium] Rash    Health Maintenance  Topic Date Due  . MAMMOGRAM  03/26/2019  . Fecal DNA (Cologuard)  10/27/2019  . INFLUENZA VACCINE  11/16/2019  . COLON CANCER SCREENING ANNUAL FOBT  05/08/2020  . TETANUS/TDAP  05/24/2020  . DEXA SCAN  Completed  . COVID-19 Vaccine  Completed  . Hepatitis C Screening   Completed  . PNA vac Low Risk Adult  Completed  Chart Review Today: I personally reviewed active problem list, medication list, allergies, family history, social history, health maintenance, notes from last encounter, lab results, imaging with the patient/caregiver today.   Review of Systems  10 Systems reviewed and are negative for acute change except as noted in the HPI.   Objective:   Vitals:   12/04/19 1513  BP: 118/76  Pulse: 74  Resp: 16  Temp: 98.6 F (37 C)  TempSrc: Oral  SpO2: 96%  Weight: 205 lb 9.6 oz (93.3 kg)  Height: 5\' 9"  (1.753 m)    Body mass index is 30.36 kg/m.  Physical Exam Vitals and nursing note reviewed.  Constitutional:      General: She is not in acute distress.    Appearance: Normal appearance. She is obese. She is not ill-appearing, toxic-appearing or diaphoretic.     Comments: Elderly female, well appearing, appears stated age  HENT:     Head: Normocephalic and atraumatic.     Right Ear: External ear normal.     Left Ear: External ear normal.  Eyes:     General: No scleral icterus.       Right eye: No discharge.        Left eye: No discharge.     Conjunctiva/sclera: Conjunctivae normal.  Cardiovascular:     Rate and Rhythm: Normal rate. Rhythm irregularly irregular.     Chest Wall: PMI is not displaced.     Pulses: Normal pulses.          Radial pulses are 2+ on the right side and 2+ on the left side.     Heart sounds: No murmur heard.  No friction rub.  Pulmonary:     Effort: Pulmonary effort is normal. No respiratory distress.     Breath sounds: Normal breath sounds. No wheezing, rhonchi or rales.  Abdominal:     General: Bowel sounds are normal.     Palpations: Abdomen is soft.  Musculoskeletal:     Right lower leg: No edema.     Left lower leg: No edema.  Skin:    General: Skin is warm and dry.     Capillary Refill: Capillary refill takes less than 2 seconds.     Coloration: Skin is not jaundiced or pale.    Neurological:     Mental Status: She is alert. Mental status is at baseline.     Gait: Gait abnormal.  Psychiatric:        Mood and Affect: Mood normal.        Behavior: Behavior normal.         Assessment & Plan:     ICD-10-CM   1. Chronic atrial fibrillation (HCC)  I48.20    extensive chart review, multiple EKG's, ECHO, cardiology workup - appears chronic for >5-10 years, currently rate controlled with metoprolol I printed the prior assessment, plan and recommendations of Dr. Ellyn Hack (cardiology) regarding NOAC and gave to pt in AVS.   She has stable sx - occasional palpitations, but not other current signs or sx of CHF or ACS.   I do still think it would be beneficial for pt to return to cardiology for consult and completion of prior suggested work up.  This patients CHA2DS2-VASc Score and unadjusted Ischemic Stroke Rate (% per year) is equal to 9.7 % stroke rate/year from a score of 6  Above score calculated as 1 point each if present [CHF, HTN, DM, Vascular=MI/PAD/Aortic Plaque, Age if 65-74, or Female] Above score calculated as 2  points each if present [Age > 75, or Stroke/TIA/TE]    2. Dyslipidemia  E78.5 atorvastatin (LIPITOR) 10 MG tablet   hx of HLD, managed with lifestyle due to pt previously refusing statin, reviewed statin meds, MOA, benefit/SE, info given in handouts and AVS, start lipitor 10   The 10-year ASCVD risk score Mikey Bussing DC Jr., et al., 2013) is: 15.6%   Values used to calculate the score:     Age: 89 years     Sex: Female     Is Non-Hispanic African American: Yes     Diabetic: No     Tobacco smoker: No     Systolic Blood Pressure: 128 mmHg     Is BP treated: Yes     HDL Cholesterol: 73 mg/dL     Total Cholesterol: 207 mg/dL    3. Aortic atherosclerosis (HCC)  I70.0 atorvastatin (LIPITOR) 10 MG tablet   per past CT scan - pt agreeable to trying lipitor  4. Encounter for completion of form with patient  Z02.89    letter written for patient stating  current known dx and circumstances regarding xarelto prescription.      Return in about 3 months (around 03/05/2020) for Come back in the next 2-3 months - overdue for routine ov and labs.    Greater than 50% of this visit was spent in direct face-to-face counseling, obtaining history and physical, discussing and educating pt on treatment plan.  Total time of this visit was 40 min +.  Remainder of time involved but was not limited to reviewing chart (recent and pertinent OV notes and labs), documentation in EMR, and coordinating care and treatment plan.    Delsa Grana, PA-C 12/04/19 4:50 PM

## 2019-12-04 NOTE — Progress Notes (Deleted)
[name], female    DOB: February 17, 1946, 74 y.o.   MRN: [id number]  PCP: Delsa Grana, PA-C  Chief Complaint  Patient presents with  . paperwork    for life insurance.  Pt is needing letter   Pt states she is applying for life ins.  They looked through her files and noticed Dr. Sanda Klein had prescribed Xarelto back in 2019 and they are questioning.  She needs a letter stating she is not currently taking nor is it on her med list.  Last prescribed by Dr. Sanda Klein who is no longer here.  It was not given to treat or b/c of heart disease but more for a precaution. Since Patient has been seeing me she has not been on this medication and she is not having any complications.  Subjective:   Rebecca Anthony is a 74 y.o. female, presents to clinic with CC of the following:  HPI    Patient Active Problem List   Diagnosis Date Noted  . Dyslipidemia 05/30/2018  . Prediabetes 05/14/2018  . Thyroid nodule 01/28/2018  . Gallbladder polyp 01/28/2018  . Cyst of pancreas 01/28/2018  . 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 Foraminal stenosis of thoracic region 01/28/2018  . Idiopathic scoliosis of thoracolumbar spine 11/28/2017  . Sigmoid diverticulosis 11/28/2017  . Chronic ankle pain (Fourth Area of Pain) (Left) 11/07/2017  . Chronic abdominal pain (LLQ) (Primary Area of Pain) (Left) 10/31/2017  . Chronic low back pain (Secondary Area of Pain) (Bilateral) (L>R) 10/31/2017  . Chronic pain syndrome 10/31/2017  . Chronic upper back pain [facility] Area of Pain) (Bilateral) (L>R) 10/31/2017  . Obesity (BMI 30.0-34.9) 09/13/2017  . Hematuria, microscopic 07/16/2017  . Hypothyroidism 02/23/2017  . LLQ abdominal pain 09/08/2016  . Osteopenia of left femoral neck 03/23/2016  . Onychomycosis 03/23/2016  . Abnormal EKG 08/29/2013  . PAF (paroxysmal atrial fibrillation) ([place])   . Hypertension       Current Outpatient Medications:  .  aspirin 81 MG tablet, Take 1 tablet (81 mg total) by  mouth daily. Do NOT take with Xarelto (just one or the other), Disp: 30 tablet, Rfl:  .  calcium carbonate (OSCAL) 1500 (600 Ca) MG TABS tablet, Take by mouth., Disp: , Rfl:  .  gabapentin (NEURONTIN) 100 MG capsule, Take 100 mg by mouth 3 (three) times daily., Disp: , Rfl:  .  HORSE CHESTNUT PO, Take 1 tablet by mouth daily., Disp: , Rfl:  .  HYDROcodone-acetaminophen (NORCO/VICODIN) 5-325 MG tablet, Take 1 tablet by mouth every 4 (four) hours., Disp: , Rfl:  .  levothyroxine (SYNTHROID, LEVOTHROID) 25 MCG tablet, Take 25 mcg by mouth daily before breakfast. , Disp: , Rfl: 2 .  metoprolol succinate (TOPROL-XL) 50 MG 24 hr tablet, TAKE 1 TABLET (50 MG TOTAL) BY MOUTH DAILY. TAKE WITH OR IMMEDIATELY FOLLOWING A MEAL., Disp: 90 tablet, Rfl: 1 .  polyethylene glycol powder (GLYCOLAX/MIRALAX) powder, Take 17 g by mouth daily. Dissolve 1 capful in 8 oz of liquid daily PRN (Patient taking differently: Take 17 g by mouth daily as needed (constipation.). ), Disp: 3350 g, Rfl: 1 .  vitamin C (ASCORBIC ACID) 500 MG tablet, Take 500 mg by mouth daily., Disp: , Rfl:    Allergies  Allergen Reactions  . Anaprox [Naproxen Sodium] Rash     Social History   Tobacco Use  . Smoking status: Never Smoker  . Smokeless tobacco: Never Used  . Tobacco comment: smoking cessation materials not required  Vaping Use  . Vaping Use: Never used  Substance Use Topics  . Alcohol use: No  . Drug use: No      Chart Review Today: ***  Review of Systems  Constitutional: Negative.  Negative for activity change, appetite change, fatigue and unexpected weight change.  HENT: Negative.   Eyes: Negative.   Respiratory: Negative.  Negative for shortness of breath.   Cardiovascular: Negative.  Negative for chest pain, palpitations and leg swelling.  Gastrointestinal: Negative.  Negative for abdominal pain and blood in stool.  Endocrine: Negative.   Genitourinary: Negative.   Musculoskeletal: Negative.  Negative for  arthralgias, gait problem, joint swelling and myalgias.  Skin: Negative.  Negative for pallor and rash.  Allergic/Immunologic: Negative.   Neurological: Negative.  Negative for syncope and weakness.  Hematological: Negative.   Psychiatric/Behavioral: Negative.  Negative for dysphoric mood, self-injury and suicidal ideas. The patient is not nervous/anxious.   All other systems reviewed and are negative.      Objective:   Vitals:   12/04/19 1513  Pulse: 74  Resp: 16  Temp: 98.6 F (37 C)  TempSrc: Oral  SpO2: 96%  Weight: 205 lb 9.6 oz (93.3 kg)  Height: 5\' 9"  (1.753 m)    Body mass index is 30.36 kg/m.  Physical Exam Vitals and nursing note reviewed.  Constitutional:      General: She is not in acute distress.    Appearance: Normal appearance. She is well-developed. She is not ill-appearing, toxic-appearing or diaphoretic.     Interventions: Face mask in place.  HENT:     Head: Normocephalic and atraumatic.     Right Ear: External ear normal.     Left Ear: External ear normal.  Eyes:     General: Lids are normal. No scleral icterus.       Right eye: No discharge.        Left eye: No discharge.     Conjunctiva/sclera: Conjunctivae normal.  Neck:     Trachea: Phonation normal. No tracheal deviation.  Cardiovascular:     Rate and Rhythm: Normal rate and regular rhythm.     Pulses: Normal pulses.          Radial pulses are 2+ on the right side and 2+ on the left side.       Posterior tibial pulses are 2+ on the right side and 2+ on the left side.     Heart sounds: Normal heart sounds. No murmur heard.  No friction rub. No gallop.   Pulmonary:     Effort: Pulmonary effort is normal. No respiratory distress.     Breath sounds: Normal breath sounds. No stridor. No wheezing, rhonchi or rales.  Chest:     Chest wall: No tenderness.  Abdominal:     General: Bowel sounds are normal. There is no distension.     Palpations: Abdomen is soft.  Musculoskeletal:     Right  lower leg: No edema.     Left lower leg: No edema.  Skin:    General: Skin is warm and dry.     Coloration: Skin is not jaundiced or pale.     Findings: No rash.  Neurological:     Mental Status: She is alert.     Motor: No abnormal muscle tone.     Gait: Gait normal.  Psychiatric:        Mood and Affect: Mood normal.        Speech: Speech normal.  Behavior: Behavior normal.      Results for orders placed or performed during the hospital encounter of 02/04/19  SARS CORONAVIRUS 2 (TAT 6-24 HRS) Nasopharyngeal Nasopharyngeal Swab   Specimen: Nasopharyngeal Swab  Result Value Ref Range   SARS Coronavirus 2 NEGATIVE NEGATIVE       Assessment & Plan:   Cathrine Muster, CMA 12/04/19 3:15 PM

## 2019-12-04 NOTE — Patient Instructions (Addendum)
Last Note I can find from Dr. Ellyn Hack:  "PAF (paroxysmal atrial fibrillation) This does seem like a chronic diagnosis for her. I have no concept of water to A. fib burden is. I went through the explanation of what atrial fibrillation is mechanistically, and the concerns of heart failure versus stroke.  I spent probably 20 minutes going over the various options falling on her CHA2DS2Vasc score of 59 (age, female sex, and hypertension).   The final issue is that she is at risk for stroke and should consider being on full anticoagulation with either a NOAC or warfarin. She is very reluctant to consider doing this, stating that she was to stay as conservative as possible.  The final issue is that she is at risk for stroke and should consider being on full anticoagulation with either a NOAC or warfarin. She is very reluctant to consider doing this, stating that she was to stay as conservative as possible.  My overall recommendation was for her to wear an event monitor for a month in order for [place] to determine her A. fib burden. If high, this was used more toward falling evaluation, however if she has very rare/bleeding episodes, while not favorable, aspirin would be likely be less under treatment.  Plan: Continue metoprolol, start with baby aspirin today  LexiScan Myoview to assess for potential ischemic etiology of A. fib given her age and hypertension. Using LexiScan to avoid having to hold her beta blocker.  30 day event monitor (provided she will wear it)  2-D echocardiogram to assess LV function and chamber sizes, most notably the left atrial size.  If she remains relatively asymptomatic with short bursts, my recommendation would be to simply stay with rate control as opposed to rhythm control."   Consider starting lipitor daily at bedtime to help lower cholesterol and lower risk of having a heart attack or stroke.  Lab Results  Component Value Date   CHOL 207 (H) 01/24/2019   HDL 73  01/24/2019   LDLCALC 119 (H) 01/24/2019   TRIG 61 01/24/2019   CHOLHDL 2.8 01/24/2019   The 10-year ASCVD risk score Mikey Bussing DC Jr., et al., 2013) is: 15.6%   Values used to calculate the score:     Age: 74 years     Sex: Female     Is Non-Hispanic African American: Yes     Diabetic: No     Tobacco smoker: No     Systolic Blood Pressure: 283 mmHg     Is BP treated: Yes     HDL Cholesterol: 73 mg/dL     Total Cholesterol: 207 mg/dL  See handouts regarding statin medications and hyperlipidemia    Recommendations on cholesterol and starting statins.  There is a benefit from LDL-C (bad cholesterol) lowering with statin therapy at virtually all levels of cardiovascular risk.  If statin therapy had no side effects and caused no financial burden, it might be reasonable to recommend it to virtually all at-risk individuals, similar to a healthy diet and exercise  It is this good of a medication!!  It reduces risk in almost everyone.   There are possible side effects with ALL medications and with statins there is a small subset of the population who may not metabolize it well, which causing muscle aches as a side effect (~5%).  We monitor for this, can test for this, and usually are careful to work with you to get a medication that gives you the benefits with minimal side effects.  Some  people are sensitive to medications in general and we try to use the highest dose tolerated to give the most benefit.   [place] [facility] cholesterol and statin guidelines are as follows:  In adults 53 to 76 years of age without diabetes mellitus and with LDL-C levels ?42, at a 10-year atherosclerotic cardiovascular disease risk of ?7.5 percent, start a moderate-intensity statin if a discussion of treatment options favors statin therapy  If LDL is >160, statins are indicated.  Patients with other significant risk factors would also benefit from statin.  Some of these factors  include a family history of premature cardiovascular disease, chronic kidney disease, or chronic inflammatory disorder (such as chronic human immunodeficiency viral infection).   Can get more information at the following website:  PromotionalLoans.co.za

## 2020-01-12 NOTE — Progress Notes (Deleted)
Name: Rebecca Anthony   MRN: [id number]    DOB: 1946-03-13   Date:01/12/2020       Progress Note  No chief complaint on file.    Subjective:   Rebecca Anthony is a 74 y.o. female, presents to clinic for   Hypertension:  Currently managed on Metoprolol 50mg  qd Pt reports *** med compliance and denies any SE.   Blood pressure today is *** controlled. BP Readings from Last 3 Encounters:  12/04/19 118/76  04/29/19 (!) 148/92  02/07/19 139/87   Pt denies CP, SOB, exertional sx, LE edema, palpitation, Ha's, visual disturbances, lightheadedness, hypotension, syncope. Dietary efforts for BP?  ***   Hyperlipidemia: Currently treated with Lipitor 10mg  qd , pt reports *** med compliance Last Lipids: Lab Results  Component Value Date   CHOL 207 (H) 01/24/2019   HDL 73 01/24/2019   LDLCALC 119 (H) 01/24/2019   TRIG 61 01/24/2019   CHOLHDL 2.8 01/24/2019   - {ACTIONS;DENIES/REPORTS:[id number]::"Denies"}: Chest pain, shortness of breath, myalgias, claudication   Current Outpatient Medications:  .  aspirin 81 MG tablet, Take 1 tablet (81 mg total) by mouth daily. Do NOT take with Xarelto (just one or the other), Disp: 30 tablet, Rfl:  .  atorvastatin (LIPITOR) 10 MG tablet, Take 1 tablet (10 mg total) by mouth at bedtime., Disp: 90 tablet, Rfl: 3 .  calcium carbonate (OSCAL) 1500 (600 Ca) MG TABS tablet, Take by mouth., Disp: , Rfl:  .  gabapentin (NEURONTIN) 100 MG capsule, Take 100 mg by mouth 3 (three) times daily. (Patient not taking: Reported on 12/04/2019), Disp: , Rfl:  .  HORSE CHESTNUT PO, Take 1 tablet by mouth daily., Disp: , Rfl:  .  HYDROcodone-acetaminophen (NORCO/VICODIN) 5-325 MG tablet, Take 1 tablet by mouth every 4 (four) hours., Disp: , Rfl:  .  levothyroxine (SYNTHROID, LEVOTHROID) 25 MCG tablet, Take 25 mcg by mouth daily before breakfast. , Disp: , Rfl: 2 .  metoprolol succinate (TOPROL-XL) 50 MG 24 hr tablet, TAKE 1 TABLET (50 MG TOTAL) BY MOUTH DAILY. TAKE WITH OR  IMMEDIATELY FOLLOWING A MEAL., Disp: 90 tablet, Rfl: 1 .  polyethylene glycol powder (GLYCOLAX/MIRALAX) powder, Take 17 g by mouth daily. Dissolve 1 capful in 8 oz of liquid daily PRN (Patient taking differently: Take 17 g by mouth daily as needed (constipation.). ), Disp: 3350 g, Rfl: 1 .  vitamin C (ASCORBIC ACID) 500 MG tablet, Take 500 mg by mouth daily., Disp: , Rfl:   Patient Active Problem List   Diagnosis Date Noted  . Chronic atrial fibrillation ([place]) 12/04/2019  . Aortic atherosclerosis ([place]) 12/04/2019  . Dyslipidemia 05/30/2018  . Prediabetes 05/14/2018  . Thyroid nodule 01/28/2018  . Gallbladder polyp 01/28/2018  . Cyst of pancreas 01/28/2018  . 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 Foraminal stenosis of thoracic region 01/28/2018  . Idiopathic scoliosis of thoracolumbar spine 11/28/2017  . Sigmoid diverticulosis 11/28/2017  . Chronic ankle pain (Fourth Area of Pain) (Left) 11/07/2017  . Chronic abdominal pain (LLQ) (Primary Area of Pain) (Left) 10/31/2017  . Chronic low back pain (Secondary Area of Pain) (Bilateral) (L>R) 10/31/2017  . Chronic pain syndrome 10/31/2017  . Chronic upper back pain [facility] Area of Pain) (Bilateral) (L>R) 10/31/2017  . Obesity (BMI 30.0-34.9) 09/13/2017  . Hematuria, microscopic 07/16/2017  . Hypothyroidism 02/23/2017  . LLQ abdominal pain 09/08/2016  . Osteopenia of left femoral neck 03/23/2016  . Onychomycosis 03/23/2016  . Abnormal EKG 08/29/2013  . PAF (paroxysmal atrial fibrillation) ([place])   .  Hypertension     Past Surgical History:  Procedure Laterality Date  . ABDOMINAL HYSTERECTOMY    . ANKLE SURGERY Left    Torned tendon  . APPENDECTOMY    . AV FISTULA REPAIR    . BUNIONECTOMY    . CATARACT EXTRACTION W/PHACO Right 01/02/2019   Procedure: CATARACT EXTRACTION PHACO AND INTRAOCULAR LENS PLACEMENT ([place]) RIGHT;  Surgeon: Birder Robson, MD;  Location: [facility] ORS;  Service: Ophthalmology;  Laterality: Right;  [place] 00:42 CDE  7.91 fluid pack lot # [id number]  . CATARACT EXTRACTION W/PHACO Left 02/07/2019   Procedure: CATARACT EXTRACTION PHACO AND INTRAOCULAR LENS PLACEMENT ([place]) LEFT;  Surgeon: Birder Robson, MD;  Location: [facility] ORS;  Service: Ophthalmology;  Laterality: Left;  [place] 00:36.3CDE 5.01Fluid Pack Lot # [id number]  . COLONOSCOPY    . FOOT SURGERY Left   . VAGINAL HYSTERECTOMY    . VESICOVAGINAL FISTULA CLOSURE W/ TAH      Family History  Problem Relation Age of Onset  . Diabetes Mother   . Hypertension Mother   . Heart failure Mother   . Heart disease Father   . Hypertension Father   . Thyroid disease Sister   . Healthy Sister   . Breast cancer Neg Hx     Social History   Tobacco Use  . Smoking status: Never Smoker  . Smokeless tobacco: Never Used  . Tobacco comment: smoking cessation materials not required  Vaping Use  . Vaping Use: Never used  Substance Use Topics  . Alcohol use: No  . Drug use: No     Allergies  Allergen Reactions  . Anaprox [Naproxen Sodium] Rash    Health Maintenance  Topic Date Due  . MAMMOGRAM  03/26/2019  . INFLUENZA VACCINE  11/16/2019  . TETANUS/TDAP  05/24/2020  . DEXA SCAN  Completed  . COVID-19 Vaccine  Completed  . Hepatitis C Screening  Completed  . PNA vac Low Risk Adult  Completed    Chart Review Today: ***  Review of Systems   Objective:   There were no vitals filed for this visit.  There is no height or weight on file to calculate BMI.  Physical Exam      Assessment & Plan:   ***  No follow-ups on file.   Cathrine Muster, CMA 01/12/20 2:38 PM

## 2020-01-13 ENCOUNTER — Ambulatory Visit: Payer: Self-pay | Admitting: Family Medicine

## 2020-01-26 ENCOUNTER — Encounter: Payer: Self-pay | Admitting: Podiatry

## 2020-01-26 ENCOUNTER — Ambulatory Visit: Payer: Medicare Other | Admitting: Podiatry

## 2020-01-26 ENCOUNTER — Other Ambulatory Visit: Payer: Self-pay

## 2020-01-26 DIAGNOSIS — Q828 Other specified congenital malformations of skin: Secondary | ICD-10-CM | POA: Diagnosis not present

## 2020-01-26 DIAGNOSIS — B351 Tinea unguium: Secondary | ICD-10-CM | POA: Diagnosis not present

## 2020-01-26 DIAGNOSIS — M79676 Pain in unspecified toe(s): Secondary | ICD-10-CM | POA: Diagnosis not present

## 2020-01-26 NOTE — Progress Notes (Signed)
She presents today chief complaint of painfully elongated toenails and calluses plantar aspect left foot.  Objective: Vital signs are stable she is alert oriented x3 pulses are palpable.  Venous insufficiency with venous stasis bilateral.  Toenails are long thick yellow dystrophic-like mycotic hallux left over right.  Reactive hyperkeratosis subfirst and fifth bilateral.  Assessment: Pain in limb secondary to onychomycosis and porokeratosis.  Plan: Debridement of all reactive hyperkeratotic tissue and debridement of toenails 1 through 5 bilateral.

## 2020-02-05 ENCOUNTER — Ambulatory Visit: Payer: Medicare Other | Admitting: Family Medicine

## 2020-02-09 ENCOUNTER — Telehealth: Payer: Self-pay | Admitting: Family Medicine

## 2020-02-09 NOTE — Telephone Encounter (Signed)
Medication: levothyroxine Wilmer Floor, LEVOTHROID) 25 MCG tablet [[id number]]  Apt February 23, 2020  Has the patient contacted their pharmacy? YES  (Agent: If yes, when and what did the pharmacy advise?)  Preferred Pharmacy (with phone number or street name): [facility] [phone]  Agent: Please be advised that RX refills may take up to 3 business days. We ask that you follow-up with your pharmacy.

## 2020-02-10 NOTE — Telephone Encounter (Signed)
Order sent on 10/19

## 2020-02-17 ENCOUNTER — Ambulatory Visit: Payer: Medicare Other

## 2020-02-23 ENCOUNTER — Ambulatory Visit: Payer: Medicare Other | Admitting: Family Medicine

## 2020-03-05 ENCOUNTER — Ambulatory Visit: Payer: Medicare Other | Admitting: Family Medicine

## 2020-03-09 ENCOUNTER — Ambulatory Visit: Payer: Medicare Other | Admitting: Family Medicine

## 2020-03-09 ENCOUNTER — Other Ambulatory Visit: Payer: Self-pay | Admitting: Family Medicine

## 2020-03-09 NOTE — Telephone Encounter (Signed)
Medication Refill - Medication: Levothyroxine   Has the patient contacted their pharmacy? No. (Agent: If no, request that the patient contact the pharmacy for the refill.) (Agent: If yes, when and what did the pharmacy advise?)  Preferred Pharmacy (with phone number or street name[facility] [address]   Agent: Please be advised that RX refills may take up to 3 business days. We ask that you follow-up with your pharmacy.

## 2020-03-09 NOTE — Telephone Encounter (Signed)
Requested medication (s) are due for refill today: Yes  Requested medication (s) are on the active medication list: Yes  Last refill:  02/14/17  Future visit scheduled: Yes  Notes to clinic:  Prescription has expired. Historical provider.   Requested Prescriptions  Pending Prescriptions Disp Refills   levothyroxine (SYNTHROID) 25 MCG tablet  2    Sig: Take 1 tablet (25 mcg total) by mouth daily before breakfast.      Endocrinology:  Hypothyroid Agents Failed - 03/09/2020  1:03 PM      Failed - TSH needs to be rechecked within 3 months after an abnormal result. Refill until TSH is due.      Failed - TSH in normal range and within 360 days    TSH  Date Value Ref Range Status  01/24/2019 2.95 0.40 - 4.50 mIU/L Final          Passed - Valid encounter within last 12 months    Recent Outpatient Visits           3 months ago Chronic atrial fibrillation [facility])   [facility] Delsa Grana, PA-C   9 months ago Left low back pain, unspecified chronicity, unspecified whether sciatica present   [facility] Delsa Grana, PA-C   10 months ago Left hip pain   [facility] Delsa Grana, PA-C   1 year ago Essential hypertension   [facility] Delsa Grana, PA-C   1 year ago PAF (paroxysmal atrial fibrillation) [facility])   [place], NP       Future Appointments             In 1 week Delsa Grana, PA-C [facility], [place]   In 4 weeks  [place]

## 2020-03-10 MED ORDER — LEVOTHYROXINE SODIUM 50 MCG PO TABS: 50.0000 ug | ORAL_TABLET | Freq: Every day | ORAL | 0 refills | Status: DC

## 2020-03-10 NOTE — Telephone Encounter (Signed)
Patient stated she is now taking 46mcg. Can you call in a 15 to 30 days supply until her appointment in 2 weeks

## 2020-03-10 NOTE — Addendum Note (Signed)
Addended by: Delsa Grana on: 03/10/2020 01:53 PM   Modules accepted: Orders

## 2020-03-10 NOTE — Telephone Encounter (Signed)
Patient's medication had not been previously prescribed here for some time She did have one visit at [facility] clinic with Dr. Carlean Jews, after he did lab work about 6 months ago he said TSH was higher than what he likes and he increased her dose from 25 mcg to 50 mcg, she has not done follow-up here or at [place] clinic since that time  Will refill 50 mcg dose and patient is due for follow-up in office next week and we will check her TSH and adjust medications as needed then  2 week courtesy refill sent in  Rebecca Anthony, [place]

## 2020-03-19 ENCOUNTER — Other Ambulatory Visit: Payer: Self-pay

## 2020-03-19 ENCOUNTER — Encounter: Payer: Self-pay | Admitting: Family Medicine

## 2020-03-19 ENCOUNTER — Ambulatory Visit: Payer: Medicare Other | Admitting: Family Medicine

## 2020-03-19 VITALS — BP 128/72 | HR 62 | Temp 98.2°F | Resp 16 | Ht 69.0 in | Wt 206.0 lb

## 2020-03-19 DIAGNOSIS — Z23 Encounter for immunization: Secondary | ICD-10-CM | POA: Diagnosis not present

## 2020-03-19 DIAGNOSIS — E039 Hypothyroidism, unspecified: Secondary | ICD-10-CM

## 2020-03-19 DIAGNOSIS — R21 Rash and other nonspecific skin eruption: Secondary | ICD-10-CM

## 2020-03-19 DIAGNOSIS — I700 Atherosclerosis of aorta: Secondary | ICD-10-CM | POA: Diagnosis not present

## 2020-03-19 DIAGNOSIS — I480 Paroxysmal atrial fibrillation: Secondary | ICD-10-CM

## 2020-03-19 DIAGNOSIS — R3129 Other microscopic hematuria: Secondary | ICD-10-CM

## 2020-03-19 DIAGNOSIS — R7303 Prediabetes: Secondary | ICD-10-CM

## 2020-03-19 DIAGNOSIS — I10 Essential (primary) hypertension: Secondary | ICD-10-CM

## 2020-03-19 DIAGNOSIS — Z5181 Encounter for therapeutic drug level monitoring: Secondary | ICD-10-CM

## 2020-03-19 DIAGNOSIS — Z1231 Encounter for screening mammogram for malignant neoplasm of breast: Secondary | ICD-10-CM

## 2020-03-19 DIAGNOSIS — G894 Chronic pain syndrome: Secondary | ICD-10-CM

## 2020-03-19 DIAGNOSIS — Z780 Asymptomatic menopausal state: Secondary | ICD-10-CM

## 2020-03-19 DIAGNOSIS — M4125 Other idiopathic scoliosis, thoracolumbar region: Secondary | ICD-10-CM

## 2020-03-19 DIAGNOSIS — E785 Hyperlipidemia, unspecified: Secondary | ICD-10-CM

## 2020-03-19 DIAGNOSIS — M85852 Other specified disorders of bone density and structure, left thigh: Secondary | ICD-10-CM

## 2020-03-19 MED ORDER — MUPIROCIN 2 % EX OINT: 1.0000 "application " | TOPICAL_OINTMENT | Freq: Two times a day (BID) | CUTANEOUS | 1 refills | Status: DC | PRN

## 2020-03-19 NOTE — Patient Instructions (Addendum)
I would like to have you follow-up with cardiology regarding your past cardiac history, A. fib and would like to have you again here recommended management from the specialist  I have ordered a bone density scan which you can do after the first of the year, and you are also due for your mammogram you can call and schedule both of them together if you would like:  [facility] at [facility] [place],  [place]  [id number] Get Driving Directions Main: [place] Maintenance  Topic Date Due   Mammogram  03/26/2019   Flu Shot  11/16/2019   Tetanus Vaccine  05/24/2020   DEXA scan (bone density measurement)  Completed   COVID-19 Vaccine  Completed    Hepatitis C: One time screening is recommended by Center for Disease Control  (CDC) for  adults born from [birth year] through 1967.   Completed   Pneumonia vaccines  Completed   Lab Results  Component Value Date   CHOL 207 (H) 01/24/2019   CHOL 218 (H) 12/31/2017   CHOL 237 (H) 04/14/2016   Lab Results  Component Value Date   HDL 73 01/24/2019   HDL 74 12/31/2017   HDL 76 04/14/2016   Lab Results  Component Value Date   LDLCALC 119 (H) 01/24/2019   LDLCALC 128 (H) 12/31/2017   LDLCALC 142 (H) 04/14/2016   Lab Results  Component Value Date   TRIG 61 01/24/2019   TRIG 67 12/31/2017   TRIG 93 04/14/2016   Lab Results  Component Value Date   CHOLHDL 2.8 01/24/2019   CHOLHDL 2.9 12/31/2017   CHOLHDL 3.1 04/14/2016   No results found for: LDLDIRECT  The 10-year ASCVD risk score Mikey Bussing DC Jr., et al., 2013) is: 18.6%   Values used to calculate the score:     Age: 74 years     Sex: Female     Is Non-Hispanic African American: Yes     Diabetic: No     Tobacco smoker: No     Systolic Blood Pressure: 330 mmHg     Is BP treated: Yes     HDL Cholesterol: 73 mg/dL     Total Cholesterol: 207 mg/dL

## 2020-03-19 NOTE — Progress Notes (Signed)
Name: Rebecca Anthony   MRN: [id number]    DOB: [birth year]   Date:03/19/2020       Progress Note  Chief Complaint  Patient presents with  . Hyperlipidemia  . Hypothyroidism  . Hypertension     Subjective:   Rebecca Anthony is a 74 y.o. female, presents to clinic for routine f/u  HTN - on metoprolol  BP Readings from Last 3 Encounters:  12/04/19 118/76  04/29/19 (!) 148/92  02/07/19 139/87   Hx of PAF, on BB, has previously consulted with cardiology and refused anticoagulants several times by several specialists and PCP's Previously reviewed risk of stroke   Hyperlipidemia: Did not start the lipitor medication- she wants to check her cholesterol again and see how she was doing before she starts a medication Last Lipids: Lab Results  Component Value Date   CHOL 207 (H) 01/24/2019   HDL 73 01/24/2019   LDLCALC 119 (H) 01/24/2019   TRIG 61 01/24/2019   CHOLHDL 2.8 01/24/2019   - Denies: Chest pain, shortness of breath, myalgias, claudication Agreed to try statin - due for recheck of labs today  Hypothyroidism: Pt went to [facility] clinic and Dr. Carlean Jews had increased her levothyroxine dose from 25 to 50 mcg after labs showed TSH ~4 meds have been refilled but labs never rechecked  Current Medication Regimen: 50 mcg synthroid  Current Symptoms: denies fatigue, weight changes, heat/cold intolerance, bowel/skin changes or CVS symptoms Most recent results are below; we will be repeating labs today. Lab Results  Component Value Date   TSH 2.95 01/24/2019   Osteoporosis - due for f/up bone scan, on Vit D and calcium supplements  Chronic back pain and scoliosis - had some improvement with pain and mobility after working with ortho/PT  Chronic abd pain - no known cause     Current Outpatient Medications:  .  aspirin 81 MG tablet, Take 1 tablet (81 mg total) by mouth daily. Do NOT take with Xarelto (just one or the other), Disp: 30 tablet, Rfl:  .  atorvastatin (LIPITOR) 10 MG  tablet, Take 1 tablet (10 mg total) by mouth at bedtime., Disp: 90 tablet, Rfl: 3 .  calcium carbonate (OSCAL) 1500 (600 Ca) MG TABS tablet, Take by mouth., Disp: , Rfl:  .  gabapentin (NEURONTIN) 100 MG capsule, Take 100 mg by mouth 3 (three) times daily. (Patient not taking: Reported on 12/04/2019), Disp: , Rfl:  .  HORSE CHESTNUT PO, Take 1 tablet by mouth daily., Disp: , Rfl:  .  HYDROcodone-acetaminophen (NORCO/VICODIN) 5-325 MG tablet, Take 1 tablet by mouth every 4 (four) hours., Disp: , Rfl:  .  levothyroxine (SYNTHROID) 50 MCG tablet, Take 1 tablet (50 mcg total) by mouth daily., Disp: 14 tablet, Rfl: 0 .  metoprolol succinate (TOPROL-XL) 50 MG 24 hr tablet, TAKE 1 TABLET (50 MG TOTAL) BY MOUTH DAILY. TAKE WITH OR IMMEDIATELY FOLLOWING A MEAL., Disp: 90 tablet, Rfl: 1 .  polyethylene glycol powder (GLYCOLAX/MIRALAX) powder, Take 17 g by mouth daily. Dissolve 1 capful in 8 oz of liquid daily PRN (Patient taking differently: Take 17 g by mouth daily as needed (constipation.). ), Disp: 3350 g, Rfl: 1 .  vitamin C (ASCORBIC ACID) 500 MG tablet, Take 500 mg by mouth daily., Disp: , Rfl:   Patient Active Problem List   Diagnosis Date Noted  . Chronic atrial fibrillation ([place]) 12/04/2019  . Aortic atherosclerosis ([place]) 12/04/2019  . Chronic sciatica of left side 10/24/2019  . Dyslipidemia 05/30/2018  .  Prediabetes 05/14/2018  . Thyroid nodule 01/28/2018  . Gallbladder polyp 01/28/2018  . Cyst of pancreas 01/28/2018  . Foraminal stenosis of thoracic region 01/28/2018  . Idiopathic scoliosis of thoracolumbar spine 11/28/2017  . Chronic pain syndrome 10/31/2017  . Obesity (BMI 30.0-34.9) 09/13/2017  . Hematuria, microscopic 07/16/2017  . Hypothyroidism 02/23/2017  . Osteopenia of left femoral neck 03/23/2016  . PAF (paroxysmal atrial fibrillation) ([place])   . Hypertension     Past Surgical History:  Procedure Laterality Date  . ABDOMINAL HYSTERECTOMY    . ANKLE SURGERY Left    Torned  tendon  . APPENDECTOMY    . AV FISTULA REPAIR    . BUNIONECTOMY    . CATARACT EXTRACTION W/PHACO Right 01/02/2019   Procedure: CATARACT EXTRACTION PHACO AND INTRAOCULAR LENS PLACEMENT ([place]) RIGHT;  Surgeon: Birder Robson, MD;  Location: [facility] ORS;  Service: Ophthalmology;  Laterality: Right;  [place] 00:42 CDE 7.91 fluid pack lot # [id number]  . CATARACT EXTRACTION W/PHACO Left 02/07/2019   Procedure: CATARACT EXTRACTION PHACO AND INTRAOCULAR LENS PLACEMENT ([place]) LEFT;  Surgeon: Birder Robson, MD;  Location: [facility] ORS;  Service: Ophthalmology;  Laterality: Left;  [place] 00:36.3CDE 5.01Fluid Pack Lot # [id number]  . COLONOSCOPY    . FOOT SURGERY Left   . VAGINAL HYSTERECTOMY    . VESICOVAGINAL FISTULA CLOSURE W/ TAH      Family History  Problem Relation Age of Onset  . Diabetes Mother   . Hypertension Mother   . Heart failure Mother   . Heart disease Father   . Hypertension Father   . Thyroid disease Sister   . Healthy Sister   . Breast cancer Neg Hx     Social History   Tobacco Use  . Smoking status: Never Smoker  . Smokeless tobacco: Never Used  . Tobacco comment: smoking cessation materials not required  Vaping Use  . Vaping Use: Never used  Substance Use Topics  . Alcohol use: No  . Drug use: No     Allergies  Allergen Reactions  . Anaprox [Naproxen Sodium] Rash    Health Maintenance  Topic Date Due  . MAMMOGRAM  03/26/2019  . INFLUENZA VACCINE  11/16/2019  . TETANUS/TDAP  05/24/2020  . DEXA SCAN  Completed  . COVID-19 Vaccine  Completed  . Hepatitis C Screening  Completed  . PNA vac Low Risk Adult  Completed    Chart Review Today: I personally reviewed active problem list, medication list, allergies, family history, social history, health maintenance, notes from last encounter, lab results, imaging with the patient/caregiver today.   Review of Systems  10 Systems reviewed and are negative for acute change except as noted in the HPI.  Objective:   Vitals:    03/19/20 1426  BP: 128/72  Pulse: 62  Resp: 16  Temp: 98.2 F (36.8 C)  TempSrc: Oral  SpO2: 98%  Weight: 206 lb (93.4 kg)  Height: 5\' 9"  (1.753 m)    Body mass index is 30.42 kg/m.  Physical Exam Vitals and nursing note reviewed.  Constitutional:      General: She is not in acute distress.    Appearance: She is well-developed. She is obese. She is not ill-appearing, toxic-appearing or diaphoretic.     Interventions: Face mask in place.  HENT:     Head: Normocephalic and atraumatic.     Right Ear: External ear normal.     Left Ear: External ear normal.  Eyes:     General: Lids are normal. No scleral  icterus.       Right eye: No discharge.        Left eye: No discharge.     Conjunctiva/sclera: Conjunctivae normal.  Neck:     Trachea: Phonation normal. No tracheal deviation.  Cardiovascular:     Rate and Rhythm: Normal rate. Rhythm irregularly irregular. Frequent extrasystoles are present.    Pulses: Normal pulses.          Radial pulses are 2+ on the right side and 2+ on the left side.     Heart sounds: No murmur heard.  No friction rub.  Pulmonary:     Effort: Pulmonary effort is normal. No respiratory distress.     Breath sounds: Normal breath sounds. No stridor. No wheezing, rhonchi or rales.  Chest:     Chest wall: No tenderness.  Abdominal:     General: Bowel sounds are normal. There is no distension.     Palpations: Abdomen is soft.  Musculoskeletal:     Right lower leg: No edema.     Left lower leg: No edema.  Skin:    General: Skin is warm and dry.     Coloration: Skin is not jaundiced or pale.     Comments: Scattered round flat hyperpigmented areas to inner thighs and groin  Neurological:     Mental Status: She is alert. Mental status is at baseline.     Motor: No abnormal muscle tone.     Gait: Gait abnormal.  Psychiatric:        Mood and Affect: Mood normal.        Speech: Speech normal.        Behavior: Behavior normal.          Assessment & Plan:   1. PAF (paroxysmal atrial fibrillation) ([place]) She feels palpitations, does not have any signs of CHF  - CBC with Differential/Platelet - COMPLETE METABOLIC PANEL WITH GFR  2. Primary hypertension Stable, well controlled, BP at goal today on metoprolol only - COMPLETE METABOLIC PANEL WITH GFR  3. Aortic atherosclerosis ([place]) Encouraged her to take statin - COMPLETE METABOLIC PANEL WITH GFR - Lipid panel  4. Hypothyroidism, unspecified type Dose was increased earlier this year, no SE from dose change, due for labs - CBC with Differential/Platelet - COMPLETE METABOLIC PANEL WITH GFR - TSH  5. Dyslipidemia She has not yet tried statin Educated again about indications and her risk  Printed for her Advised her again that my medical recommendation is that she try it to determine her tolerance of the med and to lower her cholesterol and ASCVD risk - COMPLETE METABOLIC PANEL WITH GFR - Lipid panel  6. Prediabetes Recheck labs - COMPLETE METABOLIC PANEL WITH GFR - Hemoglobin A1c  7. Other idiopathic scoliosis, thoracolumbar region Per specialists  8. Osteopenia of left femoral neck Due for dexa - COMPLETE METABOLIC PANEL WITH GFR  9. Chronic pain syndrome Per specialists  10. Medication monitoring encounter  - CBC with Differential/Platelet - COMPLETE METABOLIC PANEL WITH GFR - Lipid panel - TSH  11. Postmenopausal estrogen deficiency  - DG Bone Density; Future  12. Encounter for screening mammogram for malignant neoplasm of breast  - MM 3D SCREEN BREAST BILATERAL; Future  13. Hematuria, microscopic  - Urinalysis, Routine w reflex microscopic   14. Need for influenza vaccination  - Flu Vaccine QUAD High Dose(Fluad)  15. Rash and nonspecific skin eruption Mupirocin for foliculitis, encouraged exfoliation, avoiding friction if able, f/up as needed - offered dermatology referral back  to who previously managed   Return in  about 4 months (around 07/18/2020) for Routine follow-up.   Delsa Grana, PA-C 03/19/20 1:55 PM

## 2020-03-20 LAB — URINALYSIS, ROUTINE W REFLEX MICROSCOPIC
Bilirubin Urine: NEGATIVE
Glucose, UA: NEGATIVE
Hgb urine dipstick: NEGATIVE
Ketones, ur: NEGATIVE
Leukocytes,Ua: NEGATIVE
Nitrite: NEGATIVE
Protein, ur: NEGATIVE
Specific Gravity, Urine: 1.024 (ref 1.001–1.03)
pH: 5.5 (ref 5.0–8.0)

## 2020-03-20 LAB — COMPLETE METABOLIC PANEL WITH GFR
AG Ratio: 1.9 (calc) (ref 1.0–2.5)
ALT: 16 U/L (ref 6–29)
AST: 18 U/L (ref 10–35)
Albumin: 4.5 g/dL (ref 3.6–5.1)
Alkaline phosphatase (APISO): 69 U/L (ref 37–153)
BUN/Creatinine Ratio: 29 (calc) — ABNORMAL HIGH (ref 6–22)
BUN: 26 mg/dL — ABNORMAL HIGH (ref 7–25)
CO2: 31 mmol/L (ref 20–32)
Calcium: 10 mg/dL (ref 8.6–10.4)
Chloride: 104 mmol/L (ref 98–110)
Creat: 0.91 mg/dL (ref 0.60–0.93)
GFR, Est African American: 71 mL/min/{1.73_m2} (ref >=60)
GFR, Est Non African American: 61 mL/min/{1.73_m2} (ref >=60)
Globulin: 2.4 g/dL (calc) (ref 1.9–3.7)
Glucose, Bld: 83 mg/dL (ref 65–99)
Potassium: 4.8 mmol/L (ref 3.5–5.3)
Sodium: 140 mmol/L (ref 135–146)
Total Bilirubin: 0.8 mg/dL (ref 0.2–1.2)
Total Protein: 6.9 g/dL (ref 6.1–8.1)

## 2020-03-20 LAB — CBC WITH DIFFERENTIAL/PLATELET
Absolute Monocytes: 745 cells/uL (ref 200–950)
Basophils Absolute: 31 cells/uL (ref 0–200)
Basophils Relative: 0.6 %
Eosinophils Absolute: 82 cells/uL (ref 15–500)
Eosinophils Relative: 1.6 %
HCT: 44 % (ref 35.0–45.0)
Hemoglobin: 14.4 g/dL (ref 11.7–15.5)
Lymphs Abs: 1484 cells/uL (ref 850–3900)
MCH: 28.1 pg (ref 27.0–33.0)
MCHC: 32.7 g/dL (ref 32.0–36.0)
MCV: 85.9 fL (ref 80.0–100.0)
MPV: 9.2 fL (ref 7.5–12.5)
Monocytes Relative: 14.6 %
Neutro Abs: 2759 cells/uL (ref 1500–7800)
Neutrophils Relative %: 54.1 %
Platelets: 274 10*3/uL (ref 140–400)
RBC: 5.12 10*6/uL — ABNORMAL HIGH (ref 3.80–5.10)
RDW: 12.2 % (ref 11.0–15.0)
Total Lymphocyte: 29.1 %
WBC: 5.1 10*3/uL (ref 3.8–10.8)

## 2020-03-20 LAB — LIPID PANEL
Cholesterol: 210 mg/dL — ABNORMAL HIGH (ref <200)
HDL: 72 mg/dL (ref >=50)
LDL Cholesterol (Calc): 121 mg/dL (calc) — ABNORMAL HIGH
Non-HDL Cholesterol (Calc): 138 mg/dL (calc) — ABNORMAL HIGH (ref <130)
Total CHOL/HDL Ratio: 2.9 (calc) (ref <5.0)
Triglycerides: 75 mg/dL (ref <150)

## 2020-03-20 LAB — HEMOGLOBIN A1C
Hgb A1c MFr Bld: 5.5 % of total Hgb (ref <5.7)
Mean Plasma Glucose: 111 (calc)
eAG (mmol/L): 6.2 (calc)

## 2020-03-20 LAB — TSH: TSH: 1.28 mIU/L (ref 0.40–4.50)

## 2020-03-22 ENCOUNTER — Other Ambulatory Visit: Payer: Self-pay | Admitting: Family Medicine

## 2020-03-22 ENCOUNTER — Telehealth: Payer: Self-pay | Admitting: *Deleted

## 2020-03-22 NOTE — Chronic Care Management (AMB) (Signed)
  Chronic Care Management   Note  03/22/2020 Name: Rebecca Anthony MRN: [id number] DOB: 1946/03/30  Rebecca Anthony is a 74 y.o. year old female who is a primary care patient of Delsa Grana, [place]. I reached out to Rebecca Anthony by phone today in response to a referral sent by Ms. Cortne B Cornell's PCP, Delsa Grana, PA-C.     Ms. Esqueda was given information about Chronic Care Management services today including:  1. CCM service includes personalized support from designated clinical staff supervised by her physician, including individualized plan of care and coordination with other care providers 2. 24/7 contact phone numbers for assistance for urgent and routine care needs. 3. Service will only be billed when office clinical staff spend 20 minutes or more in a month to coordinate care. 4. Only one practitioner may furnish and bill the service in a calendar month. 5. The patient may stop CCM services at any time (effective at the end of the month) by phone call to the office staff. 6. The patient will be responsible for cost sharing (co-pay) of up to 20% of the service fee (after annual deductible is met).  Patient agreed to services and verbal consent obtained.   Follow up plan: Telephone appointment with care management team member scheduled for: 03/24/2020  [place] Management

## 2020-03-22 NOTE — Chronic Care Management (AMB) (Signed)
  Chronic Care Management   Outreach Note  03/22/2020 Name: Rebecca Anthony MRN: [id number] DOB: 13-Aug-1945  Rebecca Anthony is a 74 y.o. year old female who is a primary care patient of Delsa Grana, [place]. I reached out to Rebecca Anthony by phone today in response to a referral sent by Ms. Kaniesha B Hausen's PCP, Delsa Grana, PA-C     An unsuccessful telephone outreach was attempted today. The patient was referred to the case management team for assistance with care management and care coordination.   Follow Up Plan: A HIPAA compliant phone message was left for the patient providing contact information and requesting a return call. The care management team will reach out to the patient again over the next 7 days. If patient returns call to provider office, please advise to call [place] at [phone].  [place] Management

## 2020-03-24 ENCOUNTER — Encounter: Payer: Self-pay | Admitting: Emergency Medicine

## 2020-03-24 ENCOUNTER — Ambulatory Visit: Payer: Medicare Other

## 2020-03-24 ENCOUNTER — Other Ambulatory Visit: Payer: Self-pay | Admitting: Emergency Medicine

## 2020-03-24 DIAGNOSIS — I10 Essential (primary) hypertension: Secondary | ICD-10-CM

## 2020-03-24 DIAGNOSIS — E785 Hyperlipidemia, unspecified: Secondary | ICD-10-CM

## 2020-03-24 MED ORDER — LEVOTHYROXINE SODIUM 50 MCG PO TABS: 50.0000 ug | ORAL_TABLET | Freq: Every day | ORAL | 1 refills | Status: DC

## 2020-03-24 NOTE — Chronic Care Management (AMB) (Signed)
Chronic Care Management   Initial Visit Note  03/24/2020 Name: Rebecca Anthony MRN: [id number] DOB: [birth year]  Primary Care Provider: Delsa Grana, PA-C Reason for referral : Chronic Care Management   Rebecca Anthony is a 74 y.o. year old female who is a primary care patient of Rebecca Anthony, [place]. The CCM team was consulted for assistance with chronic disease management and care coordination. The initial telephonic outreach was conducted today.  Review of Rebecca Anthony' status, including review of consultants reports, relevant labs and test results was conducted today. Collaboration with appropriate care team members was performed as part of the comprehensive evaluation and provision of chronic care management services.      SDOH (Social Determinants of Health) assessments performed: Yes See Care Plan activities for detailed interventions related to SDOH  SDOH Interventions     Most Recent Value  SDOH Interventions  Transportation Interventions Intervention Not Indicated       Medications: Outpatient Encounter Medications as of 03/24/2020  Medication Sig Note  . aspirin 81 MG tablet Take 1 tablet (81 mg total) by mouth daily. Do NOT take with Xarelto (just one or the other)   . calcium carbonate (OSCAL) 1500 (600 Ca) MG TABS tablet Take by mouth.    Marland Kitchen HORSE CHESTNUT PO Take 1 tablet by mouth daily.   Marland Kitchen HYDROcodone-acetaminophen (NORCO/VICODIN) 5-325 MG tablet Take 1 tablet by mouth every 4 (four) hours.   Marland Kitchen levothyroxine (SYNTHROID) 50 MCG tablet Take 1 tablet (50 mcg total) by mouth daily.   . metoprolol succinate (TOPROL-XL) 50 MG 24 hr tablet TAKE 1 TABLET (50 MG TOTAL) BY MOUTH DAILY. TAKE WITH OR IMMEDIATELY FOLLOWING A MEAL.   . vitamin C (ASCORBIC ACID) 500 MG tablet Take 500 mg by mouth daily. 03/24/2020: Reports taking 108m daily.   .Marland Kitchenatorvastatin (LIPITOR) 10 MG tablet Take 1 tablet (10 mg total) by mouth at bedtime. (Patient not taking: Reported on 03/19/2020)   . gabapentin  (NEURONTIN) 100 MG capsule Take 100 mg by mouth 3 (three) times daily. (Patient not taking: Reported on 12/04/2019)   . mupirocin ointment (BACTROBAN) 2 % Apply 1 application topically 2 (two) times daily as needed. Apply to affected skin   . polyethylene glycol powder (GLYCOLAX/MIRALAX) powder Take 17 g by mouth daily. Dissolve 1 capful in 8 oz of liquid daily PRN (Patient taking differently: Take 17 g by mouth daily as needed (constipation.). )   . [DISCONTINUED] levothyroxine (SYNTHROID) 50 MCG tablet Take 1 tablet (50 mcg total) by mouth daily.    No facility-administered encounter medications on file as of 03/24/2020.     Objective:  BP Readings from Last 3 Encounters:  03/19/20 128/72  12/04/19 118/76  04/29/19 (!) 148/92     Lab Results  Component Value Date   CHOL 210 (H) 03/19/2020   HDL 72 03/19/2020   LDLCALC 121 (H) 03/19/2020   TRIG 75 03/19/2020   CHOLHDL 2.9 03/19/2020    Fall Risk  03/19/2020 12/04/2019 05/20/2019 04/29/2019 02/13/2019  Falls in the past year? 0 0 0 0 0  Number falls in past yr: 0 0 0 0 0  Injury with Fall? 0 0 0 0 0  Risk for fall due to : - - - - -  Risk for fall due to: Comment - - - - -  Follow up Falls evaluation completed Falls evaluation completed - - Falls prevention discussed    Goals Addressed  This Visit's Progress   . Chronic Disease Management       Current Barriers:  . Chronic Disease Management support and education needs related to Hypertension, Chronic Atrial Fibrillation and Dyslipidemia. (Hx of Chronic Pain Syndrome)  Nurse Case Manager Clinical Goal(s):  Over the next 120 days, patient: . Will not require hospitalization or emergency care d/t complications r/t chronic illnesses. . Will monitor BP and record readings. . Will continue compliance with recommended DASH diet. . Will take all medications as prescribed. . Will attend medical appointments as scheduled. . Will follow recommended safety measures to  prevent falls and injuries. Over the next 60 days, patient: . Will complete Bone Density Scan . Will schedule and complete mammogram. . Will schedule and complete routine Cardiology outreach.  Interventions:  . Inter-disciplinary care team collaboration (see longitudinal plan of care) . Reviewed medications and indications for use. Advised to take all medications as prescribed and notify provider if unable to tolerate regimen. Encouraged to contact the care management team with concerns regarding medication management or prescription costs.  . Discussed recommendations regarding Hypertension self-management. Encouraged to continue recommended DASH diet. Discussed established BP parameters and indications for notifying a provider. Encouraged to monitor BP a few times a week if unable to monitor daily and record readings.   . Provided information regarding safety and fall prevention measures. Encouraged to keep assistive device readily available to use when needed d/t chronic back discomfort and unstable gait. Encouraged to keep pathways clear and well lit. Reports completing Physical Therapy once a week. She has a cane available to use as needed. Denies recent falls.   . Reviewed pending/scheduled appointments. Encouraged to attend medical appointments as scheduled to prevent delays in care. Encouraged to contact the care management team with concerns regarding transportation.  . Discussed plans for ongoing care management follow up. Provided direct contact information.   Patient Self Care Activities:  . Self administers medications. . Calls pharmacy for medication refills. . Performs ADL's independently . Performs IADL's independently . Contacts provider office for new concerns or questions   Initial goal documentation        Rebecca Anthony was given information about Chronic Care Management services  including:  1. CCM service includes personalized support from designated clinical staff  supervised by her physician, including individualized plan of care and coordination with other care providers 2. 24/7 contact phone numbers for assistance for urgent and routine care needs. 3. Service will only be billed when office clinical staff spend 20 minutes or more in a month to coordinate care. 4. Only one practitioner may furnish and bill the service in a calendar month. 5. The patient may stop CCM services at any time (effective at the end of the month) by phone call to the office staff. 6. The patient will be responsible for cost sharing (co-pay) of up to 20% of the service fee (after annual deductible is met).  Patient agreed to services and verbal consent obtained.     PLAN A member of the care management team will follow-up with Mrs. Mcgonagle next month.    [name] [facility] Care Management [facility] [phone]

## 2020-03-29 NOTE — Patient Instructions (Addendum)
Thank you for allowing the Chronic Care Management team to participate in your care.   Goals Addressed            This Visit's Progress   . Chronic Disease Management       Current Barriers:  . Chronic Disease Management support and education needs related to Hypertension, Chronic Atrial Fibrillation and Dyslipidemia. (Hx of Chronic Pain Syndrome)  Nurse Case Manager Clinical Goal(s):  Over the next 120 days, patient: . Will not require hospitalization or emergency care d/t complications r/t chronic illnesses. . Will monitor BP and record readings. . Will continue compliance with recommended DASH diet. . Will take all medications as prescribed. . Will attend medical appointments as scheduled. . Will follow recommended safety measures to prevent falls and injuries. Over the next 60 days, patient: . Will complete Bone Density Scan . Will schedule and complete mammogram. . Will schedule and complete routine Cardiology outreach.  Interventions:  . Inter-disciplinary care team collaboration (see longitudinal plan of care) . Reviewed medications and indications for use. Advised to take all medications as prescribed and notify provider if unable to tolerate regimen. Encouraged to contact the care management team with concerns regarding medication management or prescription costs.  . Discussed recommendations regarding Hypertension self-management. Encouraged to continue recommended DASH diet. Discussed established BP parameters and indications for notifying a provider. Encouraged to monitor BP a few times a week if unable to monitor daily and record readings.   . Provided information regarding safety and fall prevention measures. Encouraged to keep assistive device readily available to use when needed d/t chronic back discomfort and unstable gait. Encouraged to keep pathways clear and well lit. Reports completing Physical Therapy once a week. She has a cane available to use as needed. Denies  recent falls.   . Reviewed pending/scheduled appointments. Encouraged to attend medical appointments as scheduled to prevent delays in care. Encouraged to contact the care management team with concerns regarding transportation.  . Discussed plans for ongoing care management follow up. Provided direct contact information.   Patient Self Care Activities:  . Self administers medications. . Calls pharmacy for medication refills. . Performs ADL's independently . Performs IADL's independently . Contacts provider office for new concerns or questions   Initial goal documentation        Rebecca Anthony was given information about Chronic Care Management services  including:  1. CCM service includes personalized support from designated clinical staff supervised by her physician, including individualized plan of care and coordination with other care providers 2. 24/7 contact phone numbers for assistance for urgent and routine care needs. 3. Service will only be billed when office clinical staff spend 20 minutes or more in a month to coordinate care. 4. Only one practitioner may furnish and bill the service in a calendar month. 5. The patient may stop CCM services at any time (effective at the end of the month) by phone call to the office staff. 6. The patient will be responsible for cost sharing (co-pay) of up to 20% of the service fee (after annual deductible is met).  Patient agreed to services and verbal consent obtained.    Rebecca Anthony verbalized understanding of the instructions discussed during the telephonic outreach today. Declined need for a mailed/printed copy of the instructions.   A member of the care management team will follow-up with Rebecca Anthony next month.    Rebecca Anthony [facility] Care Management [facility] [phone]

## 2020-03-30 ENCOUNTER — Ambulatory Visit: Payer: Medicare Other

## 2020-04-06 ENCOUNTER — Ambulatory Visit (INDEPENDENT_AMBULATORY_CARE_PROVIDER_SITE_OTHER): Payer: Medicare Other

## 2020-04-06 DIAGNOSIS — Z0000 Encounter for general adult medical examination without abnormal findings: Secondary | ICD-10-CM

## 2020-04-06 NOTE — Patient Instructions (Addendum)
Rebecca Anthony , Thank you for taking time to come for your Medicare Wellness Visit. I appreciate your ongoing commitment to your health goals. Please review the following plan we discussed and let me know if I can assist you in the future.   Screening recommendations/referrals: Colonoscopy: no longer required Mammogram: done 03/25/18. Please call [phone] to schedule your mammogram and bone density.  Bone Density: done 04/16/18 Recommended yearly ophthalmology/optometry visit for glaucoma screening and checkup Recommended yearly dental visit for hygiene and checkup  Vaccinations: Influenza vaccine: done 03/19/20 Pneumococcal vaccine: done 01/20/16 Tdap vaccine: done 05/24/10 Shingles vaccine: Shingrix discussed. Please contact your pharmacy for coverage information.    Covid-19: done 06/09/19, 06/30/19 & 01/30/20  Advanced directives: Please bring a copy of your health care power of attorney and living will to the office at your convenience.  Conditions/risks identified: Keep up the great work!  Next appointment: Follow up in one year for your annual wellness visit    Preventive Care 65 Years and Older, Female Preventive care refers to lifestyle choices and visits with your health care provider that can promote health and wellness. What does preventive care include?  A yearly physical exam. This is also called an annual well check.  Dental exams once or twice a year.  Routine eye exams. Ask your health care provider how often you should have your eyes checked.  Personal lifestyle choices, including:  Daily care of your teeth and gums.  Regular physical activity.  Eating a healthy diet.  Avoiding tobacco and drug use.  Limiting alcohol use.  Practicing safe sex.  Taking low-dose aspirin every day.  Taking vitamin and mineral supplements as recommended by your health care provider. What happens during an annual well check? The services and screenings done by your health  care provider during your annual well check will depend on your age, overall health, lifestyle risk factors, and family history of disease. Counseling  Your health care provider may ask you questions about your:  Alcohol use.  Tobacco use.  Drug use.  Emotional well-being.  Home and relationship well-being.  Sexual activity.  Eating habits.  History of falls.  Memory and ability to understand (cognition).  Work and work Statistician.  Reproductive health. Screening  You may have the following tests or measurements:  Height, weight, and BMI.  Blood pressure.  Lipid and cholesterol levels. These may be checked every 5 years, or more frequently if you are over 9 years old.  Skin check.  Lung cancer screening. You may have this screening every year starting at age 69 if you have a 30-pack-year history of smoking and currently smoke or have quit within the past 15 years.  Fecal occult blood test (FOBT) of the stool. You may have this test every year starting at age 66.  Flexible sigmoidoscopy or colonoscopy. You may have a sigmoidoscopy every 5 years or a colonoscopy every 10 years starting at age 47.  Hepatitis C blood test.  Hepatitis B blood test.  Sexually transmitted disease (STD) testing.  Diabetes screening. This is done by checking your blood sugar (glucose) after you have not eaten for a while (fasting). You may have this done every 1-3 years.  Bone density scan. This is done to screen for osteoporosis. You may have this done starting at age 71.  Mammogram. This may be done every 1-2 years. Talk to your health care provider about how often you should have regular mammograms. Talk with your health care provider about your test  results, treatment options, and if necessary, the need for more tests. Vaccines  Your health care provider may recommend certain vaccines, such as:  Influenza vaccine. This is recommended every year.  Tetanus, diphtheria, and  acellular pertussis (Tdap, Td) vaccine. You may need a Td booster every 10 years.  Zoster vaccine. You may need this after age 61.  Pneumococcal 13-valent conjugate (PCV13) vaccine. One dose is recommended after age 48.  Pneumococcal polysaccharide (PPSV23) vaccine. One dose is recommended after age 48. Talk to your health care provider about which screenings and vaccines you need and how often you need them. This information is not intended to replace advice given to you by your health care provider. Make sure you discuss any questions you have with your health care provider. Document Released: 04/30/2015 Document Revised: 12/22/2015 Document Reviewed: 02/02/2015 Elsevier Interactive Patient Education  2017 [employer] Prevention in the Home Falls can cause injuries. They can happen to people of all ages. There are many things you can do to make your home safe and to help prevent falls. What can I do on the outside of my home?  Regularly fix the edges of walkways and driveways and fix any cracks.  Remove anything that might make you trip as you walk through a door, such as a raised step or threshold.  Trim any bushes or trees on the path to your home.  Use bright outdoor lighting.  Clear any walking paths of anything that might make someone trip, such as rocks or tools.  Regularly check to see if handrails are loose or broken. Make sure that both sides of any steps have handrails.  Any raised decks and porches should have guardrails on the edges.  Have any leaves, snow, or ice cleared regularly.  Use sand or salt on walking paths during winter.  Clean up any spills in your garage right away. This includes oil or grease spills. What can I do in the bathroom?  Use night lights.  Install grab bars by the toilet and in the tub and shower. Do not use towel bars as grab bars.  Use non-skid mats or decals in the tub or shower.  If you need to sit down in the shower, use  a plastic, non-slip stool.  Keep the floor dry. Clean up any water that spills on the floor as soon as it happens.  Remove soap buildup in the tub or shower regularly.  Attach bath mats securely with double-sided non-slip rug tape.  Do not have throw rugs and other things on the floor that can make you trip. What can I do in the bedroom?  Use night lights.  Make sure that you have a light by your bed that is easy to reach.  Do not use any sheets or blankets that are too big for your bed. They should not hang down onto the floor.  Have a firm chair that has side arms. You can use this for support while you get dressed.  Do not have throw rugs and other things on the floor that can make you trip. What can I do in the kitchen?  Clean up any spills right away.  Avoid walking on wet floors.  Keep items that you use a lot in easy-to-reach places.  If you need to reach something above you, use a strong step stool that has a grab bar.  Keep electrical cords out of the way.  Do not use floor polish or wax that makes  floors slippery. If you must use wax, use non-skid floor wax.  Do not have throw rugs and other things on the floor that can make you trip. What can I do with my stairs?  Do not leave any items on the stairs.  Make sure that there are handrails on both sides of the stairs and use them. Fix handrails that are broken or loose. Make sure that handrails are as long as the stairways.  Check any carpeting to make sure that it is firmly attached to the stairs. Fix any carpet that is loose or worn.  Avoid having throw rugs at the top or bottom of the stairs. If you do have throw rugs, attach them to the floor with carpet tape.  Make sure that you have a light switch at the top of the stairs and the bottom of the stairs. If you do not have them, ask someone to add them for you. What else can I do to help prevent falls?  Wear shoes that:  Do not have high heels.  Have  rubber bottoms.  Are comfortable and fit you well.  Are closed at the toe. Do not wear sandals.  If you use a stepladder:  Make sure that it is fully opened. Do not climb a closed stepladder.  Make sure that both sides of the stepladder are locked into place.  Ask someone to hold it for you, if possible.  Clearly mark and make sure that you can see:  Any grab bars or handrails.  First and last steps.  Where the edge of each step is.  Use tools that help you move around (mobility aids) if they are needed. These include:  Canes.  Walkers.  Scooters.  Crutches.  Turn on the lights when you go into a dark area. Replace any light bulbs as soon as they burn out.  Set up your furniture so you have a clear path. Avoid moving your furniture around.  If any of your floors are uneven, fix them.  If there are any pets around you, be aware of where they are.  Review your medicines with your doctor. Some medicines can make you feel dizzy. This can increase your chance of falling. Ask your doctor what other things that you can do to help prevent falls. This information is not intended to replace advice given to you by your health care provider. Make sure you discuss any questions you have with your health care provider. Document Released: 01/28/2009 Document Revised: 09/09/2015 Document Reviewed: 05/08/2014 Elsevier Interactive Patient Education  2017 [employer].

## 2020-04-06 NOTE — Progress Notes (Signed)
Subjective:   Rebecca Anthony is a 74 y.o. female who presents for Medicare Annual (Subsequent) preventive examination.  Virtual Visit via Telephone Note  I connected with  Rebecca Anthony on 04/06/20 at  2:50 PM EST by telephone and verified that I am speaking with the correct person using two identifiers.  Location: Patient: home Provider: [place] Persons participating in the virtual visit: [place]   I discussed the limitations, risks, security and privacy concerns of performing an evaluation and management service by telephone and the availability of in person appointments. The patient expressed understanding and agreed to proceed.  Interactive audio and video telecommunications were attempted between this nurse and patient, however failed, due to patient having technical difficulties OR patient did not have access to video capability.  We continued and completed visit with audio only.  Some vital signs may be absent or patient reported.   Rebecca Marker, LPN   Review of Systems     Cardiac Risk Factors include: advanced age (>33men, >66 women);dyslipidemia;obesity (BMI >30kg/m2);hypertension     Objective:    There were no vitals filed for this visit. There is no height or weight on file to calculate BMI.  Advanced Directives 04/06/2020 02/13/2019 01/03/2018 02/14/2017 01/29/2017 09/06/2016 03/23/2016  Does Patient Have a Medical Advance Directive? Yes Yes Yes No No No No  Type of Paramedic of [place];Living will [place];Living will [place];Living will - - - -  Copy of [place] in Chart? No - copy requested No - copy requested No - copy requested - - - -  Would patient like information on creating a medical advance directive? - - - - - - -    Current Medications (verified) Outpatient Encounter Medications as of 04/06/2020  Medication Sig  . aspirin 81 MG tablet Take 1  tablet (81 mg total) by mouth daily. Do NOT take with Xarelto (just one or the other)  . calcium carbonate (OSCAL) 1500 (600 Ca) MG TABS tablet Take by mouth.   Marland Kitchen HORSE CHESTNUT PO Take 1 tablet by mouth daily.  Marland Kitchen HYDROcodone-acetaminophen (NORCO/VICODIN) 5-325 MG tablet Take 1 tablet by mouth every 4 (four) hours.  Marland Kitchen levothyroxine (SYNTHROID) 50 MCG tablet Take 1 tablet (50 mcg total) by mouth daily.  . metoprolol succinate (TOPROL-XL) 50 MG 24 hr tablet TAKE 1 TABLET (50 MG TOTAL) BY MOUTH DAILY. TAKE WITH OR IMMEDIATELY FOLLOWING A MEAL.  . mupirocin ointment (BACTROBAN) 2 % Apply 1 application topically 2 (two) times daily as needed. Apply to affected skin  . polyethylene glycol powder (GLYCOLAX/MIRALAX) powder Take 17 g by mouth daily. Dissolve 1 capful in 8 oz of liquid daily PRN (Patient taking differently: Take 17 g by mouth daily as needed (constipation.).)  . vitamin C (ASCORBIC ACID) 500 MG tablet Take 500 mg by mouth daily.  Marland Kitchen atorvastatin (LIPITOR) 10 MG tablet Take 1 tablet (10 mg total) by mouth at bedtime. (Patient not taking: No sig reported)  . [DISCONTINUED] gabapentin (NEURONTIN) 100 MG capsule Take 100 mg by mouth 3 (three) times daily. (Patient not taking: Reported on 12/04/2019)   No facility-administered encounter medications on file as of 04/06/2020.    Allergies (verified) Anaprox [naproxen sodium]   History: Past Medical History:  Diagnosis Date  . Chronic abdominal pain (LLQ) (Primary Area of Pain) (Left) 10/31/2017  . Chronic ankle pain (Fourth Area of Pain) (Left) 11/07/2017  . Chronic low back pain (Secondary Area  of Pain) (Bilateral) (L>R) 10/31/2017  . Chronic upper back pain [facility] Area of Pain) (Bilateral) (L>R) 10/31/2017  . Diverticulitis   . Diverticulosis   . Dysuria   . Heart murmur   . Hematuria, unspecified   . History of shingles   . History of swelling of feet   . Hypercholesterolemia   . Hypertension   . Hypothyroidism   . Irritable bowel  syndrome   . Lichenification and lichen simplex chronicus   . Localized superficial swelling, mass, or lump   . Onychomycosis 03/23/2016  . Osteoarthrosis, unspecified whether generalized or localized, unspecified site   . Osteoporosis, unspecified   . PAF (paroxysmal atrial fibrillation) ([place])    Diagnosed years ago -- originally on Quinidine  . Sigmoid diverticulosis 11/28/2017  . Sinus problem   . Synovial cyst, unspecified   . 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 ABDOMINAL HYSTERECTOMY    . ANKLE SURGERY Left    Torned tendon  . APPENDECTOMY    . AV FISTULA REPAIR    . BUNIONECTOMY    . CATARACT EXTRACTION W/PHACO Right 01/02/2019   Procedure: CATARACT EXTRACTION PHACO AND INTRAOCULAR LENS PLACEMENT ([place]) RIGHT;  Surgeon: Birder Robson, MD;  Location: [facility] ORS;  Service: Ophthalmology;  Laterality: Right;  [place] 00:42 CDE 7.91 fluid pack lot # [id number]  . CATARACT EXTRACTION W/PHACO Left 02/07/2019   Procedure: CATARACT EXTRACTION PHACO AND INTRAOCULAR LENS PLACEMENT ([place]) LEFT;  Surgeon: Birder Robson, MD;  Location: [facility] ORS;  Service: Ophthalmology;  Laterality: Left;  [place] 00:36.3CDE 5.01Fluid Pack Lot # [id number]  . COLONOSCOPY    . FOOT SURGERY Left   . VAGINAL HYSTERECTOMY    . VESICOVAGINAL FISTULA CLOSURE W/ TAH     Family History  Problem Relation Age of Onset  . Diabetes Mother   . Hypertension Mother   . Heart failure Mother   . Heart disease Father   . Hypertension Father   . Thyroid disease Sister   . Healthy Sister   . Breast cancer Neg Hx    Social History   Socioeconomic History  . Marital status: Married    Spouse name: Rebecca Anthony  . Number of children: 1  . Years of education: Not on file  . Highest education level: Bachelor's degree (e.g., BA, AB, BS)  Occupational History  . Occupation: Retired  Tobacco Use  . Smoking status: Never Smoker  . Smokeless tobacco: Never Used  . Tobacco  comment: smoking cessation materials not required  Vaping Use  . Vaping Use: Never used  Substance and Sexual Activity  . Alcohol use: No  . Drug use: No  . Sexual activity: Yes    Birth control/protection: None, Surgical  Other Topics Concern  . Not on file  Social History Narrative   Recently moved from [facility] back to [facility]   Social Determinants of Health   Financial Resource Strain: Low Risk   . Difficulty of Paying Living Expenses: Not hard at all  Food Insecurity: No Food Insecurity  . Worried About Charity fundraiser in the Last Year: Never true  . Ran Out of Food in the Last Year: Never true  Transportation Needs: No Transportation Needs  . Lack of Transportation (Medical): No  . Lack of Transportation (Non-Medical): No  Physical Activity: Insufficiently Active  . Days of Exercise per Week: 2 days  . Minutes of Exercise per Session: 60 min  Stress: No Stress Concern Present  .  Feeling of Stress : Not at all  Social Connections: Socially Integrated  . Frequency of Communication with Friends and Family: More than three times a week  . Frequency of Social Gatherings with Friends and Family: More than three times a week  . Attends Religious Services: More than 4 times per year  . Active Member of Clubs or Organizations: Yes  . Attends Archivist Meetings: More than 4 times per year  . Marital Status: Married    Tobacco Counseling Counseling given: Not Answered Comment: smoking cessation materials not required   Clinical Intake:  Pre-visit preparation completed: Yes  Pain : No/denies pain     Nutritional Risks: None Diabetes: No  How often do you need to have someone help you when you read instructions, pamphlets, or other written materials from your doctor or pharmacy?: 1 - Never    Interpreter Needed?: No  Information entered by :: Rebecca Marker LPN   Activities of Daily Living In your present state of health, do you have any difficulty performing  the following activities: 04/06/2020 03/19/2020  Hearing? Y Y  Comment declines hearing aids -  Vision? N N  Difficulty concentrating or making decisions? N N  Walking or climbing stairs? Y Y  Dressing or bathing? N N  Doing errands, shopping? Tempie Donning  Preparing Food and eating ? N -  Using the Toilet? N -  In the past six months, have you accidently leaked urine? N -  Do you have problems with loss of bowel control? N -  Managing your Medications? N -  Managing your Finances? N -  Housekeeping or managing your Housekeeping? N -  Some recent data might be hidden    Patient Care Team: Delsa Grana, PA-C as PCP - General (Family Medicine) Birder Robson, MD as Consulting Physician (Ophthalmology) Renato Shin, MD as Consulting Physician (Endocrinology) Minna Merritts, MD as Consulting Physician (Cardiology) Carloyn Manner, MD as Consulting Physician (Otolaryngology) Jonathon Bellows, MD as Consulting Physician (Gastroenterology) Meade Maw, MD as Consulting Physician (Neurosurgery) Milinda Pointer, MD as Referring Physician (Pain Medicine) Neldon Labella, RN as Registered Nurse  Indicate any recent Medical Services you may have received from other than [facility] providers in the past year (date may be approximate).     Assessment:   This is a routine wellness examination for Rebecca Anthony.  Hearing/Vision screen  Hearing Screening   125Hz  250Hz  500Hz  1000Hz  2000Hz  3000Hz  4000Hz  6000Hz  8000Hz   Right ear:           Left ear:           Comments: Pt states mild hearing difficulty; declines hearing aids at this time.   Vision Screening Comments: Annual vision screenings at [facility] Dr. Michelene Heady    Dietary issues and exercise activities discussed: Current Exercise Habits: Home exercise routine, Type of exercise: stretching;strength training/weights, Time (Minutes): 60, Frequency (Times/Week): 2, Weekly Exercise (Minutes/Week): 120, Intensity: Mild, Exercise limited  by: orthopedic condition(s)  Goals    . Chronic Disease Management     Current Barriers:  . Chronic Disease Management support and education needs related to Hypertension, Chronic Atrial Fibrillation and Dyslipidemia. (Hx of Chronic Pain Syndrome)  Nurse Case Manager Clinical Goal(s):  Over the next 120 days, patient: . Will not require hospitalization or emergency care d/t complications r/t chronic illnesses. . Will monitor BP and record readings. . Will continue compliance with recommended DASH diet. . Will take all medications as prescribed. . Will attend medical appointments as scheduled. Marland Kitchen  Will follow recommended safety measures to prevent falls and injuries. Over the next 60 days, patient: . Will complete Bone Density Scan . Will schedule and complete mammogram. . Will schedule and complete routine Cardiology outreach.  Interventions:  . Inter-disciplinary care team collaboration (see longitudinal plan of care) . Reviewed medications and indications for use. Advised to take all medications as prescribed and notify provider if unable to tolerate regimen. Encouraged to contact the care management team with concerns regarding medication management or prescription costs.  . Discussed recommendations regarding Hypertension self-management. Encouraged to continue recommended DASH diet. Discussed established BP parameters and indications for notifying a provider. Encouraged to monitor BP a few times a week if unable to monitor daily and record readings.   . Provided information regarding safety and fall prevention measures. Encouraged to keep assistive device readily available to use when needed d/t chronic back discomfort and unstable gait. Encouraged to keep pathways clear and well lit. Reports completing Physical Therapy once a week. She has a cane available to use as needed. Denies recent falls.   . Reviewed pending/scheduled appointments. Encouraged to attend medical appointments as  scheduled to prevent delays in care. Encouraged to contact the care management team with concerns regarding transportation.  . Discussed plans for ongoing care management follow up. Provided direct contact information.   Patient Self Care Activities:  . Self administers medications. . Calls pharmacy for medication refills. . Performs ADL's independently . Performs IADL's independently . Contacts provider office for new concerns or questions   Initial goal documentation     . DIET - REDUCE SODIUM INTAKE     Recommend to begin DASH diet (Diet attached to AVS).    . Increase physical activity     Recommend increasing physical activity to 150 minutes per week      Depression Screen PHQ 2/9 Scores 04/06/2020 03/19/2020 12/04/2019 05/20/2019 04/29/2019 02/13/2019 01/24/2019  PHQ - 2 Score 0 0 0 2 0 0 0  PHQ- 9 Score - - 0 4 0 - 0    Fall Risk Fall Risk  04/06/2020 03/19/2020 12/04/2019 05/20/2019 04/29/2019  Falls in the past year? 0 0 0 0 0  Number falls in past yr: 0 0 0 0 0  Injury with Fall? 0 0 0 0 0  Risk for fall due to : No Fall Risks - - - -  Risk for fall due to: Comment - - - - -  Follow up Falls prevention discussed Falls evaluation completed Falls evaluation completed - -    FALL RISK PREVENTION PERTAINING TO THE HOME:  Any stairs in or around the home? Yes  If so, are there any without handrails? No  Home free of loose throw rugs in walkways, pet beds, electrical cords, etc? Yes  Adequate lighting in your home to reduce risk of falls? Yes   ASSISTIVE DEVICES UTILIZED TO PREVENT FALLS:  Life alert? No  Use of a cane, walker or w/c? No  Grab bars in the bathroom? Yes  Shower chair or bench in shower? Yes  Elevated toilet seat or a handicapped toilet? Yes   TIMED UP AND GO:  Was the test performed? No . Telephonic visit  Cognitive Function: Normal cognitive status assessed by direct observation by this Nurse Health Advisor. No abnormalities found.       6CIT  Screen 02/13/2019 01/03/2018  What Year? 0 points 0 points  What month? 0 points 0 points  What time? 0 points 0 points  Count back  from 20 0 points 0 points  Months in reverse 0 points 0 points  Repeat phrase 0 points 0 points  Total Score 0 0    Immunizations Immunization History  Administered Date(s) Administered  . Fluad Quad(high Dose 65+) 01/24/2019, 03/19/2020  . Influenza, High Dose Seasonal PF 01/20/2016, 02/14/2017, 12/31/2017  . PFIZER SARS-COV-2 Vaccination 06/09/2019, 06/30/2019, 01/30/2020  . Pneumococcal Conjugate-13 01/20/2016  . Pneumococcal Polysaccharide-23 02/21/2010  . Tdap 05/24/2010  . Zoster 11/25/2015    TDAP status: Up to date  Flu Vaccine status: Up to date  Pneumococcal vaccine status: Up to date  Covid-19 vaccine status: Completed vaccines  Qualifies for Shingles Vaccine? Yes   Zostavax completed Yes   Shingrix Completed?: No.    Education has been provided regarding the importance of this vaccine. Patient has been advised to call insurance company to determine out of pocket expense if they have not yet received this vaccine. Advised may also receive vaccine at local pharmacy or Health Dept. Verbalized acceptance and understanding.  Screening Tests Health Maintenance  Topic Date Due  . MAMMOGRAM  03/26/2019  . TETANUS/TDAP  05/24/2020  . INFLUENZA VACCINE  Completed  . DEXA SCAN  Completed  . COVID-19 Vaccine  Completed  . Hepatitis C Screening  Completed  . PNA vac Low Risk Adult  Completed    Health Maintenance  Health Maintenance Due  Topic Date Due  . MAMMOGRAM  03/26/2019     Colorectal cancer screening: No longer required.   Mammogram status: Completed 03/25/18. Repeat every year  Bone Density status: Completed 04/16/18. Results reflect: Bone density results: OSTEOPENIA. Repeat every 2 years.  Lung Cancer Screening: (Low Dose CT Chest recommended if Age 54-80 years, 30 pack-year currently smoking OR have quit w/in 15years.)  does not qualify.   Additional Screening:  Hepatitis C Screening: does qualify; Completed 05/17/17  Vision Screening: Recommended annual ophthalmology exams for early detection of glaucoma and other disorders of the eye. Is the patient up to date with their annual eye exam?  Yes  Who is the provider or what is the name of the office in which the patient attends annual eye exams? [place] Screening: Recommended annual dental exams for proper oral hygiene  Community Resource Referral / Chronic Care Management: CRR required this visit?  No   CCM required this visit?  No      Plan:     I have personally reviewed and noted the following in the patient's chart:   . Medical and social history . Use of alcohol, tobacco or illicit drugs  . Current medications and supplements . Functional ability and status . Nutritional status . Physical activity . Advanced directives . List of other physicians . Hospitalizations, surgeries, and ER visits in previous 12 months . Vitals . Screenings to include cognitive, depression, and falls . Referrals and appointments  In addition, I have reviewed and discussed with patient certain preventive protocols, quality metrics, and best practice recommendations. A written personalized care plan for preventive services as well as general preventive health recommendations were provided to patient.     Rebecca Marker, LPN   [id number]   Nurse Notes: none

## 2020-04-23 ENCOUNTER — Telehealth: Payer: Self-pay

## 2020-04-28 ENCOUNTER — Ambulatory Visit: Payer: Medicare Other | Admitting: Podiatry

## 2020-05-10 ENCOUNTER — Other Ambulatory Visit: Payer: Self-pay | Admitting: Family Medicine

## 2020-05-10 NOTE — Telephone Encounter (Signed)
Copied from [facility] [id number]. Topic: Quick Communication - Rx Refill/Question >> May 10, 2020  1:35 PM Tessa Lerner A wrote: Medication: HYDROcodone-acetaminophen (NORCO/VICODIN) 5-325 MG   Has the patient contacted their pharmacy? Yes - pharmacy encouraged patient to contact PCP  Preferred Pharmacy (with phone number or street name): [id number] - [place], [place] Phone: [phone]  Agent: Please be advised that RX refills may take up to 3 business days. We ask that you follow-up with your pharmacy.

## 2020-05-10 NOTE — Telephone Encounter (Signed)
   Notes to clinic:  medication filled by a historical provider Review for refill    Requested Prescriptions  Pending Prescriptions Disp Refills   HYDROcodone-acetaminophen (NORCO/VICODIN) 5-325 MG tablet 30 tablet     Sig: Take 1 tablet by mouth every 4 (four) hours.      Not Delegated - Analgesics:  Opioid Agonist Combinations Failed - 05/10/2020  2:09 PM      Failed - This refill cannot be delegated      Failed - Urine Drug Screen completed in last 360 days      Passed - Valid encounter within last 6 months    Recent Outpatient Visits           1 month ago PAF (paroxysmal atrial fibrillation) [facility])   [facility], Kristeen Miss, PA-C   5 months ago Chronic atrial fibrillation [facility])   [facility] Delsa Grana, PA-C   11 months ago Left low back pain, unspecified chronicity, unspecified whether sciatica present   [facility] Delsa Grana, PA-C   1 year ago Left hip pain   [facility] Delsa Grana, PA-C   1 year ago Essential hypertension   [facility] Delsa Grana, PA-C       Future Appointments             In 2 months Delsa Grana, PA-C [facility], [place]   In 11 months  [facility], [facility]

## 2020-05-11 ENCOUNTER — Encounter: Payer: Self-pay | Admitting: Family Medicine

## 2020-05-17 ENCOUNTER — Ambulatory Visit: Payer: Medicare Other | Admitting: Podiatry

## 2020-05-19 ENCOUNTER — Other Ambulatory Visit: Payer: Self-pay | Admitting: Family Medicine

## 2020-05-19 DIAGNOSIS — I480 Paroxysmal atrial fibrillation: Secondary | ICD-10-CM

## 2020-05-19 DIAGNOSIS — I10 Essential (primary) hypertension: Secondary | ICD-10-CM

## 2020-06-02 ENCOUNTER — Ambulatory Visit: Payer: Medicare Other | Admitting: Podiatry

## 2020-06-16 ENCOUNTER — Ambulatory Visit: Payer: Medicare Other | Admitting: Podiatry

## 2020-06-24 ENCOUNTER — Ambulatory Visit: Payer: Medicare Other | Admitting: Podiatry

## 2020-06-28 ENCOUNTER — Ambulatory Visit: Payer: Medicare Other | Admitting: Podiatry

## 2020-06-28 ENCOUNTER — Other Ambulatory Visit: Payer: Self-pay

## 2020-06-28 ENCOUNTER — Encounter: Payer: Self-pay | Admitting: Podiatry

## 2020-06-28 DIAGNOSIS — B351 Tinea unguium: Secondary | ICD-10-CM | POA: Diagnosis not present

## 2020-06-28 DIAGNOSIS — Q828 Other specified congenital malformations of skin: Secondary | ICD-10-CM | POA: Diagnosis not present

## 2020-06-28 DIAGNOSIS — M79676 Pain in unspecified toe(s): Secondary | ICD-10-CM

## 2020-06-28 NOTE — Progress Notes (Signed)
She presents today chief complaint of painfully elongated toenails and calluses.  Objective: Pulses remain palpable no open lesions or wounds.  Toenails are long thick yellow dystrophic-like mycotic painful palpation multiple reactive hyperkeratotic lesions bilateral.  Assessment: Pain in limb secondary to onychomycosis and benign skin lesions.  Plan: Debridement of benign skin lesions debridement of toenails 1 through 5 bilateral.

## 2020-07-06 ENCOUNTER — Other Ambulatory Visit: Payer: Self-pay

## 2020-07-06 ENCOUNTER — Ambulatory Visit
Admission: RE | Admit: 2020-07-06 | Discharge: 2020-07-06 | Disposition: A | Discharge status: Home / Self Care | Payer: Medicare Other | Source: Ambulatory Visit | Attending: Family Medicine | Admitting: Family Medicine

## 2020-07-06 DIAGNOSIS — Z780 Asymptomatic menopausal state: Secondary | ICD-10-CM | POA: Diagnosis not present

## 2020-07-06 DIAGNOSIS — Z1231 Encounter for screening mammogram for malignant neoplasm of breast: Secondary | ICD-10-CM | POA: Diagnosis present

## 2020-07-08 ENCOUNTER — Telehealth: Payer: Self-pay

## 2020-07-08 NOTE — Telephone Encounter (Signed)
  Chronic Care Management   Outreach Note  07/08/2020 Name: Rebecca Anthony MRN: [id number] DOB: 11-01-1945  Primary Care Provider: Delsa Grana, PA-C Reason for referral : Chronic Care Management   An unsuccessful telephone outreach was attempted today. [name] is currently enrolled in the chronic care management program.    Follow Up Plan:  A HIPAA compliant voice message was left today requesting a return call.    [name] Care Management [facility] [phone]

## 2020-07-10 ENCOUNTER — Encounter: Payer: Self-pay | Admitting: Family Medicine

## 2020-07-10 DIAGNOSIS — M816 Localized osteoporosis [Lequesne]: Secondary | ICD-10-CM

## 2020-07-16 MED ORDER — ALENDRONATE SODIUM 70 MG PO TABS: 70.0000 mg | ORAL_TABLET | ORAL | 1 refills | Status: DC

## 2020-07-19 ENCOUNTER — Ambulatory Visit: Payer: Self-pay

## 2020-07-19 DIAGNOSIS — E785 Hyperlipidemia, unspecified: Secondary | ICD-10-CM

## 2020-07-19 DIAGNOSIS — I10 Essential (primary) hypertension: Secondary | ICD-10-CM

## 2020-07-19 NOTE — Chronic Care Management (AMB) (Signed)
  Chronic Care Management   Note  07/19/2020 Name: Rebecca Anthony MRN: [id number] DOB: 1945/10/11   Returned call to [name]. She reports receiving a call to cancel and reschedule a pending clinic outreach. Unable to recall the name of the staff member that contacted her. She called to confirm the new appointment date.  Per appointment/scheduling review, her pending Chronic Care Management/Nursing outreach on 07/28/20 was cancelled by a Care Guide.  The appointment was rescheduled for 08/04/20. She agreed to call if she requires assistance prior to the rescheduled appointment date.    Follow up plan: Will follow up later this month.   [name] Care Management [facility] [phone]

## 2020-07-22 ENCOUNTER — Ambulatory Visit: Payer: Medicare Other | Admitting: Family Medicine

## 2020-07-28 ENCOUNTER — Telehealth: Payer: Self-pay

## 2020-08-04 ENCOUNTER — Telehealth: Payer: Self-pay

## 2020-08-24 ENCOUNTER — Ambulatory Visit: Payer: Medicare Other | Admitting: Family Medicine

## 2020-08-26 ENCOUNTER — Telehealth: Payer: Self-pay

## 2020-08-26 NOTE — Telephone Encounter (Signed)
  Care Management      08/26/2020 Name: Rebecca Anthony MRN: [id number] DOB: May 01, 1945   Primary Care Provider: Delsa Grana, PA-C   Message received from [name] regarding pending care management outreach. Requested to reschedule telephonic outreach. She  Is pending a clinic visit on 09/02/20. Prefers to complete outreach after that date.    Follow Up Plan:  Telephonic outreach for today cancelled as requested. [name] will call to confirm availability and reschedule.    [name] Care Management [facility] [phone]

## 2020-09-02 ENCOUNTER — Ambulatory Visit: Payer: Self-pay | Admitting: Unknown Physician Specialty

## 2020-09-06 ENCOUNTER — Ambulatory Visit: Payer: Self-pay | Admitting: *Deleted

## 2020-09-06 NOTE — Telephone Encounter (Signed)
Pt reports fell last evening , down 2 porch steps. No LOC. Reports "Bump on side of my head size of golf ball, right arm bruised." Pt oriented x 3. Denies headache, no dizziness, no weakness. Reports head "Achy, not really painful." Did not apply ice when occurred. Per protocol, pt directed to UC. States will follow disposition, husband will drive.    Reason for Disposition . [1] Age over 14 years AND [2] swelling or bruise  Answer Assessment - Initial Assessment Questions 1. MECHANISM: "How did the injury happen?" For falls, ask: "What height did you fall from?" and "What surface did you fall against?"      Slipped and fell going down porch stairs, 2 steps 2. ONSET: "When did the injury happen?" (Minutes or hours ago)      Last evening 3. NEUROLOGIC SYMPTOMS: "Was there any loss of consciousness?" "Are there any other neurological symptoms?"      No 4. MENTAL STATUS: "Does the person know who they are, who you are, and where they are?"      yes 5. LOCATION: "What part of the head was hit?"      Right side towards back 6. SCALP APPEARANCE: "What does the scalp look like? Is it bleeding now?" If Yes, ask: "Is it difficult to stop?"      No break in skin 7. SIZE: For cuts, bruises, or swelling, ask: "How large is it?" (e.g., inches or centimeters)     Bruise right arm, "Lump on head." Size of golf ball or egg. 8. PAIN: "Is there any pain?" If Yes, ask: "How bad is it?"  (e.g., Scale 1-10; or mild, moderate, severe)      Not painful, "Achy" 9. TETANUS: For any breaks in the skin, ask: "When was the last tetanus booster?"     *N/A 10. OTHER SYMPTOMS: "Do you have any other symptoms?" (e.g., neck pain, vomiting)       Tender to touch. Took  Protocols used: HEAD INJURY-A-AH

## 2020-09-08 ENCOUNTER — Telehealth: Payer: Self-pay | Admitting: Family Medicine

## 2020-09-08 NOTE — Telephone Encounter (Signed)
Patient calling stating she fell on Sunday 09/05/2020, she states that after the fall she went to an Urgent Care and had some imaging done. The radiologist advise patient to contact PCP to get an Orthopedic referral to check left arm for a possible fracture. Please advise.

## 2020-09-08 NOTE — Telephone Encounter (Signed)
Patient is calling back and said she does need a referral for her wrist.  Please advise and call patient to confirm.  Patient would like to go to [facility].  [phone]

## 2020-09-08 NOTE — Telephone Encounter (Signed)
Pt notified to go to [facility]

## 2020-09-08 NOTE — Telephone Encounter (Signed)
Patient called back to inform Faith that she does not need a call back for an orthopedic referral.  She stated she has a doctor to treat her further so that request can be cancelled.

## 2020-09-09 ENCOUNTER — Telehealth: Payer: Self-pay

## 2020-09-09 ENCOUNTER — Ambulatory Visit: Payer: Self-pay

## 2020-09-09 DIAGNOSIS — I10 Essential (primary) hypertension: Secondary | ICD-10-CM

## 2020-09-09 NOTE — Telephone Encounter (Signed)
Copied from [place] [phone]. Topic: General - Inquiry >> Sep 09, 2020  4:18 PM Valere Dross wrote: Reason for CRM: Patient called in wanting to speak with Roselyn Reef, concerning insurance questions, patient did not want to go into further details, please advise.

## 2020-09-09 NOTE — Chronic Care Management (AMB) (Signed)
  Chronic Care Management   Note  09/09/2020 Name: Rebecca Anthony MRN: [id number] DOB: 07-06-1945   Message received from Mrs. Scheurich regarding care management follow-up. Prefers to complete outreach after completing her primary care clinic visit.  Returned call to confirm availability and preferred time.    Follow up plan: A HIPAA  Compliant voice message was left today requesting a return call. Will message the Care Guide team to assist with scheduling.    [name] Care Management [facility] [phone]

## 2020-09-15 ENCOUNTER — Ambulatory Visit: Payer: Self-pay

## 2020-09-15 ENCOUNTER — Other Ambulatory Visit: Payer: Self-pay | Admitting: Family Medicine

## 2020-09-15 DIAGNOSIS — E785 Hyperlipidemia, unspecified: Secondary | ICD-10-CM

## 2020-09-15 DIAGNOSIS — I10 Essential (primary) hypertension: Secondary | ICD-10-CM

## 2020-09-15 NOTE — Chronic Care Management (AMB) (Signed)
  Chronic Care Management   Note  09/15/2020 Name: Rebecca Anthony MRN: [id number] DOB: 03-Aug-1945   Message received from [name] regarding care management follow-up. Requested to reschedule next outreach for 09/29/20. Agreed to call with urgent concerns if needed.    Follow up plan: Will follow-up on 09/29/20 as requested.    [name] Care Management [facility] [phone]

## 2020-09-28 ENCOUNTER — Other Ambulatory Visit: Payer: Self-pay

## 2020-09-28 ENCOUNTER — Ambulatory Visit: Payer: Medicare Other | Admitting: Family Medicine

## 2020-09-28 ENCOUNTER — Encounter: Payer: Self-pay | Admitting: Family Medicine

## 2020-09-28 VITALS — BP 116/74 | HR 85 | Temp 98.0°F | Resp 16 | Ht 73.0 in | Wt 217.6 lb

## 2020-09-28 DIAGNOSIS — I10 Essential (primary) hypertension: Secondary | ICD-10-CM

## 2020-09-28 DIAGNOSIS — E039 Hypothyroidism, unspecified: Secondary | ICD-10-CM

## 2020-09-28 DIAGNOSIS — E785 Hyperlipidemia, unspecified: Secondary | ICD-10-CM | POA: Diagnosis not present

## 2020-09-28 DIAGNOSIS — I480 Paroxysmal atrial fibrillation: Secondary | ICD-10-CM

## 2020-09-28 DIAGNOSIS — I700 Atherosclerosis of aorta: Secondary | ICD-10-CM | POA: Diagnosis not present

## 2020-09-28 DIAGNOSIS — S5012XA Contusion of left forearm, initial encounter: Secondary | ICD-10-CM

## 2020-09-28 DIAGNOSIS — W109XXA Fall (on) (from) unspecified stairs and steps, initial encounter: Secondary | ICD-10-CM

## 2020-09-28 DIAGNOSIS — S0990XA Unspecified injury of head, initial encounter: Secondary | ICD-10-CM

## 2020-09-28 MED ORDER — METOPROLOL SUCCINATE ER 50 MG PO TB24: 50.0000 mg | ORAL_TABLET | Freq: Every day | ORAL | 3 refills | Status: DC

## 2020-09-28 NOTE — Progress Notes (Signed)
Name: Rebecca Anthony   MRN: [id number]    DOB: 1946-02-11   Date:09/28/2020       Progress Note  Chief Complaint  Patient presents with   Follow-up   Hypothyroidism   Hyperlipidemia   Hypertension     Subjective:   Rebecca Anthony is a 75 y.o. female, presents to clinic for routine f/up    Fall 2 weeks ago May 29th - head injury and arm contusion  Left arm deformity but xray done at [facility] was neg Head injury pt denies LOC or AMS - pts husband took her to UC  Hypothyroid on levothyroxine   aFib - pt has not gone to f/up with cardiology as previously instructed and encouraged  HLD - previously reviewed ASCVD risk and statin benefits/MOA etc, she agreed to take but returns today still not taking statin and determined to get her numbers better with diet/lifestyle.  Doesn't want to do labs today - wants to work on it a little longer  Osteoporosis - on fosamax and calcium/D3 supplement         Current Outpatient Medications:    alendronate (FOSAMAX) 70 MG tablet, Take 1 tablet (70 mg total) by mouth every 7 (seven) days. Take with a full glass of water on an empty stomach., Disp: 12 tablet, Rfl: 1   aspirin 81 MG tablet, Take 1 tablet (81 mg total) by mouth daily. Do NOT take with Xarelto (just one or the other), Disp: 30 tablet, Rfl:    atorvastatin (LIPITOR) 10 MG tablet, Take 1 tablet (10 mg total) by mouth at bedtime., Disp: 90 tablet, Rfl: 3   calcium carbonate (OSCAL) 1500 (600 Ca) MG TABS tablet, Take by mouth. , Disp: , Rfl:    HORSE CHESTNUT PO, Take 1 tablet by mouth daily., Disp: , Rfl:    HYDROcodone-acetaminophen (NORCO/VICODIN) 5-325 MG tablet, Take 1 tablet by mouth every 4 (four) hours., Disp: , Rfl:    levothyroxine (SYNTHROID) 50 MCG tablet, TAKE 1 TABLET BY MOUTH EVERY DAY, Disp: 30 tablet, Rfl: 0   metoprolol succinate (TOPROL-XL) 50 MG 24 hr tablet, TAKE 1 TABLET (50 MG TOTAL) BY MOUTH DAILY. TAKE WITH OR IMMEDIATELY FOLLOWING A MEAL., Disp: 90 tablet,  Rfl: 1   mupirocin ointment (BACTROBAN) 2 %, Apply 1 application topically 2 (two) times daily as needed. Apply to affected skin, Disp: 22 g, Rfl: 1   polyethylene glycol powder (GLYCOLAX/MIRALAX) powder, Take 17 g by mouth daily. Dissolve 1 capful in 8 oz of liquid daily PRN (Patient taking differently: Take 17 g by mouth daily as needed (constipation.).), Disp: 3350 g, Rfl: 1   vitamin C (ASCORBIC ACID) 500 MG tablet, Take 500 mg by mouth daily., Disp: , Rfl:   Patient Active Problem List   Diagnosis Date Noted   Chronic atrial fibrillation ([place]) 12/04/2019   Aortic atherosclerosis ([place]) 12/04/2019   Chronic sciatica of left side 10/24/2019   Dyslipidemia 05/30/2018   Prediabetes 05/14/2018   Thyroid nodule 01/28/2018   Gallbladder polyp 01/28/2018   Cyst of pancreas 01/28/2018   Foraminal stenosis of thoracic region 01/28/2018   Idiopathic scoliosis of thoracolumbar spine 11/28/2017   Chronic pain syndrome 10/31/2017   Obesity (BMI 30.0-34.9) 09/13/2017   Hematuria, microscopic 07/16/2017   Hypothyroidism 02/23/2017   Osteopenia of left femoral neck 03/23/2016   PAF (paroxysmal atrial fibrillation) ([place])    Hypertension     Past Surgical History:  Procedure Laterality Date   ABDOMINAL HYSTERECTOMY  ANKLE SURGERY Left    Torned tendon   APPENDECTOMY     AV FISTULA REPAIR     BUNIONECTOMY     CATARACT EXTRACTION W/PHACO Right 01/02/2019   Procedure: CATARACT EXTRACTION PHACO AND INTRAOCULAR LENS PLACEMENT ([place]) RIGHT;  Surgeon: Birder Robson, MD;  Location: [facility] ORS;  Service: Ophthalmology;  Laterality: Right;  [place] 00:42 CDE 7.91 fluid pack lot # [id number]   CATARACT EXTRACTION W/PHACO Left 02/07/2019   Procedure: CATARACT EXTRACTION PHACO AND INTRAOCULAR LENS PLACEMENT ([place]) LEFT;  Surgeon: Birder Robson, MD;  Location: [facility] ORS;  Service: Ophthalmology;  Laterality: Left;  [place] 00:36.3CDE 5.01Fluid Pack Lot # [id number]   COLONOSCOPY     FOOT SURGERY Left    VAGINAL  HYSTERECTOMY     VESICOVAGINAL FISTULA CLOSURE W/ TAH      Family History  Problem Relation Age of Onset   Diabetes Mother    Hypertension Mother    Heart failure Mother    Heart disease Father    Hypertension Father    Thyroid disease Sister    Healthy Sister    Breast cancer Neg Hx     Social History   Tobacco Use   Smoking status: Never   Smokeless tobacco: Never   Tobacco comments:    smoking cessation materials not required  Vaping Use   Vaping Use: Never used  Substance Use Topics   Alcohol use: No   Drug use: No     Allergies  Allergen Reactions   Anaprox [Naproxen Sodium] Rash    Health Maintenance  Topic Date Due   Zoster Vaccines- Shingrix (1 of 2) Never done   COVID-19 Vaccine (4 - Booster for Pfizer series) 05/01/2020   TETANUS/TDAP  05/24/2020   INFLUENZA VACCINE  11/15/2020   MAMMOGRAM  07/06/2021   DEXA SCAN  Completed   Hepatitis C Screening  Completed   PNA vac Low Risk Adult  Completed   HPV VACCINES  Aged Out    Chart Review Today: I personally reviewed active problem list, medication list, allergies, family history, social history, health maintenance, notes from last encounter, lab results, imaging with the patient/caregiver today. Extensive chart review of PCP OV, labs, recommendations re statins going back to 2017  Also reviewed cardiology visits going back 5+ years and all cardiac testing/ECG   Review of Systems  Constitutional: Negative.  Negative for activity change, appetite change, fatigue and fever.  HENT: Negative.    Eyes: Negative.   Respiratory: Negative.    Cardiovascular: Negative.   Gastrointestinal: Negative.   Endocrine: Negative.   Genitourinary: Negative.   Musculoskeletal:  Positive for back pain and joint swelling. Negative for neck pain and neck stiffness.  Skin: Negative.   Allergic/Immunologic: Negative.   Neurological:  Negative for dizziness, tremors, seizures, syncope, facial asymmetry, speech difficulty,  weakness, light-headedness, numbness and headaches.  Hematological: Negative.   Psychiatric/Behavioral: Negative.  Negative for behavioral problems, confusion, decreased concentration and sleep disturbance. The patient is not nervous/anxious.   All other systems reviewed and are negative.   Objective:   Vitals:   09/28/20 1603  BP: 116/74  Pulse: 85  Resp: 16  Temp: 98 F (36.7 C)  SpO2: 97%  Weight: 217 lb 9.6 oz (98.7 kg)  Height: 6\' 1"  (1.854 m)    Body mass index is 28.71 kg/m.  Physical Exam Vitals and nursing note reviewed.  Constitutional:      General: She is not in acute distress.    Appearance: She is well-developed  and well-groomed. She is obese. She is not ill-appearing, toxic-appearing or diaphoretic.     Interventions: Face mask in place.  HENT:     Head: Normocephalic. Contusion present. No raccoon eyes, Battle's sign, right periorbital erythema, left periorbital erythema or laceration.      Comments: Right parietal scalp swelling and firm abnormality in linear distribution (along red line drawn) raised, swollen, firm, no laceration or wound, with dependent diffuse bruising to right side of scalp and temple area    Right Ear: External ear normal.     Left Ear: External ear normal.  Eyes:     General: Lids are normal. No scleral icterus.       Right eye: No discharge.        Left eye: No discharge.     Conjunctiva/sclera: Conjunctivae normal.  Neck:     Trachea: Trachea and phonation normal. No tracheal deviation.  Cardiovascular:     Rate and Rhythm: Normal rate. Rhythm irregularly irregular. FrequentExtrasystoles are present.    Pulses: Normal pulses.          Radial pulses are 2+ on the right side and 2+ on the left side.     Heart sounds: No murmur heard.   No friction rub.  Pulmonary:     Effort: Pulmonary effort is normal. No respiratory distress.     Breath sounds: Normal breath sounds. No stridor. No wheezing, rhonchi or rales.  Chest:      Chest wall: No tenderness.  Abdominal:     General: Bowel sounds are normal. There is no distension.     Palpations: Abdomen is soft.  Musculoskeletal:        General: Swelling, tenderness and deformity present.     Left forearm: Swelling, edema, deformity, tenderness and bony tenderness present.     Cervical back: Normal range of motion.     Right lower leg: No edema.     Left lower leg: No edema.  Skin:    General: Skin is warm and dry.     Coloration: Skin is not jaundiced or pale.  Neurological:     Mental Status: She is alert. Mental status is at baseline.     Cranial Nerves: Cranial nerves are intact.     Sensory: Sensation is intact.     Motor: Motor function is intact. No abnormal muscle tone.     Coordination: Coordination is intact.     Gait: Gait abnormal (antalgic gait).  Psychiatric:        Attention and Perception: Attention normal.        Mood and Affect: Mood and affect normal.        Speech: Speech normal.        Behavior: Behavior normal. Behavior is cooperative.        Cognition and Memory: Cognition and memory normal.        Assessment & Plan:     ICD-10-CM   1. Dyslipidemia  E78.5 Lipid panel    Ambulatory referral to Cardiology   hx of HLD, managed with lifestyle due to pt previously refusing statin, reviewed statin meds, MOA, benefit/SE, info given in handouts and AVS, start lipitor 10     2. Aortic atherosclerosis (HCC)  I70.0 Lipid panel    Ambulatory referral to Cardiology   per past CT scan - pt previously agreeable to trying lipitor, however she returns 6-7 months later and has not taken it    3. PAF (paroxysmal atrial fibrillation) (HCC)  I48.0 metoprolol  succinate (TOPROL-XL) 50 MG 24 hr tablet    Ambulatory referral to Cardiology   again urged pt to do cardiology follow up    4. Essential hypertension  I10 metoprolol succinate (TOPROL-XL) 50 MG 24 hr tablet    COMPLETE METABOLIC PANEL WITH GFR   BP at goal today     5. Hypothyroidism,  unspecified type  E03.9 TSH   due for labs, meds to be refills once resulted    6. Closed head injury, initial encounter  S09.90XA    denies LOC, AMS, confusion, N/V, HA's - would do CT if any sx develop - deformity is concerning, would have expected it to improve in a few weeks  Patient denies any headaches, confusion, dizziness -she adamantly denies any loss of consciousness, she went to a retail urgent care and had an x-ray done of her arm but did not have any imaging of her C-spine or head no red flags with her symptoms over the past 3 weeks she has very abnormal deformities, swelling that is very firm to her right scalp and to her forearm, patient is at her baseline, grossly normal neuro exam, I have advised with any concerning symptoms to go to the ER right away with any prolonged symptoms such as headaches dizziness patient should get outpatient CT I am somewhat concerned that she may have had a skull fracture however I doubt any bleed with lack of symptoms and normal neurological exam today Spent a significant mount of time discussing concerning signs and symptoms with patient and with her spouse    7. Contusion of left forearm, initial encounter  S50.12XA DG Forearm Left  Enlarged firm swollen area to mid forearm patient reports a negative x-ray done in urgent care although I cannot see these records, she has good range of motion of her wrist and her elbow the enlarged area does appear slightly deformed, scant healing bruising on the opposite side of her arm, nontender to palpation, nonmobile enlarged area may be hematoma?  Or periosteal reaction?  With minimal pain and good range of motion patient can opt to monitor for improvement and continue ice therapy  We can also get an x-ray to recheck or refer her to a sports medicine specialist for further evaluation     8. Fall (on) (from) unspecified stairs and steps, initial encounter  W10.9XXA    2+ weeks ago on front steps on brick, pt denies  LOC, bruising deformity and swelling to right parietal scalp and left forearm       xray ordered - pt should get if still swollen or deformed when she completes labs  REviewed chart - going back before 2017 multiple MD's have advised pt to be on statin  Afib needs EKG and/or cardiology f/up   If any sx related to head injury - CT head will be ordered  Patient encouraged to follow-up in the next 2 weeks if not improving  Encouraged to follow-up with cardiology  Patient is resistant to starting statin although she has been advised to do so several times I did explain to her that she should start the medication and follow-up in 3 to 4 months to recheck for tolerance and have labs rechecked to note lipid improvement and ensure effectiveness  More than 40 minutes spent with pt and husband during [place] today More than 10 min chart review as noted above 12+ min spent to document in chart today  Delsa Grana, PA-C 09/28/20 4:07 PM

## 2020-09-28 NOTE — Patient Instructions (Addendum)
The 10-year ASCVD risk score Mikey Bussing DC Brooke Bonito., et al., 2013) is: 16.5%   Values used to calculate the score:     Age: 75 years     Sex: Female     Is Non-Hispanic African American: Yes     Diabetic: No     Tobacco smoker: No     Systolic Blood Pressure: 782 mmHg     Is BP treated: Yes     HDL Cholesterol: 72 mg/dL     Total Cholesterol: 210 mg/dL  Recommendations on cholesterol and starting statins.  There is a benefit from LDL-C (bad cholesterol) lowering with statin therapy at virtually all levels of cardiovascular risk.  If statin therapy had no side effects and caused no financial burden, it might be reasonable to recommend it to virtually all at-risk individuals, similar to a healthy diet and exercise  It is this good of a medication!!  It reduces risk in almost everyone.   There are possible side effects with ALL medications and with statins there is a small subset of the population who may not metabolize it well, which causing muscle aches as a side effect (~5%).  We monitor for this, can test for this, and usually are careful to work with you to get a medication that gives you the benefits with minimal side effects.  Some people are sensitive to medications in general and we try to use the highest dose tolerated to give the most benefit.   [email or web address]  ACC/AHA ASCVD Risk Calculator [email or web address]  [facility] urgent cares - [place] or [place] location  [facility] in clinic - next to [facility] emergency

## 2020-09-29 ENCOUNTER — Telehealth: Payer: Self-pay

## 2020-10-01 ENCOUNTER — Ambulatory Visit (INDEPENDENT_AMBULATORY_CARE_PROVIDER_SITE_OTHER): Payer: Medicare Other

## 2020-10-01 DIAGNOSIS — I10 Essential (primary) hypertension: Secondary | ICD-10-CM

## 2020-10-01 DIAGNOSIS — E785 Hyperlipidemia, unspecified: Secondary | ICD-10-CM

## 2020-10-01 DIAGNOSIS — Z9181 History of falling: Secondary | ICD-10-CM

## 2020-10-01 NOTE — Chronic Care Management (AMB) (Signed)
Chronic Care Management   Follow Up Note   10/01/2020 Name: Rebecca Anthony MRN: [id number] DOB: 1946/01/10  Primary Care Provider: Delsa Grana, PA-C Reason for referral : Chronic Care Management   Rebecca Anthony is a 75 y.o. year old female who is a primary care patient of Delsa Grana, [place]. She is currently enrolled in the Chronic Care Management program. A routine telephonic outreach was conducted today.  Review of [name] status, including review of consultants reports, relevant labs and test results was conducted today. Collaboration with appropriate care team members was performed as part of the comprehensive evaluation and provision of chronic care management services.     SDOH (Social Determinants of Health) assessments performed: No    Outpatient Encounter Medications as of 10/01/2020  Medication Sig Note   alendronate (FOSAMAX) 70 MG tablet Take 1 tablet (70 mg total) by mouth every 7 (seven) days. Take with a full glass of water on an empty stomach.    aspirin 81 MG tablet Take 1 tablet (81 mg total) by mouth daily. Do NOT take with Xarelto (just one or the other)    atorvastatin (LIPITOR) 10 MG tablet Take 1 tablet (10 mg total) by mouth at bedtime.    calcium carbonate (OSCAL) 1500 (600 Ca) MG TABS tablet Take by mouth.     HORSE CHESTNUT PO Take 1 tablet by mouth daily.    HYDROcodone-acetaminophen (NORCO/VICODIN) 5-325 MG tablet Take 1 tablet by mouth every 4 (four) hours.    levothyroxine (SYNTHROID) 50 MCG tablet TAKE 1 TABLET BY MOUTH EVERY DAY    metoprolol succinate (TOPROL-XL) 50 MG 24 hr tablet Take 1 tablet (50 mg total) by mouth daily. Take with or immediately following a meal.    mupirocin ointment (BACTROBAN) 2 % Apply 1 application topically 2 (two) times daily as needed. Apply to affected skin    polyethylene glycol powder (GLYCOLAX/MIRALAX) powder Take 17 g by mouth daily. Dissolve 1 capful in 8 oz of liquid daily PRN (Patient taking differently: Take 17  g by mouth daily as needed (constipation.).)    vitamin C (ASCORBIC ACID) 500 MG tablet Take 500 mg by mouth daily. 03/24/2020: Reports taking 1000mg  daily.    No facility-administered encounter medications on file as of 10/01/2020.     Objective:  Patient Care Plan: Dyslipidemia     Problem Identified: Dyslpidemia Monitored      Long-Range Goal: Dyslipidemia Monitored   Start Date: 10/01/2020  Expected End Date: 11/28/2020  Priority: Medium  Note:   Current Barriers:  Chronic Disease Management support and educational needs r/t Dyslipidemia management. Most recent lipid panel:     Component Value Date/Time   CHOL 210 (H) 03/19/2020 1504   TRIG 75 03/19/2020 1504   HDL 72 03/19/2020 1504   CHOLHDL 2.9 03/19/2020 1504   VLDL 19 04/14/2016 1848   LDLCALC 121 (H) 03/19/2020 1504   Care Manager Clinical Goal(s):  Over the next 90 days,  Patient will work with Consulting civil engineer, providers, and care team towards execution of optimized self-health management plan. Patient will take all medications exactly as prescribed and will call provider for medication related questions.  Interventions: Collaboration with Delsa Grana, PA-C regarding development and update of comprehensive plan of care as evidenced by provider attestation and co-signature Inter-disciplinary care team collaboration (see longitudinal plan of care) Reviewed medications and labs. Reports not taking Lipitor as prescribed. She currently prefers to adjust her diet and activity. Her cholesterol is currently elevated. She  is aware of active lab orders. Plans to complete within the next few weeks. Thoroughly discussed nutrition habits and heart health food options. Encouraged to continue monitoring sodium intake. Strongly encouraged to read nutrition labels and avoid highly processed foods when possible. She is very motivated to improve her health. Reviewed s/sx of heart attack, stroke and worsening symptoms that require  immediate medical attention.   Patient Goals/Self-Care Activities: -Take medications as prescribed. -Complete labs -Adhere to a heart healthy/cardiac prudent diet -Continue to engage in low impact activity as tolerated -Complete follow-up with the Cardiology team as recommended -Contact provider or care management team with questions or new concerns if needed.    Follow Up Plan:  Will follow up in two months      Patient Care Plan: Fall Risk (Adult)     Problem Identified: Fall Risk      Long-Range Goal: Absence of Fall and Fall-Related Injury   Start Date: 10/01/2020  Expected End Date: 11/30/2020  Priority: Medium  Note:   Fall Risk  10/01/2020 09/28/2020 04/06/2020 03/19/2020 12/04/2019  Falls in the past year? 1 0 0 0 0  Number falls in past yr: 0 0 0 0 0  Injury with Fall? 1 0 0 0 0  Risk for fall due to : Other (Comment) - No Fall Risks - -  Risk for fall due to: Comment Reports recent fall - - - -  Follow up Falls prevention discussed - Falls prevention discussed Falls evaluation completed Falls evaluation completed    Current Barriers:  Risk for Falls  Clinical Goal(s):  Over the next 60 days, patient will not experience falls or require emergent care d/t fall related injuries.  Interventions:  Collaboration with Delsa Grana, PA-C regarding development and update of comprehensive plan of care as evidenced by provider attestation and co-signature Inter-disciplinary care team collaboration (see longitudinal plan of care) Reviewed medications and discussed potential side effects such as dizziness and lightheadedness. Fall assessment complete: Reports experiencing a recent fall while attempting to ambulate down the steps. Resulted in a contusion to her left arm and a hematoma to her forehead. She was evaluated in Urgent Care.  Reports doing well since the fall, but her forehead remains swollen. Reviewed provider recommendations and thoroughly discussed worsening s/sx  that require immediate medical attention. Reviewed safety and fall prevention measures. Advised to use handrails and avoid attempting to carry multiple items when ambulating on stairs and steps. Advised to change positions slowly and use an assistive device when needed. Encouraged to avoid attempting to lift weighted objects and request assistance when needed.  Discussed ability to perform self-care and tasks in the home. Continues to perform all ADL's, IADL's and in home tasks independently. Her spouse is in the home and readily available to assist if needed. Declines need for in-home assistance or safety devices.   Self-Care Deficits/Patient Goals:  -Utilize assistive device appropriately with all ambulation -Ensure pathways are clear and well lit -Change positions slowly and use caution when ambulating -Wear secure fitting, skid free footwear when ambulating -Use safety rails when ambulating on steps/stairs -Notify provider or care management team for questions and new concerns as needed   Follow Up Plan:  Will follow up within two months        PLAN A member of the care management team will follow up within the next two months.    [name] Care Management [facility] [phone]

## 2020-10-04 ENCOUNTER — Ambulatory Visit: Payer: Medicare Other | Admitting: Podiatry

## 2020-10-13 ENCOUNTER — Ambulatory Visit: Payer: Medicare Other | Admitting: Podiatry

## 2020-10-15 ENCOUNTER — Other Ambulatory Visit: Payer: Self-pay | Admitting: Family Medicine

## 2020-10-21 NOTE — Patient Instructions (Signed)
Thank you for allowing the Chronic Care Management team to participate in your care.  It was a pleasure speaking with you! Please feel free to contact me with questions.   Goals Addressed: Patient Care Plan: Dyslipidemia     Problem Identified: Dyslpidemia Monitored      Long-Range Goal: Dyslipidemia Monitored   Start Date: 10/01/2020  Expected End Date: 11/28/2020  Priority: Medium  Note:   Current Barriers:  Chronic Disease Management support and educational needs r/t Dyslipidemia management. Most recent lipid panel:     Component Value Date/Time   CHOL 210 (H) 03/19/2020 1504   TRIG 75 03/19/2020 1504   HDL 72 03/19/2020 1504   CHOLHDL 2.9 03/19/2020 1504   VLDL 19 04/14/2016 1848   LDLCALC 121 (H) 03/19/2020 1504   Care Manager Clinical Goal(s):  Over the next 90 days,  Patient will work with Consulting civil engineer, providers, and care team towards execution of optimized self-health management plan. Patient will take all medications exactly as prescribed and will call provider for medication related questions.  Interventions: Collaboration with Rebecca Grana, PA-C regarding development and update of comprehensive plan of care as evidenced by provider attestation and co-signature Inter-disciplinary care team collaboration (see longitudinal plan of care) Reviewed medications and labs. Reports not taking Lipitor as prescribed. She currently prefers to adjust her diet and activity. Her cholesterol is currently elevated. She is aware of active lab orders. Plans to complete within the next few weeks. Thoroughly discussed nutrition habits and heart health food options. Encouraged to continue monitoring sodium intake. Strongly encouraged to read nutrition labels and avoid highly processed foods when possible. She is very motivated to improve her health. Reviewed s/sx of heart attack, stroke and worsening symptoms that require immediate medical attention.   Patient Goals/Self-Care  Activities: -Take medications as prescribed. -Complete labs -Adhere to a heart healthy/cardiac prudent diet -Continue to engage in low impact activity as tolerated -Complete follow-up with the Cardiology team as recommended -Contact provider or care management team with questions or new concerns if needed.    Follow Up Plan:  Will follow up in two months      Patient Care Plan: Fall Risk (Adult)     Problem Identified: Fall Risk      Long-Range Goal: Absence of Fall and Fall-Related Injury   Start Date: 10/01/2020  Expected End Date: 11/30/2020  Priority: Medium  Note:   Fall Risk  10/01/2020 09/28/2020 04/06/2020 03/19/2020 12/04/2019  Falls in the past year? 1 0 0 0 0  Number falls in past yr: 0 0 0 0 0  Injury with Fall? 1 0 0 0 0  Risk for fall due to : Other (Comment) - No Fall Risks - -  Risk for fall due to: Comment Reports recent fall - - - -  Follow up Falls prevention discussed - Falls prevention discussed Falls evaluation completed Falls evaluation completed    Current Barriers:  Risk for Falls  Clinical Goal(s):  Over the next 60 days, patient will not experience falls or require emergent care d/t fall related injuries.  Interventions:  Collaboration with Rebecca Grana, PA-C regarding development and update of comprehensive plan of care as evidenced by provider attestation and co-signature Inter-disciplinary care team collaboration (see longitudinal plan of care) Reviewed medications and discussed potential side effects such as dizziness and lightheadedness. Fall assessment complete: Reports experiencing a recent fall while attempting to ambulate down the steps. Resulted in a contusion to her left arm and a hematoma to  her forehead. She was evaluated in Urgent Care.  Reports doing well since the fall, but her forehead remains swollen. Reviewed provider recommendations and thoroughly discussed worsening s/sx that require immediate medical attention. Reviewed safety  and fall prevention measures. Advised to use handrails and avoid attempting to carry multiple items when ambulating on stairs and steps. Advised to change positions slowly and use an assistive device when needed. Encouraged to avoid attempting to lift weighted objects and request assistance when needed.  Discussed ability to perform self-care and tasks in the home. Continues to perform all ADL's, IADL's and in home tasks independently. Her spouse is in the home and readily available to assist if needed. Declines need for in-home assistance or safety devices.   Self-Care Deficits/Patient Goals:  -Utilize assistive device appropriately with all ambulation -Ensure pathways are clear and well lit -Change positions slowly and use caution when ambulating -Wear secure fitting, skid free footwear when ambulating -Use safety rails when ambulating on steps/stairs -Notify provider or care management team for questions and new concerns as needed   Follow Up Plan:  Will follow up within two months         Rebecca Anthony verbalized understanding of the information discussed during the telephonic outreach today. Declined need for mailed/printed instructions. A member of the care management team will follow up within the next two months.    Rebecca Anthony [facility] Care Management [facility] [phone]

## 2020-10-23 ENCOUNTER — Other Ambulatory Visit: Payer: Self-pay | Admitting: Family Medicine

## 2020-10-29 ENCOUNTER — Telehealth: Payer: Self-pay

## 2020-10-29 ENCOUNTER — Other Ambulatory Visit: Payer: Self-pay | Admitting: Family Medicine

## 2020-10-29 LAB — COMPLETE METABOLIC PANEL WITH GFR
AG Ratio: 1.7 (calc) (ref 1.0–2.5)
ALT: 15 U/L (ref 6–29)
AST: 22 U/L (ref 10–35)
Albumin: 4.3 g/dL (ref 3.6–5.1)
Alkaline phosphatase (APISO): 72 U/L (ref 37–153)
BUN: 25 mg/dL (ref 7–25)
CO2: 25 mmol/L (ref 20–32)
Calcium: 9.5 mg/dL (ref 8.6–10.4)
Chloride: 106 mmol/L (ref 98–110)
Creat: 0.83 mg/dL (ref 0.60–1.00)
Globulin: 2.5 g/dL (calc) (ref 1.9–3.7)
Glucose, Bld: 97 mg/dL (ref 65–99)
Potassium: 4.3 mmol/L (ref 3.5–5.3)
Sodium: 139 mmol/L (ref 135–146)
Total Bilirubin: 0.5 mg/dL (ref 0.2–1.2)
Total Protein: 6.8 g/dL (ref 6.1–8.1)
eGFR: 73 mL/min/{1.73_m2} (ref >=60)

## 2020-10-29 LAB — LIPID PANEL
Cholesterol: 217 mg/dL — ABNORMAL HIGH (ref <200)
HDL: 71 mg/dL (ref >=50)
LDL Cholesterol (Calc): 130 mg/dL (calc) — ABNORMAL HIGH
Non-HDL Cholesterol (Calc): 146 mg/dL (calc) — ABNORMAL HIGH (ref <130)
Total CHOL/HDL Ratio: 3.1 (calc) (ref <5.0)
Triglycerides: 64 mg/dL (ref <150)

## 2020-10-29 LAB — TSH: TSH: 0.96 mIU/L (ref 0.40–4.50)

## 2020-10-29 NOTE — Telephone Encounter (Signed)
Copied from [place] [id number]. Topic: General - Other >> Oct 29, 2020 11:25 AM Celene Kras wrote: Reason for CRM: Pt calling in regarding her lab results. She is requesting to have a nurse to give her a call back to go over those with her. Please advise.

## 2020-10-29 NOTE — Telephone Encounter (Signed)
Provider must 1st review

## 2020-11-01 ENCOUNTER — Other Ambulatory Visit: Payer: Self-pay | Admitting: Family Medicine

## 2020-11-01 ENCOUNTER — Telehealth: Payer: Self-pay | Admitting: Family Medicine

## 2020-11-01 ENCOUNTER — Telehealth: Payer: Self-pay

## 2020-11-01 DIAGNOSIS — I700 Atherosclerosis of aorta: Secondary | ICD-10-CM

## 2020-11-01 DIAGNOSIS — E785 Hyperlipidemia, unspecified: Secondary | ICD-10-CM

## 2020-11-01 MED ORDER — ROSUVASTATIN CALCIUM 10 MG PO TABS: 10.0000 mg | ORAL_TABLET | Freq: Every day | ORAL | 3 refills | Status: DC

## 2020-11-01 MED ORDER — LEVOTHYROXINE SODIUM 50 MCG PO TABS: 50.0000 ug | ORAL_TABLET | Freq: Every day | ORAL | 3 refills | Status: DC

## 2020-11-01 NOTE — Telephone Encounter (Unsigned)
Copied from [place] [id number]. Topic: Quick Communication - Rx Refill/Question >> Nov 01, 2020 11:52 AM Tessa Lerner A wrote: Medication: levothyroxine (SYNTHROID) 50 MCG tablet - patient has 0 (zero) tablets remaining  Has the patient contacted their pharmacy? Yes.  levothyroxine (SYNTHROID) 50 MCG tablet  (Agent: If no, request that the patient contact the pharmacy for the refill.) (Agent: If yes, when and what did the pharmacy advise?)  Preferred Pharmacy (with phone number or street name): [id number] [place] [place]  Phone:  [phone] Fax:  [phone]   Agent: Please be advised that RX refills may take up to 3 business days. We ask that you follow-up with your pharmacy.

## 2020-11-01 NOTE — Telephone Encounter (Signed)
Copied from [place] [id number]. Topic: General - Other >> Oct 29, 2020  5:00 PM [place], [place] wrote:   Medication levothyroxine (SYNTHROID) 50 MCG tablet [4421]       Reason for CRM: patient asking for short supply of levothyroxine (SYNTHROID) 50 MCG tablet 30 tablet until refill comes in. Please call back

## 2020-11-15 ENCOUNTER — Ambulatory Visit: Payer: Medicare Other | Admitting: Podiatry

## 2020-11-17 ENCOUNTER — Encounter: Payer: Self-pay | Admitting: Podiatry

## 2020-11-17 ENCOUNTER — Ambulatory Visit: Payer: Medicare Other | Admitting: Podiatry

## 2020-11-17 ENCOUNTER — Other Ambulatory Visit: Payer: Self-pay

## 2020-11-17 DIAGNOSIS — D2371 Other benign neoplasm of skin of right lower limb, including hip: Secondary | ICD-10-CM

## 2020-11-17 DIAGNOSIS — D2372 Other benign neoplasm of skin of left lower limb, including hip: Secondary | ICD-10-CM

## 2020-11-17 DIAGNOSIS — M79676 Pain in unspecified toe(s): Secondary | ICD-10-CM

## 2020-11-17 DIAGNOSIS — B351 Tinea unguium: Secondary | ICD-10-CM | POA: Diagnosis not present

## 2020-11-17 NOTE — Progress Notes (Signed)
She presents today chief complaint of painful elongated toenails 1 through 5 bilaterally.  Multiple calluses plantar aspect of the left foot painful.  Objective: Vital signs are stable alert and oriented x3.  Toenails are long thick yellow dystrophic Lee mycotic overdue for debridement.  Reactive hyper keratoma subfirst metatarsal head of the left foot most painful though she does have a diffuse tyloma forefoot left.  Assessment: Pain limb secondary to onychomycosis and benign skin lesions.  Plan: Debridement of benign skin lesions debridement of toenails 1 through 5 bilateral

## 2020-11-19 ENCOUNTER — Encounter: Payer: Self-pay | Admitting: Podiatry

## 2020-12-10 ENCOUNTER — Telehealth: Payer: Self-pay | Admitting: Podiatry

## 2020-12-10 NOTE — Telephone Encounter (Signed)
Patient called stating she sent Dr. Milinda Pointer a message through [place] but patient hasn't received a response. Patient was curious if he received it. It was about soaking her feet in espsom salt.

## 2021-01-06 ENCOUNTER — Other Ambulatory Visit: Payer: Self-pay | Admitting: Family Medicine

## 2021-01-06 ENCOUNTER — Encounter: Payer: Self-pay | Admitting: Family Medicine

## 2021-01-06 DIAGNOSIS — M816 Localized osteoporosis [Lequesne]: Secondary | ICD-10-CM

## 2021-01-06 MED ORDER — ALENDRONATE SODIUM 70 MG PO TABS: 70.0000 mg | ORAL_TABLET | ORAL | 0 refills | Status: DC

## 2021-01-06 NOTE — Telephone Encounter (Signed)
Medication Refill - Medication: Fosamax  1 pill a week   Has the patient contacted their pharmacy? No. (Agent: If no, request that the patient contact the pharmacy for the refill.) (Agent: If yes, when and what did the pharmacy advise?)  Preferred Pharmacy (with phone number [facility] street [facility] [address] Has the patient been seen for an appointment in the last year OR does the patient have an upcoming appointment? Yes.    Agent: Please be advised that RX refills may take up to 3 business days. We ask that you follow-up with your pharmacy.

## 2021-02-17 ENCOUNTER — Telehealth: Payer: Self-pay

## 2021-02-17 NOTE — Telephone Encounter (Signed)
I have added flu shot to [facility].

## 2021-02-17 NOTE — Telephone Encounter (Signed)
Copied from [place] [phone]. Topic: General - Other >> Feb 16, 2021  4:56 PM Bayard Beaver wrote: Reason for PTE:LMRAJHH called in to state she has alrady [facility]

## 2021-02-23 ENCOUNTER — Ambulatory Visit: Payer: Medicare Other | Admitting: Podiatry

## 2021-02-23 ENCOUNTER — Encounter: Payer: Self-pay | Admitting: Podiatry

## 2021-02-23 ENCOUNTER — Other Ambulatory Visit: Payer: Self-pay

## 2021-02-23 DIAGNOSIS — D2372 Other benign neoplasm of skin of left lower limb, including hip: Secondary | ICD-10-CM | POA: Diagnosis not present

## 2021-02-23 DIAGNOSIS — M79676 Pain in unspecified toe(s): Secondary | ICD-10-CM | POA: Diagnosis not present

## 2021-02-23 DIAGNOSIS — D2371 Other benign neoplasm of skin of right lower limb, including hip: Secondary | ICD-10-CM

## 2021-02-23 DIAGNOSIS — B351 Tinea unguium: Secondary | ICD-10-CM | POA: Diagnosis not present

## 2021-02-23 NOTE — Progress Notes (Signed)
She presents today for routine nail and callus trim.  She is also stating that she like to see about getting a new set of orthotics.  Objective: Vital signs are stable alert and oriented x3 there is no erythema edema cellulitis drainage or odor she does have reactive hyper keratomas plantar aspect of the forefoot left and long dystrophic mycotic nails.  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  Plan: Debridement of benign skin lesions debridement of nails 1 through 5 bilaterally scheduled her with the ped orthotist for orthotics.

## 2021-02-28 ENCOUNTER — Other Ambulatory Visit: Payer: Self-pay | Admitting: Family Medicine

## 2021-03-01 NOTE — Telephone Encounter (Signed)
Rx refused by office -02/28/21 Requested Prescriptions  Pending Prescriptions Disp Refills  . mupirocin ointment (BACTROBAN) 2 % [Pharmacy Med Name: MUPIROCIN 2% OINTMENT] 22 g 1    Sig: APPLY 1 APPLICATION TOPICALLY 2 (TWO) TIMES DAILY AS NEEDED. APPLY TO AFFECTED SKIN     Off-Protocol Failed - 02/28/2021  5:38 PM      Failed - Medication not assigned to a protocol, review manually.      Passed - Valid encounter within last 12 months    Recent Outpatient Visits          5 months ago Dyslipidemia   [facility] Delsa Grana, [place]   11 months ago PAF (paroxysmal atrial fibrillation) [facility])   [facility] Delsa Grana, PA-C   1 year ago Chronic atrial fibrillation [facility])   [facility] Delsa Grana, PA-C   1 year ago Left low back pain, unspecified chronicity, unspecified whether sciatica present   [facility] Delsa Grana, PA-C   1 year ago Left hip pain   [facility] Delsa Grana, PA-C      Future Appointments            In 1 month [facility], [facility]

## 2021-03-15 ENCOUNTER — Telehealth: Payer: Self-pay | Admitting: *Deleted

## 2021-03-15 NOTE — Telephone Encounter (Signed)
She said that it's the bottom of her foot(calluses), which was worked on a few weeks ago but now uncomfortable.

## 2021-03-15 NOTE — Telephone Encounter (Signed)
Patient is calling because her foot is still sore,what should she use OTC to get some relief? Please advise.

## 2021-03-16 ENCOUNTER — Telehealth: Payer: Self-pay | Admitting: Podiatry

## 2021-03-16 NOTE — Telephone Encounter (Signed)
Please contact patient to schedule a f/u appointment for callouses

## 2021-03-16 NOTE — Telephone Encounter (Signed)
She ask if you can give her some medication for her foot its still very painful on the bottom

## 2021-03-17 NOTE — Telephone Encounter (Signed)
Returned the  call to patient giving recommendations per physician,verbalized understanding. 

## 2021-03-24 ENCOUNTER — Other Ambulatory Visit: Payer: Self-pay | Admitting: Family Medicine

## 2021-03-24 DIAGNOSIS — M816 Localized osteoporosis [Lequesne]: Secondary | ICD-10-CM

## 2021-03-24 NOTE — Telephone Encounter (Signed)
PTs  needs f/u appt for refills

## 2021-03-25 ENCOUNTER — Other Ambulatory Visit: Payer: Self-pay | Admitting: Family Medicine

## 2021-03-25 ENCOUNTER — Telehealth: Payer: Self-pay

## 2021-03-25 DIAGNOSIS — M816 Localized osteoporosis [Lequesne]: Secondary | ICD-10-CM

## 2021-03-25 NOTE — Telephone Encounter (Signed)
Copied from [place] [phone]. Topic: General - Other >> Mar 25, 2021  3:10 PM Tessa Lerner A wrote: Reason for CRM: The patient called to notify the practice that they have scheduled their medication follow up for 05/03/21 at 2:40  Please contact further if needed

## 2021-03-25 NOTE — Telephone Encounter (Signed)
Lvm for pt to call back and schedule an appt 

## 2021-03-30 ENCOUNTER — Other Ambulatory Visit: Payer: Medicare Other

## 2021-03-30 ENCOUNTER — Other Ambulatory Visit: Payer: Self-pay

## 2021-03-30 DIAGNOSIS — Q666 Other congenital valgus deformities of feet: Secondary | ICD-10-CM

## 2021-04-01 ENCOUNTER — Encounter: Payer: Self-pay | Admitting: Podiatry

## 2021-04-12 ENCOUNTER — Ambulatory Visit: Payer: Medicare Other

## 2021-04-15 ENCOUNTER — Other Ambulatory Visit: Payer: Medicare Other

## 2021-04-19 ENCOUNTER — Other Ambulatory Visit: Payer: Self-pay | Admitting: Family Medicine

## 2021-04-19 DIAGNOSIS — M816 Localized osteoporosis [Lequesne]: Secondary | ICD-10-CM

## 2021-04-19 NOTE — Telephone Encounter (Signed)
Recommend labs on next apt- vitamin D in particular

## 2021-04-26 ENCOUNTER — Ambulatory Visit: Payer: Medicare Other | Admitting: Cardiovascular Disease

## 2021-04-26 ENCOUNTER — Encounter: Payer: Self-pay | Admitting: Cardiovascular Disease

## 2021-04-26 ENCOUNTER — Other Ambulatory Visit: Payer: Self-pay

## 2021-04-26 VITALS — BP 178/102 | HR 85 | Ht 69.0 in | Wt 224.0 lb

## 2021-04-26 DIAGNOSIS — I4811 Longstanding persistent atrial fibrillation: Secondary | ICD-10-CM | POA: Diagnosis not present

## 2021-04-26 DIAGNOSIS — E785 Hyperlipidemia, unspecified: Secondary | ICD-10-CM

## 2021-04-26 DIAGNOSIS — R7303 Prediabetes: Secondary | ICD-10-CM

## 2021-04-26 DIAGNOSIS — I10 Essential (primary) hypertension: Secondary | ICD-10-CM | POA: Diagnosis not present

## 2021-04-26 DIAGNOSIS — I700 Atherosclerosis of aorta: Secondary | ICD-10-CM

## 2021-04-26 MED ORDER — APIXABAN 5 MG PO TABS: 5.0000 mg | ORAL_TABLET | Freq: Two times a day (BID) | ORAL | 3 refills | Status: DC

## 2021-04-26 NOTE — Progress Notes (Signed)
Cardiology Office Note  Date:  04/26/2021   ID:  Mertis, Mosher 1945/08/21, MRN [id number]  PCP:  Delsa Grana, PA-C   Chief Complaint  Patient presents with   New Patient (Initial Visit)    Ref by Delsa Grana, PA-C for A-Fib; seen in 2015 by Dr. Ellyn Hack. Medications reviewed by the patient verbally. Patient c/o racing heart beats at times and has occasional swelling in legs with more on the right.    HPI:  Ms. Rebecca Anthony is a 76 year old woman with past medical history of Hyperlipidemia Permanent atrial fibrillation  Who presents by referral from Delsa Grana for consultation of her aortic atherosclerosis, hyperlipidemia, atrial fibrillation  Presents with husband on today's visit Prior imaging studies reviewed CT abdomen pelvis January 2018  Echocardiogram 2015, normal ejection function  Lab work reviewed Total cholesterol 217 LDL 130  On discussion of her atrial fibrillation history, she reports prior history of atrial fibrillation, treated with quinidine when she was living in [place].  She stopped taking it for some reason  2015, episode of rapid A. fib following her colonoscopy at [facility].  started on metoprolol for rate control,  Several EKGs since that time 2015, 2017, 2019 documenting atrial fibrillation on my review In atrial fibrillation on today's visit, reports that occasionally she does feel palpitations but most the time is asymptomatic  Sedentary, denies falls, presents in a wheelchair  EKG personally reviewed by myself on todays visit Atrial fibrillation ventricular rate 85 bpm   PMH:   has a past medical history of Chronic abdominal pain (LLQ) (Primary Area of Pain) (Left) (10/31/2017), Chronic ankle pain (Fourth Area of Pain) (Left) (11/07/2017), Chronic low back pain (Secondary Area of Pain) (Bilateral) (L>R) (10/31/2017), Chronic upper back pain (Tertiary Area of Pain) (Bilateral) (L>R) (10/31/2017), Diverticulitis, Diverticulosis, Dysuria, Heart murmur,  Hematuria, unspecified, History of shingles, History of swelling of feet, Hypercholesterolemia, Hypertension, Hypothyroidism, Irritable bowel syndrome, Lichenification and lichen simplex chronicus, Localized superficial swelling, mass, or lump, Onychomycosis (03/23/2016), Osteoarthrosis, unspecified whether generalized or localized, unspecified site, Osteoporosis, unspecified, PAF (paroxysmal atrial fibrillation) ([place]), Sigmoid diverticulosis (11/28/2017), Sinus problem, Synovial cyst, unspecified, and 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  PSH:    Past Surgical History:  Procedure Laterality Date   ABDOMINAL HYSTERECTOMY     ANKLE SURGERY Left    Torned tendon   APPENDECTOMY     AV FISTULA REPAIR     BUNIONECTOMY     CATARACT EXTRACTION W/PHACO Right 01/02/2019   Procedure: CATARACT EXTRACTION PHACO AND INTRAOCULAR LENS PLACEMENT ([place]) RIGHT;  Surgeon: Birder Robson, MD;  Location: [facility] ORS;  Service: Ophthalmology;  Laterality: Right;  [place] 00:42 CDE 7.91 fluid pack lot # [id number]   CATARACT EXTRACTION W/PHACO Left 02/07/2019   Procedure: CATARACT EXTRACTION PHACO AND INTRAOCULAR LENS PLACEMENT ([place]) LEFT;  Surgeon: Birder Robson, MD;  Location: [facility] ORS;  Service: Ophthalmology;  Laterality: Left;  [place] 00:36.3CDE 5.01Fluid Pack Lot # [id number]   COLONOSCOPY     FOOT SURGERY Left    VAGINAL HYSTERECTOMY     VESICOVAGINAL FISTULA CLOSURE W/ TAH      Current Outpatient Medications  Medication Sig Dispense Refill   alendronate (FOSAMAX) 70 MG tablet TAKE 1 TABLET (70 MG TOTAL) BY MOUTH EVERY 7 DAYS WITH FULL GLASS WATER ON EMPTY STOMACH 4 tablet 0   aspirin 81 MG tablet Take 1 tablet (81 mg total) by mouth daily. Do NOT take with Xarelto (just one or the other) 30 tablet    calcium carbonate (OSCAL) 1500 (  600 Ca) MG TABS tablet Take by mouth.      HORSE CHESTNUT PO Take 1 tablet by mouth daily.     levothyroxine (SYNTHROID) 50 MCG tablet Take 1 tablet (50 mcg total) by mouth daily  before breakfast. 90 tablet 3   meloxicam (MOBIC) 7.5 MG tablet Take 7.5 mg by mouth 2 (two) times daily as needed.     methocarbamol (ROBAXIN) 750 MG tablet Take 750 mg by mouth 2 (two) times daily as needed.     metoprolol succinate (TOPROL-XL) 50 MG 24 hr tablet Take 1 tablet (50 mg total) by mouth daily. Take with or immediately following a meal. 90 tablet 3   polyethylene glycol powder (GLYCOLAX/MIRALAX) powder Take 17 g by mouth daily. Dissolve 1 capful in 8 oz of liquid daily PRN (Patient taking differently: Take 17 g by mouth daily as needed (constipation.).) 3350 g 1   vitamin C (ASCORBIC ACID) 500 MG tablet Take 500 mg by mouth daily.     HYDROcodone-acetaminophen (NORCO/VICODIN) 5-325 MG tablet Take 1 tablet by mouth every 4 (four) hours. (Patient not taking: Reported on 04/26/2021)     mupirocin ointment (BACTROBAN) 2 % Apply 1 application topically 2 (two) times daily as needed. Apply to affected skin (Patient not taking: Reported on 04/26/2021) 22 g 1   rosuvastatin (CRESTOR) 10 MG tablet Take 1 tablet (10 mg total) by mouth daily. (Patient not taking: Reported on 04/26/2021) 90 tablet 3   No current facility-administered medications for this visit.     Allergies:   Anaprox [naproxen sodium]   Social History:  The patient  reports that she has never smoked. She has never used smokeless tobacco. She reports that she does not drink alcohol and does not use drugs.   Family History:   family history includes Diabetes in her mother; Healthy in her sister; Heart disease in her father; Heart failure in her mother; Hypertension in her father and mother; Thyroid disease in her sister.    Review of Systems: Review of Systems  Constitutional: Negative.   Respiratory: Negative.    Cardiovascular: Negative.   Gastrointestinal: Negative.   Musculoskeletal: Negative.   Neurological: Negative.   Psychiatric/Behavioral: Negative.    All other systems reviewed and are negative.   PHYSICAL  EXAM: VS:  BP (!) 178/102 (BP Location: Right Arm, Patient Position: Sitting, Cuff Size: Normal)    Pulse 85    Ht 5\' 9"  (1.753 m)    Wt 224 lb (101.6 kg)    SpO2 99%    BMI 33.08 kg/m  , BMI Body mass index is 33.08 kg/m. GEN: Well nourished, well developed, in no acute distress HEENT: normal Neck: no JVD, carotid bruits, or masses Cardiac: Irregularly irregular; no murmurs, rubs, or gallops,no edema  Respiratory:  clear to auscultation bilaterally, normal work of breathing GI: soft, nontender, nondistended, + BS MS: no deformity or atrophy Skin: warm and dry, no rash Neuro:  Strength and sensation are intact Psych: euthymic mood, full affect  Recent Labs: 10/28/2020: ALT 15; BUN 25; Creat 0.83; Potassium 4.3; Sodium 139; TSH 0.96    Lipid Panel Lab Results  Component Value Date   CHOL 217 (H) 10/28/2020   HDL 71 10/28/2020   LDLCALC 130 (H) 10/28/2020   TRIG 64 10/28/2020      Wt Readings from Last 3 Encounters:  04/26/21 224 lb (101.6 kg)  09/28/20 217 lb 9.6 oz (98.7 kg)  03/19/20 206 lb (93.4 kg)    ASSESSMENT AND PLAN:  Problem List Items Addressed This Visit       Cardiology Problems   Hypertension (Chronic)   Aortic atherosclerosis (HCC)     Other   Dyslipidemia   Prediabetes   Other Visit Diagnoses     Longstanding persistent atrial fibrillation (HCC)    -  Primary      Permanent atrial fibrillation EKGs dating back to 2015 documenting atrial fibrillation Long discussion with her concerning risk and benefit of anticoagulation After discussion, she will start Eliquis 5 twice daily, stop aspirin Rate relatively well controlled on no medication apart from metoprolol succinate 50 daily.  For any tachypalpitations, recommend she take extra metoprolol succinate 25 as needed No attempt will be made to restore normal sinus rhythm as likely permanent, and relatively asymptomatic We did discuss echocardiogram for any worsening shortness of breath or leg  swelling  Essential hypertension Anxious on todays visit, blood pressure well above her baseline Recommend close monitoring of blood pressure at home and call us with some measurements If needed could go up on her metoprolol dosing or add additional agents such as ARB  Hyperlipidemia Discussed briefly, she prefers lifestyle modification rather than medications at this time Will follow-up on her next visit   Total encounter time more than 60 minutes  Greater than 50% was spent in counseling and coordination of care with the patient    Signed, Esmond Plants, M.D., Ph.D. [place], [place]

## 2021-04-26 NOTE — Patient Instructions (Addendum)
Medication Instructions:  Please Stop  aspirin Please start  eliquis 5 mg twice a day Please register co-pay card to see if you qualify Please register for free 30-day trail For boxes of samples Lot: [id number] Exp: 12/2022   Stay on metoprolol succinate 50 daily Please take extra 1/2 dose as needed for high blood pressure or fast heart rate  If you need a refill on your cardiac medications before your next appointment, please call your pharmacy.   Lab work: No new labs needed  Testing/Procedures: No new testing needed  Follow-Up: At [facility], you and your health needs are our priority.  As part of our continuing mission to provide you with exceptional heart care, we have created designated Provider Care Teams.  These Care Teams include your primary Cardiologist (physician) and Advanced Practice Providers (APPs -  Physician Assistants and Nurse Practitioners) who all work together to provide you with the care you need, when you need it.  You will need a follow up appointment in 6 months  Providers on your designated Care Team:   Murray Hodgkins, NP Christell Faith, PA-C Cadence Kathlen Mody, [place]  COVID-19 Vaccine Information can be found at: [email or web address] For questions related to vaccine distribution or appointments, please email [email or web address] or call [phone].

## 2021-05-03 ENCOUNTER — Ambulatory Visit: Payer: Medicare Other | Admitting: Family Medicine

## 2021-05-05 ENCOUNTER — Encounter: Payer: Self-pay | Admitting: Nurse Practitioner

## 2021-05-05 ENCOUNTER — Ambulatory Visit: Payer: Medicare Other | Admitting: Nurse Practitioner

## 2021-05-05 VITALS — BP 150/90 | HR 97 | Temp 98.1°F | Resp 16 | Ht 69.0 in | Wt 226.7 lb

## 2021-05-05 DIAGNOSIS — E785 Hyperlipidemia, unspecified: Secondary | ICD-10-CM | POA: Diagnosis not present

## 2021-05-05 DIAGNOSIS — M816 Localized osteoporosis [Lequesne]: Secondary | ICD-10-CM

## 2021-05-05 DIAGNOSIS — I10 Essential (primary) hypertension: Secondary | ICD-10-CM | POA: Diagnosis not present

## 2021-05-05 DIAGNOSIS — E039 Hypothyroidism, unspecified: Secondary | ICD-10-CM

## 2021-05-05 DIAGNOSIS — I480 Paroxysmal atrial fibrillation: Secondary | ICD-10-CM | POA: Diagnosis not present

## 2021-05-05 DIAGNOSIS — I700 Atherosclerosis of aorta: Secondary | ICD-10-CM | POA: Diagnosis not present

## 2021-05-05 MED ORDER — ALENDRONATE SODIUM 70 MG PO TABS: ORAL_TABLET | ORAL | 1 refills | Status: DC

## 2021-05-05 MED ORDER — LEVOTHYROXINE SODIUM 50 MCG PO TABS: 50.0000 ug | ORAL_TABLET | Freq: Every day | ORAL | 3 refills | Status: DC

## 2021-05-05 MED ORDER — METOPROLOL SUCCINATE ER 50 MG PO TB24: 50.0000 mg | ORAL_TABLET | Freq: Every day | ORAL | 3 refills | Status: DC

## 2021-05-05 NOTE — Progress Notes (Addendum)
BP (!) 150/90    Pulse 97    Temp 98.1 F (36.7 C) (Oral)    Resp 16    Ht '5\' 9"'  (1.753 m)    Wt 226 lb 11.2 oz (102.8 kg)    SpO2 97%    BMI 33.48 kg/m    Subjective:    [name], female    DOB: [birth year], 76 y.o.   MRN: [id number]  HPI: Rebecca Anthony is a 76 y.o. female, here alone  Chief Complaint  Patient presents with   Follow-up   Hyperlipidemia   Hypertension   Hyperlipidemia: She says she never started taking the rosuvastatin.  She says she tries to control her cholesterol with diet.  Her last LDL was 130 on 10/28/20. She says she does not want to take cholesterol medication.  Will get labs. The 10-year ASCVD risk score (Arnett DK, et al., 2019) is: 24.5%   Values used to calculate the score:     Age: 83 years     Sex: Female     Is Non-Hispanic African American: Yes     Diabetic: No     Tobacco smoker: No     Systolic Blood Pressure: 500 mmHg     Is BP treated: Yes     HDL Cholesterol: 71 mg/dL     Total Cholesterol: 217 mg/dL   Hypertension: Blood pressure 120/79 at home, she says that she gets nervous coming to the doctor. Her blood pressure is 150/90 after being checked twice.  She denies any chest pain, shortness of breath, headaches or blurred vision. She says her blood pressure just runs a little high when she comes to the doctor. She is currently taking metoprolol 50 mg daily.  She also saw her cardiologist Dr. Rockey Situ on 04/26/2021.  Aortic atherosclerosis:  She is not currently taking a statin.  Aortic atherosclerosis was found on ct on 04/19/2016. Discussed statin and she is not interested in taking more medication.  Atrial fibrillation:  She saw her cardiologist Dr. Rockey Situ on 04/26/2021.  She is currently taking Eliquis 5 mg twice a day.  He also suggested that if she has tachycardic palpitations to take an extra metoprolol 25 mg. She is constantly in atrial fibrillation.    Hypothyroidism:  She says she takes levothyroxine 50 mcg daily.  She  denies any weight loss, changes in hair or skin or fatigue. She says she feels pretty good. She says she does occasionally have constipation. Discussed increasing fiber in her diet and drinking plenty of fluids.   Osteoporosis:  She is currently taking alendronate 70 mg weekly.  She says she also takes Vitamin D and calcium.  She is up to date on her bone density.   Relevant past medical, surgical, family and social history reviewed and updated as indicated. Interim medical history since our last visit reviewed. Allergies and medications reviewed and updated.  Review of Systems  Constitutional: Negative for fever or weight change.  Respiratory: Negative for cough and shortness of breath.   Cardiovascular: Negative for chest pain, positive palpitations.  Gastrointestinal: Negative for abdominal pain, no bowel changes.  Musculoskeletal: Negative for gait problem (uses cane) or joint swelling.  Skin: Negative for rash.  Neurological: Negative for dizziness or headache.  No other specific complaints in a complete review of systems (except as listed in HPI above).      Objective:    BP (!) 150/90    Pulse 97    Temp 98.1  F (36.7 C) (Oral)    Resp 16    Ht '5\' 9"'  (1.753 m)    Wt 226 lb 11.2 oz (102.8 kg)    SpO2 97%    BMI 33.48 kg/m   Wt Readings from Last 3 Encounters:  05/05/21 226 lb 11.2 oz (102.8 kg)  04/26/21 224 lb (101.6 kg)  09/28/20 217 lb 9.6 oz (98.7 kg)    Physical Exam  Constitutional: Patient appears well-developed and well-nourished. Obese No distress.  HEENT: head atraumatic, normocephalic, pupils equal and reactive to light, neck supple Cardiovascular: Normal rate, irregular rhythm and normal heart sounds.  No murmur heard. Positive for bilateral lower extremity edema +1. Pulmonary/Chest: Effort normal and breath sounds normal. No respiratory distress. Abdominal: Soft.  There is no tenderness. Psychiatric: Patient has a normal mood and affect. behavior is normal.  Judgment and thought content normal.   Results for orders placed or performed in visit on 09/28/20  TSH  Result Value Ref Range   TSH 0.96 0.40 - 4.50 mIU/L  Lipid panel  Result Value Ref Range   Cholesterol 217 (H) <200 mg/dL   HDL 71 > OR = 50 mg/dL   Triglycerides 64 <150 mg/dL   LDL Cholesterol (Calc) 130 (H) mg/dL (calc)   Total CHOL/HDL Ratio 3.1 <5.0 (calc)   Non-HDL Cholesterol (Calc) 146 (H) <130 mg/dL (calc)  COMPLETE METABOLIC PANEL WITH GFR  Result Value Ref Range   Glucose, Bld 97 65 - 99 mg/dL   BUN 25 7 - 25 mg/dL   Creat 0.83 0.60 - 1.00 mg/dL   eGFR 73 > OR = 60 mL/min/1.75m   BUN/Creatinine Ratio NOT APPLICABLE 6 - 22 (calc)   Sodium 139 135 - 146 mmol/L   Potassium 4.3 3.5 - 5.3 mmol/L   Chloride 106 98 - 110 mmol/L   CO2 25 20 - 32 mmol/L   Calcium 9.5 8.6 - 10.4 mg/dL   Total Protein 6.8 6.1 - 8.1 g/dL   Albumin 4.3 3.6 - 5.1 g/dL   Globulin 2.5 1.9 - 3.7 g/dL (calc)   AG Ratio 1.7 1.0 - 2.5 (calc)   Total Bilirubin 0.5 0.2 - 1.2 mg/dL   Alkaline phosphatase (APISO) 72 37 - 153 U/L   AST 22 10 - 35 U/L   ALT 15 6 - 29 U/L      Assessment & Plan:   1. Dyslipidemia -consider statin - Lipid panel - COMPLETE METABOLIC PANEL WITH GFR  2. Aortic atherosclerosis ([place] -consider statin  3. Essential hypertension  - CBC with Differential/Platelet - COMPLETE METABOLIC PANEL WITH GFR - metoprolol succinate (TOPROL-XL) 50 MG 24 hr tablet; Take 1 tablet (50 mg total) by mouth daily. Take with or immediately following a meal.  Dispense: 90 tablet; Refill: 3  4. PAF (paroxysmal atrial fibrillation) (HCC)  - metoprolol succinate (TOPROL-XL) 50 MG 24 hr tablet; Take 1 tablet (50 mg total) by mouth daily. Take with or immediately following a meal.  Dispense: 90 tablet; Refill: 3  5. Hypothyroidism, unspecified type  - TSH - levothyroxine (SYNTHROID) 50 MCG tablet; Take 1 tablet (50 mcg total) by mouth daily before breakfast.  Dispense: 90 tablet; Refill:  3  6. Localized osteoporosis without current pathological fracture  - alendronate (FOSAMAX) 70 MG tablet; TAKE 1 TABLET (70 MG TOTAL) BY MOUTH EVERY 7 DAYS WITH FULL GLASS WATER ON EMPTY STOMACH  Dispense: 4 tablet; Refill: 1   Follow up plan: Return in about 6 months (around 11/02/2021) for follow up.

## 2021-05-11 ENCOUNTER — Telehealth: Payer: Self-pay | Admitting: Podiatry

## 2021-05-11 NOTE — Telephone Encounter (Signed)
Orthotics in [place] to be taken [place].. Lvm for pt to call to schedule an appt with Aaron Edelman in [place] office.. next available as of now is 2.10.23

## 2021-05-16 ENCOUNTER — Telehealth: Payer: Self-pay | Admitting: Nurse Practitioner

## 2021-05-16 ENCOUNTER — Ambulatory Visit: Payer: Self-pay

## 2021-05-16 NOTE — Telephone Encounter (Signed)
Copied from [place] [id number]. Topic: General - Other >> May 16, 2021  9:13 AM Leward Quan A wrote: Reason for CRM: Patient called in to inquire of Alycia Rossetti if she can come in with her husband for a visit at the same time on 05/17/21 since she is also having symptoms. Asking if this is possible that she come with him during his visit and be checked. Please advise and call  Ph# [phone]

## 2021-05-16 NOTE — Telephone Encounter (Signed)
Chief Complaint: Cough Symptoms: Runny nose, fatigue, scratchy throat Frequency: Since last Wednesday Pertinent Negatives: Patient denies fever, SOB Disposition: [] ED /[] Urgent Care (no appt availability in office) / [x] Appointment(In office/virtual)/ []  [place] [facility]/ [] Home Care/ [] Refused Recommended Disposition /[] [place] [facility]/ []  Follow-up with PCP Additional Notes: Patient says she would like to have a COVID test and wants to be scheduled at the same time as her husband. Melissa, [place] called and she scheduled patient for tomorrow at 1 pm with Serafina Royals, NP in office. Patient advised of appointment.    Reason for Disposition  [1] Continuous (nonstop) coughing interferes with work or school AND [2] no improvement using cough treatment per Care Advice  Answer Assessment - Initial Assessment Questions 1. ONSET: "When did the cough begin?"      Last Wednesday 2. SEVERITY: "How bad is the cough today?"      Not bad 3. SPUTUM: "Describe the color of your sputum" (none, dry cough; clear, white, yellow, green)     Yellow 4. HEMOPTYSIS: "Are you coughing up any blood?" If so ask: "How much?" (flecks, streaks, tablespoons, etc.)     No 5. DIFFICULTY BREATHING: "Are you having difficulty breathing?" If Yes, ask: "How bad is it?" (e.g., mild, moderate, severe)    - MILD: No SOB at rest, mild SOB with walking, speaks normally in sentences, can lie down, no retractions, pulse < 100.    - MODERATE: SOB at rest, SOB with minimal exertion and prefers to sit, cannot lie down flat, speaks in phrases, mild retractions, audible wheezing, pulse 100-120.    - SEVERE: Very SOB at rest, speaks in single words, struggling to breathe, sitting hunched forward, retractions, pulse > 120      No 6. FEVER: "Do you have a fever?" If Yes, ask: "What is your temperature, how was it measured, and when did it start?"     No 7. LUNG HISTORY: "Do you have any history of lung disease?"  (e.g.,  pulmonary embolus, asthma, emphysema)     No 9. OTHER SYMPTOMS: "Do you have any other symptoms?" (e.g., runny nose, wheezing, chest pain)      Runny nose, scratchy throat, fatigue  Protocols used: Cough - Acute Productive-A-AH

## 2021-05-16 NOTE — Telephone Encounter (Signed)
See NT encounter, appt tomorrow at 1 pm.

## 2021-05-17 ENCOUNTER — Ambulatory Visit: Payer: Medicare Other | Admitting: Nurse Practitioner

## 2021-05-17 ENCOUNTER — Encounter: Payer: Self-pay | Admitting: Nurse Practitioner

## 2021-05-17 ENCOUNTER — Other Ambulatory Visit: Payer: Self-pay

## 2021-05-17 VITALS — BP 136/74 | HR 96 | Temp 98.3°F | Resp 16 | Ht 69.0 in | Wt 221.5 lb

## 2021-05-17 DIAGNOSIS — J069 Acute upper respiratory infection, unspecified: Secondary | ICD-10-CM

## 2021-05-17 LAB — POCT INFLUENZA A/B
Influenza A, POC: NEGATIVE
Influenza B, POC: NEGATIVE

## 2021-05-17 NOTE — Progress Notes (Signed)
BP 136/74    Pulse 96    Temp 98.3 F (36.8 C) (Oral)    Resp 16    Ht _0  (1.753 m)    Wt 221 lb 8 oz (100.5 kg)    SpO2 98%    BMI 32.71 kg/m    Subjective:    [name], female    DOB: [birth year], 76 y.o.   MRN: [id number]  HPI: Rebecca Anthony is a 76 y.o. female, here with her husband  Chief Complaint  Patient presents with   URI    Cough and cold symptoms 4 days   URI:  She says symptoms started Wednesday.  She says her and her husband did home Covid tests.  Her's was negative and his was positive. She says her symptoms included fatigue, nasal congestion and cough.  She denies any fever or shortness of breath.  She says she has been taking Nyquil and it has helped.  They have already been quarantining.  Discussed OTC treatments for symptoms. Discussed pushing fluids and getting rest. Will do a Covid PCR and flu test. Flu test was negative.   Relevant past medical, surgical, family and social history reviewed and updated as indicated. Interim medical history since our last visit reviewed. Allergies and medications reviewed and updated.  Review of Systems  Constitutional: Negative for fever or weight change. Positive for fatigue Respiratory: Positive for cough and negative for shortness of breath.   Cardiovascular: Negative for chest pain or palpitations.  Gastrointestinal: Negative for abdominal pain, no bowel changes.  Musculoskeletal: Negative for gait problem or joint swelling.  Skin: Negative for rash.  Neurological: Negative for dizziness or headache.  No other specific complaints in a complete review of systems (except as listed in HPI above).      Objective:    BP 136/74    Pulse 96    Temp 98.3 F (36.8 C) (Oral)    Resp 16    Ht _1  (1.753 m)    Wt 221 lb 8 oz (100.5 kg)    SpO2 98%    BMI 32.71 kg/m   Wt Readings from Last 3 Encounters:  05/17/21 221 lb 8 oz (100.5 kg)  05/05/21 226 lb 11.2 oz (102.8 kg)  04/26/21 224 lb (101.6 kg)     Physical Exam  Constitutional: Patient appears well-developed and well-nourished. Obese  No distress.  HEENT: head atraumatic, normocephalic, pupils equal and reactive to light, ears TMs clear, neck supple, throat within normal limits Cardiovascular: Normal rate, regular rhythm and normal heart sounds.  No murmur heard. No BLE edema. Pulmonary/Chest: Effort normal and breath sounds normal. No respiratory distress. Abdominal: Soft.  There is no tenderness. Psychiatric: Patient has a normal mood and affect. behavior is normal. Judgment and thought content normal.   Results for orders placed or performed in visit on 09/28/20  TSH  Result Value Ref Range   TSH 0.96 0.40 - 4.50 mIU/L  Lipid panel  Result Value Ref Range   Cholesterol 217 (H) <200 mg/dL   HDL 71 > OR = 50 mg/dL   Triglycerides 64 <150 mg/dL   LDL Cholesterol (Calc) 130 (H) mg/dL (calc)   Total CHOL/HDL Ratio 3.1 <5.0 (calc)   Non-HDL Cholesterol (Calc) 146 (H) <130 mg/dL (calc)  COMPLETE METABOLIC PANEL WITH GFR  Result Value Ref Range   Glucose, Bld 97 65 - 99 mg/dL   BUN 25 7 - 25 mg/dL   Creat 0.83 0.60 -  1.00 mg/dL   eGFR 73 > OR = 60 mL/min/1.33m   BUN/Creatinine Ratio NOT APPLICABLE 6 - 22 (calc)   Sodium 139 135 - 146 mmol/L   Potassium 4.3 3.5 - 5.3 mmol/L   Chloride 106 98 - 110 mmol/L   CO2 25 20 - 32 mmol/L   Calcium 9.5 8.6 - 10.4 mg/dL   Total Protein 6.8 6.1 - 8.1 g/dL   Albumin 4.3 3.6 - 5.1 g/dL   Globulin 2.5 1.9 - 3.7 g/dL (calc)   AG Ratio 1.7 1.0 - 2.5 (calc)   Total Bilirubin 0.5 0.2 - 1.2 mg/dL   Alkaline phosphatase (APISO) 72 37 - 153 U/L   AST 22 10 - 35 U/L   ALT 15 6 - 29 U/L      Assessment & Plan:   1. Viral upper respiratory tract infection -push fluids, get rest -discussed OTC treatments for symptoms - Novel Coronavirus, NAA ([facility]) - POCT Influenza A/B     Follow up plan: Return if symptoms worsen or fail to improve.

## 2021-05-19 ENCOUNTER — Telehealth: Payer: Self-pay | Admitting: Cardiovascular Disease

## 2021-05-19 LAB — NOVEL CORONAVIRUS, NAA: SARS-CoV-2, NAA: DETECTED — AB

## 2021-05-19 LAB — SARS-COV-2, NAA 2 DAY TAT

## 2021-05-19 NOTE — Telephone Encounter (Signed)
Return call to Rebecca Anthony regarding her meloxicam taking with Eliquis, advise on increase risk of bleeding, also commented on PharmD's advice  Meloxicam typically not recommend due to increased CV and bleeding risk. Would recommend she use Tylenol instead if able. If she does need meloxicam occasionally, should use lowest effective dose for short duration and take with food.  Rebecca Anthony verbalized understanding, will consult [facility] at her next visit as well regarding pain medication safer with Eliquis. Thankful for return call and information, will call back with any further concerns.

## 2021-05-19 NOTE — Telephone Encounter (Signed)
Patient would like to know if she can take Meloxicam with her Eliquis. Please call to discuss.

## 2021-05-19 NOTE — Telephone Encounter (Signed)
Meloxicam typically not recommend due to increased CV and bleeding risk. Would recommend she use Tylenol instead if able. If she does need meloxicam occasionally, should use lowest effective dose for short duration and take with food.

## 2021-05-20 ENCOUNTER — Ambulatory Visit: Payer: Medicare Other | Admitting: Nurse Practitioner

## 2021-05-22 NOTE — Progress Notes (Deleted)
Cardiology Office Note  Date:  05/22/2021   ID:  Rebecca Anthony, Rebecca Anthony April 15, 1946, MRN [id number]  PCP:  Bo Merino, FNP   No chief complaint on file.   HPI:  Rebecca Anthony is a 76 year old woman with past medical history of Hyperlipidemia Permanent atrial fibrillation  Who presents for follow-up of her aortic atherosclerosis, hyperlipidemia, atrial fibrillation  Last seen in clinic by myself April 26, 2021 Blood pressure elevated on that visit     CT abdomen pelvis January 2018  Echocardiogram 2015, normal ejection function  Lab work reviewed Total cholesterol 217 LDL 130  On discussion of her atrial fibrillation history, she reports prior history of atrial fibrillation, treated with quinidine when she was living in [place].  She stopped taking it for some reason  2015, episode of rapid A. fib following her colonoscopy at [facility].  started on metoprolol for rate control,  Several EKGs since that time 2015, 2017, 2019 documenting atrial fibrillation on my review In atrial fibrillation on today's visit, reports that occasionally she does feel palpitations but most the time is asymptomatic  Sedentary, denies falls, presents in a wheelchair  EKG personally reviewed by myself on todays visit Atrial fibrillation ventricular rate 85 bpm   PMH:   has a past medical history of Chronic abdominal pain (LLQ) (Primary Area of Pain) (Left) (10/31/2017), Chronic ankle pain (Fourth Area of Pain) (Left) (11/07/2017), Chronic low back pain (Secondary Area of Pain) (Bilateral) (L>R) (10/31/2017), Chronic upper back pain (Tertiary Area of Pain) (Bilateral) (L>R) (10/31/2017), Diverticulitis, Diverticulosis, Dysuria, Heart murmur, Hematuria, unspecified, History of shingles, History of swelling of feet, Hypercholesterolemia, Hypertension, Hypothyroidism, Irritable bowel syndrome, Lichenification and lichen simplex chronicus, Localized superficial swelling, mass, or lump, Onychomycosis  (03/23/2016), Osteoarthrosis, unspecified whether generalized or localized, unspecified site, Osteoporosis, unspecified, PAF (paroxysmal atrial fibrillation) ([place]), Sigmoid diverticulosis (11/28/2017), Sinus problem, Synovial cyst, unspecified, and 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  PSH:    Past Surgical History:  Procedure Laterality Date   ABDOMINAL HYSTERECTOMY     ANKLE SURGERY Left    Torned tendon   APPENDECTOMY     AV FISTULA REPAIR     BUNIONECTOMY     CATARACT EXTRACTION W/PHACO Right 01/02/2019   Procedure: CATARACT EXTRACTION PHACO AND INTRAOCULAR LENS PLACEMENT ([place]) RIGHT;  Surgeon: Birder Robson, MD;  Location: [facility] ORS;  Service: Ophthalmology;  Laterality: Right;  [place] 00:42 CDE 7.91 fluid pack lot # [id number]   CATARACT EXTRACTION W/PHACO Left 02/07/2019   Procedure: CATARACT EXTRACTION PHACO AND INTRAOCULAR LENS PLACEMENT ([place]) LEFT;  Surgeon: Birder Robson, MD;  Location: [facility] ORS;  Service: Ophthalmology;  Laterality: Left;  [place] 00:36.3CDE 5.01Fluid Pack Lot # [id number]   COLONOSCOPY     FOOT SURGERY Left    VAGINAL HYSTERECTOMY     VESICOVAGINAL FISTULA CLOSURE W/ TAH      Current Outpatient Medications  Medication Sig Dispense Refill   alendronate (FOSAMAX) 70 MG tablet TAKE 1 TABLET (70 MG TOTAL) BY MOUTH EVERY 7 DAYS WITH FULL GLASS WATER ON EMPTY STOMACH 4 tablet 1   apixaban (ELIQUIS) 5 MG TABS tablet Take 1 tablet (5 mg total) by mouth 2 (two) times daily. 180 tablet 3   calcium carbonate (OSCAL) 1500 (600 Ca) MG TABS tablet Take by mouth.      HORSE CHESTNUT PO Take 1 tablet by mouth daily.     levothyroxine (SYNTHROID) 50 MCG tablet Take 1 tablet (50 mcg total) by mouth daily before breakfast. 90 tablet 3   meloxicam (  MOBIC) 7.5 MG tablet Take 7.5 mg by mouth 2 (two) times daily as needed.     methocarbamol (ROBAXIN) 750 MG tablet Take 750 mg by mouth 2 (two) times daily as needed.     metoprolol succinate (TOPROL-XL) 50 MG 24 hr tablet Take 1 tablet  (50 mg total) by mouth daily. Take with or immediately following a meal. 90 tablet 3   polyethylene glycol powder (GLYCOLAX/MIRALAX) powder Take 17 g by mouth daily. Dissolve 1 capful in 8 oz of liquid daily PRN (Patient taking differently: Take 17 g by mouth daily as needed (constipation.).) 3350 g 1   vitamin C (ASCORBIC ACID) 500 MG tablet Take 500 mg by mouth daily.     No current facility-administered medications for this visit.     Allergies:   Anaprox [naproxen sodium]   Social History:  The patient  reports that she has never smoked. She has never used smokeless tobacco. She reports that she does not drink alcohol and does not use drugs.   Family History:   family history includes Diabetes in her mother; Healthy in her sister; Heart disease in her father; Heart failure in her mother; Hypertension in her father and mother; Thyroid disease in her sister.    Review of Systems: Review of Systems  Constitutional: Negative.   Respiratory: Negative.    Cardiovascular: Negative.   Gastrointestinal: Negative.   Musculoskeletal: Negative.   Neurological: Negative.   Psychiatric/Behavioral: Negative.    All other systems reviewed and are negative.   PHYSICAL EXAM: VS:  There were no vitals taken for this visit. , BMI There is no height or weight on file to calculate BMI. GEN: Well nourished, well developed, in no acute distress HEENT: normal Neck: no JVD, carotid bruits, or masses Cardiac: Irregularly irregular; no murmurs, rubs, or gallops,no edema  Respiratory:  clear to auscultation bilaterally, normal work of breathing GI: soft, nontender, nondistended, + BS MS: no deformity or atrophy Skin: warm and dry, no rash Neuro:  Strength and sensation are intact Psych: euthymic mood, full affect  Recent Labs: 10/28/2020: ALT 15; BUN 25; Creat 0.83; Potassium 4.3; Sodium 139; TSH 0.96    Lipid Panel Lab Results  Component Value Date   CHOL 217 (H) 10/28/2020   HDL 71 10/28/2020    LDLCALC 130 (H) 10/28/2020   TRIG 64 10/28/2020      Wt Readings from Last 3 Encounters:  05/17/21 221 lb 8 oz (100.5 kg)  05/05/21 226 lb 11.2 oz (102.8 kg)  04/26/21 224 lb (101.6 kg)    ASSESSMENT AND PLAN:  Problem List Items Addressed This Visit   None Permanent atrial fibrillation EKGs dating back to 2015 documenting atrial fibrillation Long discussion with her concerning risk and benefit of anticoagulation After discussion, she will start Eliquis 5 twice daily, stop aspirin Rate relatively well controlled on no medication apart from metoprolol succinate 50 daily.  For any tachypalpitations, recommend she take extra metoprolol succinate 25 as needed No attempt will be made to restore normal sinus rhythm as likely permanent, and relatively asymptomatic We did discuss echocardiogram for any worsening shortness of breath or leg swelling  Essential hypertension Anxious on todays visit, blood pressure well above her baseline Recommend close monitoring of blood pressure at home and call us with some measurements If needed could go up on her metoprolol dosing or add additional agents such as ARB  Hyperlipidemia Discussed briefly, she prefers lifestyle modification rather than medications at this time Will follow-up on her  next visit   Total encounter time more than 60 minutes  Greater than 50% was spent in counseling and coordination of care with the patient    Signed, Esmond Plants, M.D., Ph.D. [place], [place]

## 2021-05-23 ENCOUNTER — Ambulatory Visit: Payer: Medicare Other | Admitting: Cardiovascular Disease

## 2021-05-23 ENCOUNTER — Telehealth: Payer: Self-pay | Admitting: Cardiovascular Disease

## 2021-05-23 NOTE — Telephone Encounter (Signed)
Responded to pt via MyChart message. Advised on "moderation" Apologize that Dr. Rockey Situ does not take direct calls.  Can discuss further at 2/20 appt

## 2021-05-23 NOTE — Telephone Encounter (Signed)
  Patient Consent for Virtual Visit        Rebecca Anthony has provided verbal consent on 05/23/2021 for a virtual visit (video or telephone).   CONSENT FOR VIRTUAL VISIT FOR:  Rebecca Anthony  By participating in this virtual visit I agree to the following:  I hereby voluntarily request, consent and authorize [facility] and its employed or contracted physicians, physician assistants, nurse practitioners or other licensed health care professionals (the Practitioner), to provide me with telemedicine health care services (the "Services") as deemed necessary by the treating Practitioner. I acknowledge and consent to receive the Services by the Practitioner via telemedicine. I understand that the telemedicine visit will involve communicating with the Practitioner through live audiovisual communication technology and the disclosure of certain medical information by electronic transmission. I acknowledge that I have been given the opportunity to request an in-person assessment or other available alternative prior to the telemedicine visit and am voluntarily participating in the telemedicine visit.  I understand that I have the right to withhold or withdraw my consent to the use of telemedicine in the course of my care at any time, without affecting my right to future care or treatment, and that the Practitioner or I may terminate the telemedicine visit at any time. I understand that I have the right to inspect all information obtained and/or recorded in the course of the telemedicine visit and may receive copies of available information for a reasonable fee.  I understand that some of the potential risks of receiving the Services via telemedicine include:  Delay or interruption in medical evaluation due to technological equipment failure or disruption; Information transmitted may not be sufficient (e.g. poor resolution of images) to allow for appropriate medical decision making by the Practitioner; and/or   In rare instances, security protocols could fail, causing a breach of personal health information.  Furthermore, I acknowledge that it is my responsibility to provide information about my medical history, conditions and care that is complete and accurate to the best of my ability. I acknowledge that Practitioner's advice, recommendations, and/or decision may be based on factors not within their control, such as incomplete or inaccurate data provided by me or distortions of diagnostic images or specimens that may result from electronic transmissions. I understand that the practice of medicine is not an exact science and that Practitioner makes no warranties or guarantees regarding treatment outcomes. I acknowledge that a copy of this consent can be made available to me via my patient portal ([place]), or I can request a printed copy by calling the office of [facility].    I understand that my insurance will be billed for this visit.   I have read or had this consent read to me. I understand the contents of this consent, which adequately explains the benefits and risks of the Services being provided via telemedicine.  I have been provided ample opportunity to ask questions regarding this consent and the Services and have had my questions answered to my satisfaction. I give my informed consent for the services to be provided through the use of telemedicine in my medical care

## 2021-05-23 NOTE — Telephone Encounter (Addendum)
Patient calling to discuss again additional concerns and wants Dr Rockey Situ to define "moderation"   Added to waitlist per patient request but still wants a call back to discuss details of moderation.   Patient also notes she took Dr. Rockey Situ at his word that she could call anytime and he would personally be available to answer her directly .

## 2021-06-01 ENCOUNTER — Ambulatory Visit: Payer: Medicare Other | Admitting: Podiatry

## 2021-06-06 ENCOUNTER — Telehealth: Payer: Medicare Other | Admitting: Cardiovascular Disease

## 2021-06-13 ENCOUNTER — Ambulatory Visit (INDEPENDENT_AMBULATORY_CARE_PROVIDER_SITE_OTHER): Payer: Medicare Other | Admitting: Podiatry

## 2021-06-13 ENCOUNTER — Other Ambulatory Visit: Payer: Self-pay

## 2021-06-13 ENCOUNTER — Encounter: Payer: Self-pay | Admitting: Podiatry

## 2021-06-13 DIAGNOSIS — B351 Tinea unguium: Secondary | ICD-10-CM | POA: Diagnosis not present

## 2021-06-13 DIAGNOSIS — D2371 Other benign neoplasm of skin of right lower limb, including hip: Secondary | ICD-10-CM

## 2021-06-13 DIAGNOSIS — D2372 Other benign neoplasm of skin of left lower limb, including hip: Secondary | ICD-10-CM | POA: Diagnosis not present

## 2021-06-13 DIAGNOSIS — M79676 Pain in unspecified toe(s): Secondary | ICD-10-CM

## 2021-06-13 NOTE — Progress Notes (Signed)
She presents today chief complaint of painful elongated toenails and calluses.  Objective: Pulses are palpable.  Toenails are long thick yellow dystrophic clinical mycotic no open lesions or wounds are noted.  Multiple benign lesions to the plantar aspect of the forefoot left.  Assessment: Pain in limb secondary to onychomycosis and benign lesions left foot.  Plan: Debrided all reactive hyperkeratotic tissue debrided benign lesions.  Follow-up with her as needed she will pick up her orthotics tomorrow.

## 2021-06-24 ENCOUNTER — Other Ambulatory Visit: Payer: Self-pay | Admitting: Nurse Practitioner

## 2021-06-24 DIAGNOSIS — Z1231 Encounter for screening mammogram for malignant neoplasm of breast: Secondary | ICD-10-CM

## 2021-07-07 ENCOUNTER — Telehealth: Payer: Self-pay

## 2021-07-07 NOTE — Telephone Encounter (Signed)
?  Care Management  ? ?Follow Up Note ? ? ?07/07/2021 ?Name: NGUYEN BUTLER MRN: [id number] DOB: 07-May-1945 ? ? ?Primary Care Provider: Bo Merino, FNP ?Reason for referral : Chronic Care Management ? ? ?An unsuccessful telephone outreach was attempted today. The patient was referred to the case management team for assistance with care management and care coordination.  ? ? ?Follow Up Plan:  ?A HIPAA compliant voice message was left today requesting a return call. ? ? ?Stanislav Gervase,RN ?[place]/[facility] Care Management ?[facility] ?[phone] ? ? ?

## 2021-07-08 ENCOUNTER — Telehealth: Payer: Medicare Other | Admitting: Cardiovascular Disease

## 2021-07-22 ENCOUNTER — Ambulatory Visit (INDEPENDENT_AMBULATORY_CARE_PROVIDER_SITE_OTHER): Payer: Medicare Other | Admitting: Cardiovascular Disease

## 2021-07-22 DIAGNOSIS — I700 Atherosclerosis of aorta: Secondary | ICD-10-CM | POA: Diagnosis not present

## 2021-07-22 DIAGNOSIS — E785 Hyperlipidemia, unspecified: Secondary | ICD-10-CM | POA: Diagnosis not present

## 2021-07-22 DIAGNOSIS — I482 Chronic atrial fibrillation, unspecified: Secondary | ICD-10-CM | POA: Diagnosis not present

## 2021-07-22 MED ORDER — OMEPRAZOLE MAGNESIUM 20 MG PO TBEC: 20.0000 mg | DELAYED_RELEASE_TABLET | Freq: Every day | ORAL | 3 refills | Status: DC

## 2021-07-22 NOTE — Patient Instructions (Signed)
Medication Instructions:  ?Please start omeprazole 20 mg daily ? ?If you need a refill on your cardiac medications before your next appointment, please call your pharmacy.  ? ?Lab work: ?No new labs needed ? ?Testing/Procedures: ?No new testing needed ? ?Follow-Up: ?At [facility], you and your health needs are our priority.  As part of our continuing mission to provide you with exceptional heart care, we have created designated Provider Care Teams.  These Care Teams include your primary Cardiologist (physician) and Advanced Practice Providers (APPs -  Physician Assistants and Nurse Practitioners) who all work together to provide you with the care you need, when you need it. ? ?You will need a follow up appointment in 12 months ? ?Providers on your designated Care Team:   ?Murray Hodgkins, NP ?Christell Faith, PA-C ?Cadence Kathlen Mody, PA-C ? ?COVID-19 Vaccine Information can be found at: [email or web address] For questions related to vaccine distribution or appointments, please email [email or web address] or call [phone].  ? ?

## 2021-07-22 NOTE — Progress Notes (Addendum)
?  ?  ? ?Virtual Visit via Telephone Note  ? ?This visit type was conducted due to national recommendations for restrictions regarding the COVID-19 Pandemic (e.g. social distancing) in an effort to limit this patient's exposure and mitigate transmission in our community.  Due to her co-morbid illnesses, this patient is at least at moderate risk for complications without adequate follow up.  This format is felt to be most appropriate for this patient at this time.  The patient did not have access to video technology/had technical difficulties with video requiring transitioning to audio format only (telephone).  All issues noted in this document were discussed and addressed.  No physical exam could be performed with this format.  Please refer to the patient's chart for her  consent to telehealth for [facility].  ? ?I connected with  [name] on 07/22/21 by a telephone enabled telemedicine application and verified that I am speaking with the correct person using two identifiers. ?I am contacting the patient above from our cardiology clinic office or alternate office work station to their home, ?I discussed the limitations of evaluation and management by telemedicine. The patient expressed understanding and agreed to proceed.  ? ?Evaluation Performed:  Follow-up visit ? ?Date:  07/22/2021  ? ?ID:  MAZIE FENCL, DOB 09-Mar-1946, MRN [id number] ? ?Patient Location:  ?[place] ?[place] [address]  ? ?Provider location:   ?[place], [employer] office ? ?PCP:  Bo Merino, FNP  ?Cardiologist:  [facility] ? ? ?Chief Complaint:  questions concerning meloxicam and eliquis ? ? ?History of Present Illness:   ? ?ADER FRITZE is a 76 y.o. female who presents via audio conferencing for a telehealth visit today.   ?The patient does not symptoms concerning for COVID-19 infection (fever, chills, cough, or new SHORTNESS OF BREATH).  ? ?Patient has a past medical history of ?Ms. Jaisha Villacres is  a 76 year old woman with past medical history of ?Hyperlipidemia ?Permanent atrial fibrillation  ?Who presents for follow-up of her aortic atherosclerosis, hyperlipidemia, atrial fibrillation ? ?Last seen by myself in clinic April 26, 2021 ?On that visit was started on Eliquis 5 twice daily for atrial fibrillation, permanent ?  ?We did receive a phone call in February with concerns of taking meloxicam and Eliquis ? ?Previously on hydrocodone ?Takes meloxicam twice a day for back pain ?Tylenol does not work well ? ?Has follow-up with orthopedics in the near future ? ?Prior imaging ?CT abdomen pelvis January 2018 ?  ?Echocardiogram 2015, normal ejection function ?  ?Lab work reviewed ?Total cholesterol 217 LDL 130 ?  ?prior history of atrial fibrillation, treated with quinidine when she was living in [place].  She stopped taking it for some reason ?  ?2015, episode of rapid A. fib following her colonoscopy at [facility]. ? started on metoprolol for rate control,  ?Several EKGs since that time 2015, 2017, 2019 documenting atrial fibrillation on my review ?In atrial fibrillation on today's visit, reports that occasionally she does feel palpitations but most the time is asymptomatic ?  ? ?Past Medical History:  ?Diagnosis Date  ? Chronic abdominal pain (LLQ) (Primary Area of Pain) (Left) 10/31/2017  ? Chronic ankle pain (Fourth Area of Pain) (Left) 11/07/2017  ? Chronic low back pain (Secondary Area of Pain) (Bilateral) (L>R) 10/31/2017  ? Chronic upper back pain [facility] Area of Pain) (Bilateral) (L>R) 10/31/2017  ? Diverticulitis   ? Diverticulosis   ? Dysuria   ? Heart murmur   ?  Hematuria, unspecified   ? History of shingles   ? History of swelling of feet   ? Hypercholesterolemia   ? Hypertension   ? Hypothyroidism   ? Irritable bowel syndrome   ? Lichenification and lichen simplex chronicus   ? Localized superficial swelling, mass, or lump   ? Onychomycosis 03/23/2016  ? Osteoarthrosis, unspecified whether generalized or  localized, unspecified site   ? Osteoporosis, unspecified   ? PAF (paroxysmal atrial fibrillation) ([place])   ? Diagnosed years ago -- originally on Quinidine  ? Sigmoid diverticulosis 11/28/2017  ? Sinus problem   ? Synovial cyst, unspecified   ? 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 ?Past Surgical History:  ?Procedure Laterality Date  ? ABDOMINAL HYSTERECTOMY    ? ANKLE SURGERY Left   ? Torned tendon  ? APPENDECTOMY    ? AV FISTULA REPAIR    ? BUNIONECTOMY    ? CATARACT EXTRACTION W/PHACO Right 01/02/2019  ? Procedure: CATARACT EXTRACTION PHACO AND INTRAOCULAR LENS PLACEMENT ([place]) RIGHT;  Surgeon: Birder Robson, MD;  Location: [facility] ORS;  Service: Ophthalmology;  Laterality: Right;  [place] 00:42 ?CDE 7.91 ?fluid pack lot # [id number]  ? CATARACT EXTRACTION W/PHACO Left 02/07/2019  ? Procedure: CATARACT EXTRACTION PHACO AND INTRAOCULAR LENS PLACEMENT ([place]) LEFT;  Surgeon: Birder Robson, MD;  Location: [facility] ORS;  Service: Ophthalmology;  Laterality: Left;  [place] 00:36.3CDE 5.01Fluid Pack Lot # [id number]  ? COLONOSCOPY    ? FOOT SURGERY Left   ? VAGINAL HYSTERECTOMY    ? VESICOVAGINAL FISTULA CLOSURE W/ TAH    ?  ? ? ?Allergies:   Anaprox [naproxen sodium]  ? ?Social History  ? ?Tobacco Use  ? Smoking status: Never  ? Smokeless tobacco: Never  ? Tobacco comments:  ?  smoking cessation materials not required  ?Vaping Use  ? Vaping Use: Never used  ?Substance Use Topics  ? Alcohol use: No  ? Drug use: No  ?  ? ?Current Outpatient Medications on File Prior to Visit  ?Medication Sig Dispense Refill  ? alendronate (FOSAMAX) 70 MG tablet TAKE 1 TABLET (70 MG TOTAL) BY MOUTH EVERY 7 DAYS WITH FULL GLASS WATER ON EMPTY STOMACH 4 tablet 1  ? apixaban (ELIQUIS) 5 MG TABS tablet Take 1 tablet (5 mg total) by mouth 2 (two) times daily. 180 tablet 3  ? calcium carbonate (OSCAL) 1500 (600 Ca) MG TABS tablet Take by mouth.     ? HORSE CHESTNUT PO Take 1 tablet by mouth daily.    ? levothyroxine (SYNTHROID) 50 MCG tablet Take 1 tablet (50  mcg total) by mouth daily before breakfast. 90 tablet 3  ? meloxicam (MOBIC) 7.5 MG tablet Take 7.5 mg by mouth 2 (two) times daily as needed.    ? methocarbamol (ROBAXIN) 750 MG tablet Take 750 mg by mouth 2 (two) times daily as needed.    ? metoprolol succinate (TOPROL-XL) 50 MG 24 hr tablet Take 1 tablet (50 mg total) by mouth daily. Take with or immediately following a meal. 90 tablet 3  ? polyethylene glycol powder (GLYCOLAX/MIRALAX) powder Take 17 g by mouth daily. Dissolve 1 capful in 8 oz of liquid daily PRN (Patient taking differently: Take 17 g by mouth daily as needed (constipation.).) 3350 g 1  ? vitamin C (ASCORBIC ACID) 500 MG tablet Take 500 mg by mouth daily.    ? ?No current facility-administered medications on file prior to visit.  ?  ? ?Family Hx: ?The patient's family history includes Diabetes in her mother;  Healthy in her sister; Heart disease in her father; Heart failure in her mother; Hypertension in her father and mother; Thyroid disease in her sister. There is no history of Breast cancer. ? ?ROS:   ?Please see the history of present illness.    ?Review of Systems  ?Constitutional: Negative.   ?HENT: Negative.    ?Respiratory: Negative.    ?Cardiovascular: Negative.   ?Gastrointestinal: Negative.   ?Musculoskeletal: Negative.   ?Neurological: Negative.   ?Psychiatric/Behavioral: Negative.    ?All other systems reviewed and are negative.  ? ? ?Labs/Other Tests and Data Reviewed:   ? ?Recent Labs: ?10/28/2020: ALT 15; BUN 25; Creat 0.83; Potassium 4.3; Sodium 139; TSH 0.96  ? ?Recent Lipid Panel ?Lab Results  ?Component Value Date/Time  ? CHOL 217 (H) 10/28/2020 11:57 AM  ? TRIG 64 10/28/2020 11:57 AM  ? HDL 71 10/28/2020 11:57 AM  ? CHOLHDL 3.1 10/28/2020 11:57 AM  ? LDLCALC 130 (H) 10/28/2020 11:57 AM  ? ? ?Wt Readings from Last 3 Encounters:  ?05/17/21 100.5 kg  ?05/05/21 102.8 kg  ?04/26/21 101.6 kg  ?  ? ?Exam:   ? ?Vital Signs: Vital signs may also be detailed in the HPI ?There were no  vitals taken for this visit.  ?Wt Readings from Last 3 Encounters:  ?05/17/21 100.5 kg  ?05/05/21 102.8 kg  ?04/26/21 101.6 kg  ? ?Temp Readings from Last 3 Encounters:  ?05/17/21 98.3 ?F (36.8 ?C) Karin Lieu

## 2021-07-27 ENCOUNTER — Encounter: Payer: Self-pay | Admitting: Podiatry

## 2021-07-27 ENCOUNTER — Ambulatory Visit: Payer: Medicare Other | Admitting: Podiatry

## 2021-07-27 DIAGNOSIS — G5793 Unspecified mononeuropathy of bilateral lower limbs: Secondary | ICD-10-CM

## 2021-07-27 MED ORDER — GABAPENTIN 300 MG PO CAPS: 300.0000 mg | ORAL_CAPSULE | Freq: Every day | ORAL | 3 refills | Status: DC

## 2021-07-27 NOTE — Progress Notes (Signed)
She presents today with her husband after bumping her big toe of her left foot.  She states that it turned black and blue and was concerned.  She is currently taking Eliquis for A-fib.  She also was complaining of eye and electrical type pain that is relentless in her fourth and fifth digits that occurs a lot of times at night left foot.  She does have a history of back problems and sees pain management occasionally. ? ?Objective: Vital signs stable alert and oriented x3 pulses are palpable.  She has ecchymosis overlying the first toe but without edema.  This does appear to be to be a contusion with bruising.  The toes are easily movable nontender on range of motion and palpation.  No reproducible pain on palpation fourth and fifth toes of the left foot. ? ?Assessment: Probable radiculopathy with neuritis fourth and fifth digits of the left foot.  Also contusion foot left. ? ?Plan: At this point is too soon to debride her nails I will write her a prescription today for gabapentin 300 mg 1 p.o. nightly we did discuss possible side effects she understands and is amenable to it.  Follow-up with her in the next few weeks for routine debridement. ?

## 2021-08-03 ENCOUNTER — Ambulatory Visit
Admission: RE | Admit: 2021-08-03 | Discharge: 2021-08-03 | Disposition: A | Discharge status: Home / Self Care | Payer: Medicare Other | Source: Ambulatory Visit | Attending: Nurse Practitioner | Admitting: Nurse Practitioner

## 2021-08-03 DIAGNOSIS — Z1231 Encounter for screening mammogram for malignant neoplasm of breast: Secondary | ICD-10-CM | POA: Diagnosis present

## 2021-08-10 ENCOUNTER — Ambulatory Visit: Payer: Medicare Other | Admitting: Podiatry

## 2021-08-31 ENCOUNTER — Ambulatory Visit: Payer: Medicare Other | Admitting: Podiatry

## 2021-09-14 ENCOUNTER — Ambulatory Visit: Payer: Medicare Other | Admitting: Podiatry

## 2021-09-19 ENCOUNTER — Ambulatory Visit: Payer: Medicare Other | Admitting: Podiatry

## 2021-09-19 ENCOUNTER — Encounter: Payer: Self-pay | Admitting: Podiatry

## 2021-09-19 DIAGNOSIS — D2371 Other benign neoplasm of skin of right lower limb, including hip: Secondary | ICD-10-CM

## 2021-09-19 DIAGNOSIS — D2372 Other benign neoplasm of skin of left lower limb, including hip: Secondary | ICD-10-CM | POA: Diagnosis not present

## 2021-09-19 DIAGNOSIS — D689 Coagulation defect, unspecified: Secondary | ICD-10-CM | POA: Diagnosis not present

## 2021-09-19 DIAGNOSIS — B351 Tinea unguium: Secondary | ICD-10-CM

## 2021-09-19 DIAGNOSIS — M79676 Pain in unspecified toe(s): Secondary | ICD-10-CM

## 2021-09-19 NOTE — Progress Notes (Signed)
She presents today chief complaint of painful callus subfirst metatarsophalangeal joint left foot and painful elongated toenails otherwise.  Objective: Vital signs are stable she is alert and oriented x3 pulses are palpable.  Reactive hyper keratomas present to the plantar aspect of the left foot there is no open lesions or wounds.  Toenails are long thick yellow dystrophic onychomycotic.  Assessment: Pain in limb secondary to benign skin lesion.  Pain in limb secondary to onychomycosis.  Plan: Debridement of toenails 1 through 5 bilateral.

## 2021-10-06 DIAGNOSIS — M5136 Other intervertebral disc degeneration, lumbar region: Secondary | ICD-10-CM | POA: Insufficient documentation

## 2021-10-06 DIAGNOSIS — M4150 Other secondary scoliosis, site unspecified: Secondary | ICD-10-CM | POA: Insufficient documentation

## 2021-10-06 DIAGNOSIS — M47819 Spondylosis without myelopathy or radiculopathy, site unspecified: Secondary | ICD-10-CM | POA: Insufficient documentation

## 2021-10-27 ENCOUNTER — Ambulatory Visit: Payer: Self-pay

## 2021-10-27 DIAGNOSIS — E785 Hyperlipidemia, unspecified: Secondary | ICD-10-CM

## 2021-10-27 NOTE — Patient Instructions (Signed)
Thank you for allowing the Care Management team to participate in your care.  Please do not hesitate to contact the clinic if you require additional outreach.   [name] [phone]

## 2021-10-27 NOTE — Chronic Care Management (AMB) (Signed)
Care Management    RN Visit Note  10/27/2021 Name: Rebecca Anthony MRN: [id number] DOB: Feb 25, 1946  Subjective: Rebecca Anthony is a 76 y.o. year old female who is a primary care patient of Bo Merino, FNP. The care management team was consulted for assistance with disease management and care coordination needs.    Consent to Services:   [name] was given information about Care Management services including:  Care Management services includes personalized support from designated clinical staff supervised by her physician, including individualized plan of care and coordination with other care providers 24/7 contact phone numbers for assistance for urgent and routine care needs. The patient may stop case management services at any time by phone call to the office staff.  Patient agreed to services and consent obtained.   Assessment: Review of patient past medical history, allergies, medications, health status, including review of consultants reports, laboratory and other test data, was performed as part of comprehensive evaluation and provision of chronic care management services.   SDOH (Social Determinants of Health) assessments and interventions performed: No  Care Plan  Allergies  Allergen Reactions   Anaprox [Naproxen Sodium] Rash    Outpatient Encounter Medications as of 10/27/2021  Medication Sig Note   alendronate (FOSAMAX) 70 MG tablet TAKE 1 TABLET (70 MG TOTAL) BY MOUTH EVERY 7 DAYS WITH FULL GLASS WATER ON EMPTY STOMACH    apixaban (ELIQUIS) 5 MG TABS tablet Take 1 tablet (5 mg total) by mouth 2 (two) times daily.    calcium carbonate (OSCAL) 1500 (600 Ca) MG TABS tablet Take by mouth.     gabapentin (NEURONTIN) 300 MG capsule Take 1 capsule (300 mg total) by mouth at bedtime.    HORSE CHESTNUT PO Take 1 tablet by mouth daily.    levothyroxine (SYNTHROID) 50 MCG tablet Take 1 tablet (50 mcg total) by mouth daily before breakfast.    meloxicam (MOBIC) 7.5 MG tablet  Take 7.5 mg by mouth 2 (two) times daily as needed.    methocarbamol (ROBAXIN) 750 MG tablet Take 750 mg by mouth 2 (two) times daily as needed.    metoprolol succinate (TOPROL-XL) 50 MG 24 hr tablet Take 1 tablet (50 mg total) by mouth daily. Take with or immediately following a meal.    omeprazole (PRILOSEC OTC) 20 MG tablet Take 1 tablet (20 mg total) by mouth daily.    polyethylene glycol powder (GLYCOLAX/MIRALAX) powder Take 17 g by mouth daily. Dissolve 1 capful in 8 oz of liquid daily PRN (Patient taking differently: Take 17 g by mouth daily as needed (constipation.).)    vitamin C (ASCORBIC ACID) 500 MG tablet Take 500 mg by mouth daily. 03/24/2020: Reports taking '1000mg'$  daily.    No facility-administered encounter medications on file as of 10/27/2021.    Patient Active Problem List   Diagnosis Date Noted   Chronic atrial fibrillation ([place]) 12/04/2019   Aortic atherosclerosis ([place]) 12/04/2019   Chronic sciatica of left side 10/24/2019   Dyslipidemia 05/30/2018   Prediabetes 05/14/2018   Thyroid nodule 01/28/2018   Gallbladder polyp 01/28/2018   Cyst of pancreas 01/28/2018   Foraminal stenosis of thoracic region 01/28/2018   Idiopathic scoliosis of thoracolumbar spine 11/28/2017   Chronic pain syndrome 10/31/2017   Obesity (BMI 30.0-34.9) 09/13/2017   Hematuria, microscopic 07/16/2017   Hypothyroidism 02/23/2017   Osteopenia of left femoral neck 03/23/2016   PAF (paroxysmal atrial fibrillation) ([place])    Hypertension     Care Plan : Dyslipidemia  Problem: Dyslpidemia Monitored      Long-Range Goal: Dyslipidemia Monitored Completed 10/27/2021  Note:   Current Barriers:  Care Coordination support and educational needs r/t Dyslipidemia management   Care Manager Clinical Goal(s):  Patient will work with [place], providers, and care team towards execution of optimized self-health management plan. Patient will take all medications exactly as prescribed and will  call provider for medication related questions.  Interventions: Reviewed medications and labs. Reports not taking Lipitor as prescribed. She currently prefers to adjust her diet and activity. Her cholesterol is currently elevated. She is aware of active lab orders. Plans to complete within the next few weeks. Thoroughly discussed nutrition habits and heart health food options. Encouraged to continue monitoring sodium intake. Strongly encouraged to read nutrition labels and avoid highly processed foods when possible. She is very motivated to improve her health. Reviewed s/sx of heart attack, stroke and worsening symptoms that require immediate medical attention.   Patient Goals/Self-Care Activities: Take medications as prescribed. Complete labs Adhere to a heart healthy/cardiac prudent diet Continue to engage in low impact activity as tolerated Contact provider or care management team with questions or new concerns if needed.       PLAN [name] will contact the clinic if she requires additional outreach. The care management team will gladly assist.   Horris Latino [facility] Care Management [phone]

## 2021-11-03 ENCOUNTER — Encounter: Payer: Self-pay | Admitting: Nurse Practitioner

## 2021-11-03 ENCOUNTER — Other Ambulatory Visit: Payer: Self-pay | Admitting: Nurse Practitioner

## 2021-11-03 ENCOUNTER — Ambulatory Visit: Payer: Medicare Other | Admitting: Nurse Practitioner

## 2021-11-03 VITALS — BP 136/84 | HR 98 | Temp 98.7°F | Resp 14 | Ht 69.0 in | Wt 225.5 lb

## 2021-11-03 DIAGNOSIS — I482 Chronic atrial fibrillation, unspecified: Secondary | ICD-10-CM

## 2021-11-03 DIAGNOSIS — K862 Cyst of pancreas: Secondary | ICD-10-CM

## 2021-11-03 DIAGNOSIS — R8290 Unspecified abnormal findings in urine: Secondary | ICD-10-CM | POA: Diagnosis not present

## 2021-11-03 DIAGNOSIS — M816 Localized osteoporosis [Lequesne]: Secondary | ICD-10-CM

## 2021-11-03 DIAGNOSIS — I10 Essential (primary) hypertension: Secondary | ICD-10-CM

## 2021-11-03 DIAGNOSIS — I480 Paroxysmal atrial fibrillation: Secondary | ICD-10-CM

## 2021-11-03 DIAGNOSIS — I700 Atherosclerosis of aorta: Secondary | ICD-10-CM

## 2021-11-03 DIAGNOSIS — E785 Hyperlipidemia, unspecified: Secondary | ICD-10-CM | POA: Diagnosis not present

## 2021-11-03 DIAGNOSIS — E039 Hypothyroidism, unspecified: Secondary | ICD-10-CM

## 2021-11-03 DIAGNOSIS — G894 Chronic pain syndrome: Secondary | ICD-10-CM

## 2021-11-03 DIAGNOSIS — M5432 Sciatica, left side: Secondary | ICD-10-CM

## 2021-11-03 LAB — POCT URINALYSIS DIPSTICK
Bilirubin, UA: NEGATIVE
Glucose, UA: NEGATIVE
Ketones, UA: NEGATIVE
Leukocytes, UA: NEGATIVE
Nitrite, UA: NEGATIVE
Protein, UA: POSITIVE — AB
Spec Grav, UA: 1.02 (ref 1.010–1.025)
Urobilinogen, UA: 0.2 E.U./dL
pH, UA: 5 (ref 5.0–8.0)

## 2021-11-03 MED ORDER — ALENDRONATE SODIUM 70 MG PO TABS: ORAL_TABLET | ORAL | 1 refills | Status: DC

## 2021-11-03 NOTE — Assessment & Plan Note (Signed)
Patient had imaging done in October 2019.  Patient is overdue for repeat imaging.  MRI ordered.

## 2021-11-03 NOTE — Assessment & Plan Note (Signed)
She is not currently on a statin.  Patient is not wanting to take any medications on top of what she is already taking at this time.  Her last LDL was 130 on 10/28/2020.  We will be getting labs today.

## 2021-11-03 NOTE — Progress Notes (Addendum)
BP 136/84   Pulse 98   Temp 98.7 F (37.1 C)   Resp 14   Ht '5\' 9"'$  (1.753 m)   Wt 225 lb 8 oz (102.3 kg)   SpO2 96%   BMI 33.30 kg/m    Subjective:    [name], female    DOB: 1945-12-18, 76 y.o.   MRN: [id number]  HPI: Rebecca Anthony is a 76 y.o. female  Chief Complaint  Patient presents with   Follow-up   Hypertension   Hyperlipidemia   Hypothyroidism     Hypertension: Her blood pressure today is 134/92.  Recheck was 136/84.    She says she does get a little bit of whitecoat syndrome when she comes to the doctor.  He also reports recently she has been having a lot of problems with her back.  She is seeing a pain specialist and she thinks things are starting to get better.  She denies any chest pain, shortness of breath, headaches or blurred vision.  She is currently taking metoprolol 50 mg daily.    Aortic atherosclerosis/hyperlipidemia: Her last LDL was 130 on 10/28/2020.  She is prescribed rosuvastatin but she never started taking it.  Patient tries to control her cholesterol with her diet.  Patient is not interested in taking a statin at this time.     Atrial fibrillation: Her atrial fibrillation is monitored by her cardiologist Dr. Rockey Situ.  She last saw him on 07/22/2021.  She is currently taking Eliquis 5 mg twice a day.  She also takes metoprolol 50 mg daily.  She denies any chest pain or shortness of breath.    Hypothyroidism: She is currently taking levothyroxine 50 mcg daily.  She denies any weight loss, changes in her hair skin or any fatigue.  She says she feels pretty good.  She does occasionally have constipation.  We have discussed this before about increasing fiber in her diet and drinking plenty of fluids.  Her last TSH was 0.96 on 10/28/2020.  We will get labs today.  Osteoporosis: She is up-to-date on her bone density.  She continues to take vitamin D and calcium supplementation.  She is also taking alendronate 70 mg weekly.  Pancreatic cyst:  Patient reports that several years ago she had an MRI of her abdomen and she was told that she had a pancreatic cyst that would have to be monitored.  MRI was done on January 15, 2018.IMPRESSION: 1. No acute findings and no explanation for patient's abdominal pain. 2. Suspect small gallbladder polyp. 3. 5 mm cystic lesion within head of pancreas is identified. Followup exam in 24 months with repeat contrast enhanced MRI of the abdomen is advised. This recommendation follows ACR consensus guidelines: Management of Incidental Pancreatic Cysts: A White Paper of the ACR Incidental Findings Committee. [place] 5809;98:338-250. Due to recommendations of having a repeat contrast MRI orders placed.  Abnormal urine odor: Patient reports recently she has noticed an odor to her urine.  Is any urinary frequency, urgency or dysuria.  Patient also denies any fever.  We will get a urine sample do a dip and send for culture.  Chronic lumbar back pain with sciatica/chronic pain syndrome: She reports that she is seeing pain management.  Dr. Erma Pinto recently did injections in her back.  She feels like it might be getting better.  Relevant past medical, surgical, family and social history reviewed and updated as indicated. Interim medical history since our last visit reviewed. Allergies  and medications reviewed and updated.  Review of Systems Constitutional: Negative for fever or weight change.  Respiratory: Negative for cough and shortness of breath.   Cardiovascular: Negative for chest pain or palpitations.  Gastrointestinal: Negative for abdominal pain, no bowel changes.  Musculoskeletal: Negative for gait problem or joint swelling.  Positive for lumbar back pain Skin: Negative for rash.  Neurological: Negative for dizziness or headache.  No other specific complaints in a complete review of systems (except as listed in HPI above).      Objective:    BP 136/84   Pulse 98   Temp 98.7 F (37.1 C)    Resp 14   Ht '5\' 9"'$  (1.753 m)   Wt 225 lb 8 oz (102.3 kg)   SpO2 96%   BMI 33.30 kg/m   Wt Readings from Last 3 Encounters:  11/03/21 225 lb 8 oz (102.3 kg)  05/17/21 221 lb 8 oz (100.5 kg)  05/05/21 226 lb 11.2 oz (102.8 kg)    Physical Exam Constitutional: Patient appears well-developed and well-nourished. Obese  No distress.  HEENT: head atraumatic, normocephalic, pupils equal and reactive to light, neck supple Cardiovascular: Normal rate, irregular rhythm and normal heart sounds.  murmur heard. No BLE edema. Pulmonary/Chest: Effort normal and breath sounds normal. No respiratory distress. Abdominal: Soft.  There is no tenderness.  No CVA tenderness Psychiatric: Patient has a normal mood and affect. behavior is normal. Judgment and thought content normal. Results for orders placed or performed in visit on 11/03/21  POCT urinalysis dipstick  Result Value Ref Range   Color, UA yellow    Clarity, UA cloudy    Glucose, UA Negative Negative   Bilirubin, UA negative    Ketones, UA negative    Spec Grav, UA 1.020 1.010 - 1.025   Blood, UA large    pH, UA 5.0 5.0 - 8.0   Protein, UA Positive (A) Negative   Urobilinogen, UA 0.2 0.2 or 1.0 E.U./dL   Nitrite, UA negative    Leukocytes, UA Negative Negative   Appearance cloudy    Odor none       Assessment & Plan:   Problem List Items Addressed This Visit       Cardiovascular and Mediastinum   PAF (paroxysmal atrial fibrillation) (HCC) (Chronic)    Patient reports she has been doing pretty well.  She is followed by Dr. Rockey Situ cardiology.  Last saw on 07/22/2021.  She is currently taking Eliquis 5 mg twice a day and metoprolol 50 mg daily.      Essential hypertension    Continue taking metoprolol 50 mg daily.  Continue monitoring blood pressure at home.      Relevant Orders   CBC with Differential/Platelet   COMPLETE METABOLIC PANEL WITH GFR   Chronic atrial fibrillation [facility])    Patient reports she has been doing pretty  well.  She is followed by Dr. Rockey Situ cardiology.  Last saw on 07/22/2021.  She is currently taking Eliquis 5 mg twice a day and metoprolol 50 mg daily.      Aortic atherosclerosis ([place])    She is not currently on a statin.  Patient is not wanting to take any medications on top of what she is already taking at this time.  Her last LDL was 130 on 10/28/2020.  We will be getting labs today.        Digestive   Cyst of pancreas    Patient had imaging done in October 2019.  Patient  is overdue for repeat imaging.  MRI ordered.      Relevant Orders   MR Abdomen W Wo Contrast     Endocrine   Hypothyroidism (Chronic)    She is currently taking levothyroxine 50 mcg daily.  She is due for labs we will get labs today.      Relevant Orders   TSH     Nervous and Auditory   Chronic sciatica of left side    Continue taking Robaxin 750 mg 2 times a day as needed, gabapentin 300 mg at bedtime and continue to follow with pain clinic.        Musculoskeletal and Integument   Localized osteoporosis without current pathological fracture    Patient continues to take her vitamin D and calcium supplementation and she is also taking her Fosamax 70 mg weekly.      Relevant Medications   alendronate (FOSAMAX) 70 MG tablet     Other   Chronic pain syndrome (Chronic)    Continue taking Robaxin 750 mg 2 times a day as needed, gabapentin 300 mg at bedtime and continue to follow with pain clinic.      Dyslipidemia - Primary    She is not currently on a statin.  Patient is not wanting to take any medications on top of what she is already taking at this time.  Her last LDL was 130 on 10/28/2020.  We will be getting labs today.      Relevant Orders   Lipid panel   Other Visit Diagnoses     Abnormal urine odor       We will get urine dip and urine culture.   Relevant Orders   POCT urinalysis dipstick (Completed)   Urine Culture        Follow up plan: Return in about 6 months (around 05/06/2022) for  follow up.

## 2021-11-03 NOTE — Assessment & Plan Note (Signed)
Patient reports she has been doing pretty well.  She is followed by Dr. Rockey Situ cardiology.  Last saw on 07/22/2021.  She is currently taking Eliquis 5 mg twice a day and metoprolol 50 mg daily.

## 2021-11-03 NOTE — Assessment & Plan Note (Signed)
Continue taking Robaxin 750 mg 2 times a day as needed, gabapentin 300 mg at bedtime and continue to follow with pain clinic.

## 2021-11-03 NOTE — Assessment & Plan Note (Signed)
Continue taking metoprolol 50 mg daily.  Continue monitoring blood pressure at home.

## 2021-11-03 NOTE — Assessment & Plan Note (Signed)
Patient continues to take her vitamin D and calcium supplementation and she is also taking her Fosamax 70 mg weekly.

## 2021-11-03 NOTE — Assessment & Plan Note (Signed)
She is currently taking levothyroxine 50 mcg daily.  She is due for labs we will get labs today.

## 2021-11-04 LAB — URINE CULTURE
MICRO NUMBER:: 13673908
Result:: NO GROWTH
SPECIMEN QUALITY:: ADEQUATE

## 2021-11-04 LAB — CBC WITH DIFFERENTIAL/PLATELET
Absolute Monocytes: 832 cells/uL (ref 200–950)
Basophils Absolute: 30 cells/uL (ref 0–200)
Basophils Relative: 0.5 %
Eosinophils Absolute: 100 cells/uL (ref 15–500)
Eosinophils Relative: 1.7 %
HCT: 42.7 % (ref 35.0–45.0)
Hemoglobin: 14 g/dL (ref 11.7–15.5)
Lymphs Abs: 1540 cells/uL (ref 850–3900)
MCH: 28.6 pg (ref 27.0–33.0)
MCHC: 32.8 g/dL (ref 32.0–36.0)
MCV: 87.1 fL (ref 80.0–100.0)
MPV: 9.4 fL (ref 7.5–12.5)
Monocytes Relative: 14.1 %
Neutro Abs: 3398 cells/uL (ref 1500–7800)
Neutrophils Relative %: 57.6 %
Platelets: 290 10*3/uL (ref 140–400)
RBC: 4.9 10*6/uL (ref 3.80–5.10)
RDW: 12.4 % (ref 11.0–15.0)
Total Lymphocyte: 26.1 %
WBC: 5.9 10*3/uL (ref 3.8–10.8)

## 2021-11-04 LAB — COMPLETE METABOLIC PANEL WITH GFR
AG Ratio: 1.6 (calc) (ref 1.0–2.5)
ALT: 12 U/L (ref 6–29)
AST: 12 U/L (ref 10–35)
Albumin: 4.4 g/dL (ref 3.6–5.1)
Alkaline phosphatase (APISO): 68 U/L (ref 37–153)
BUN: 20 mg/dL (ref 7–25)
CO2: 27 mmol/L (ref 20–32)
Calcium: 10 mg/dL (ref 8.6–10.4)
Chloride: 104 mmol/L (ref 98–110)
Creat: 0.87 mg/dL (ref 0.60–1.00)
Globulin: 2.7 g/dL (calc) (ref 1.9–3.7)
Glucose, Bld: 91 mg/dL (ref 65–99)
Potassium: 4.8 mmol/L (ref 3.5–5.3)
Sodium: 140 mmol/L (ref 135–146)
Total Bilirubin: 0.5 mg/dL (ref 0.2–1.2)
Total Protein: 7.1 g/dL (ref 6.1–8.1)
eGFR: 69 mL/min/{1.73_m2} (ref >=60)

## 2021-11-04 LAB — LIPID PANEL
Cholesterol: 231 mg/dL — ABNORMAL HIGH (ref <200)
HDL: 71 mg/dL (ref >=50)
LDL Cholesterol (Calc): 139 mg/dL (calc) — ABNORMAL HIGH
Non-HDL Cholesterol (Calc): 160 mg/dL (calc) — ABNORMAL HIGH (ref <130)
Total CHOL/HDL Ratio: 3.3 (calc) (ref <5.0)
Triglycerides: 101 mg/dL (ref <150)

## 2021-11-04 LAB — TSH: TSH: 1.61 mIU/L (ref 0.40–4.50)

## 2021-11-04 NOTE — Telephone Encounter (Signed)
Requested Prescriptions  Pending Prescriptions Disp Refills  . alendronate (FOSAMAX) 70 MG tablet [Pharmacy Med Name: ALENDRONATE SODIUM 70 MG TAB] 12 tablet 1    Sig: TAKE 1 TABLET (70 MG TOTAL) BY MOUTH EVERY 7 DAYS WITH FULL GLASS WATER ON EMPTY STOMACH     Endocrinology:  Bisphosphonates Failed - 11/03/2021  3:37 PM      Failed - Ca in normal range and within 360 days    Calcium  Date Value Ref Range Status  11/03/2021 10.0 8.6 - 10.4 mg/dL Final         Failed - Vitamin D in normal range and within 360 days    Vit D, 25-Hydroxy  Date Value Ref Range Status  05/17/2017 37.4 30.0 - 100.0 ng/mL Final    Comment:    Vitamin D deficiency has been defined by the [place] practice guideline as a level of serum 25-OH vitamin D less than 20 ng/mL (1,2). The Endocrine Society went on to further define vitamin D insufficiency as a level between 21 and 29 ng/mL (2). 1. IOM (Institute of Medicine). 2010. Dietary reference    intakes for calcium and D. [place]: The    [employer]. 2. Holick MF, Binkley McDonald, Bischoff-Ferrari HA, et al.    Evaluation, treatment, and prevention of vitamin D    deficiency: an Endocrine Society clinical practice    guideline. JCEM. 2011 Jul; 96(7):1911-30.   05/17/2017 33.0 30.0 - 100.0 ng/mL Final    Comment:    Vitamin D deficiency has been defined by the [place] practice guideline as a level of serum 25-OH vitamin D less than 20 ng/mL (1,2). The Endocrine Society went on to further define vitamin D insufficiency as a level between 21 and 29 ng/mL (2). 1. IOM (Institute of Medicine). 2010. Dietary reference    intakes for calcium and D. [place]: The    [employer]. 2. Holick MF, Binkley Rosebud, Bischoff-Ferrari HA, et al.    Evaluation, treatment, and prevention of vitamin D    deficiency: an Endocrine Society clinical practice    guideline.  JCEM. 2011 Jul; 96(7):1911-30.          Failed - Cr in normal range and within 360 days    Creat  Date Value Ref Range Status  11/03/2021 0.87 0.60 - 1.00 mg/dL Final         Failed - Mg Level in normal range and within 360 days    Magnesium  Date Value Ref Range Status  10/31/2017 2.2 1.6 - 2.3 mg/dL Final         Failed - Phosphate in normal range and within 360 days    No results found for: "PHOS"       Failed - eGFR is 30 or above and within 360 days    GFR, Est African American  Date Value Ref Range Status  03/19/2020 71 > OR = 60 mL/min/1.23m Final   GFR, Est Non African American  Date Value Ref Range Status  03/19/2020 61 > OR = 60 mL/min/1.760mFinal   eGFR  Date Value Ref Range Status  11/03/2021 69 > OR = 60 mL/min/1.7372minal    Comment:    The eGFR is based on the CKD-EPI 2021 equation. To calculate  the new eGFR from a previous Creatinine or Cystatin C result, go to https://www.kidney.org/professionals/ kdoqi/gfr%5Fcalculator  Passed - Valid encounter within last 12 months    Recent Outpatient Visits          Yesterday Dyslipidemia   [facility] Bo Merino, FNP   5 months ago Viral upper respiratory tract infection   [place], FNP   6 months ago Dyslipidemia   [place], FNP   1 year ago Dyslipidemia   [facility] Delsa Grana, PA-C   1 year ago PAF (paroxysmal atrial fibrillation) [facility])   [facility] Delsa Grana, PA-C      Future Appointments            In 6 months Reece Packer, Myna Hidalgo, [facility], [place] Density or Dexa Scan completed in the last 2 years

## 2021-11-08 ENCOUNTER — Ambulatory Visit: Payer: Self-pay

## 2021-11-08 ENCOUNTER — Encounter: Payer: Self-pay | Admitting: Nurse Practitioner

## 2021-11-08 NOTE — Patient Outreach (Signed)
  Care Coordination   Initial Visit Note   11/08/2021 Name: Rebecca Anthony MRN: [id number] DOB: January 17, 1946  Rebecca Anthony is a 76 y.o. year old female who sees Rebecca Salines, FNP for primary care. I spoke with  Rebecca Anthony by phone today    SDOH assessments and interventions completed:   Yes SDOH Interventions Today    Flowsheet Row Most Recent Value  SDOH Interventions   Food Insecurity Interventions Intervention Not Indicated  Transportation Interventions Intervention Not Indicated       Care Coordination Interventions Activated:  Yes Care Coordination Interventions:  Yes, provided  Follow up plan:  Rebecca Anthony will call or contact the clinic if additional assistance is required.  Encounter Outcome:   Patient visit complete   Rebecca Anthony /Care Management [phone]

## 2021-11-15 ENCOUNTER — Encounter: Payer: Self-pay | Admitting: Nurse Practitioner

## 2021-11-18 ENCOUNTER — Ambulatory Visit: Payer: Medicare Other

## 2021-11-29 ENCOUNTER — Ambulatory Visit
Admission: RE | Admit: 2021-11-29 | Discharge: 2021-11-29 | Disposition: A | Discharge status: Home / Self Care | Payer: Medicare Other | Source: Ambulatory Visit | Attending: Nurse Practitioner | Admitting: Nurse Practitioner

## 2021-11-29 DIAGNOSIS — K862 Cyst of pancreas: Secondary | ICD-10-CM | POA: Diagnosis present

## 2021-11-29 MED ORDER — GADOBUTROL 1 MMOL/ML IV SOLN
10.0000 mL | Freq: Once | INTRAVENOUS | Status: AC | PRN
  Administered 2021-11-29: 10 mL via INTRAVENOUS

## 2021-12-12 ENCOUNTER — Telehealth: Payer: Self-pay | Admitting: Podiatry

## 2021-12-12 ENCOUNTER — Ambulatory Visit: Payer: Self-pay | Admitting: *Deleted

## 2021-12-12 NOTE — Telephone Encounter (Signed)
Summary: MRI results, stomach problems   Pt called wanting to discuss her MRI, still has stomach problems she says. Wants to know if it showed anything else in addition to what she has seen via mychart.   Best contact: [phone]      Chief Complaint: Abdominal Pain Symptoms: Lower right sided now. At times left sided, comes and goes,4/10 several months, tender with palpation. Frequency: several months Pertinent Negatives: Patient denies fever, diarrhea, nausea Disposition: '[]'$ ED /'[]'$ Urgent Care (no appt availability in office) / '[]'$ Appointment(In office/virtual)/ '[]'$  [place] [facility]/ '[]'$ Home Care/ '[]'$ Refused Recommended Disposition /'[]'$ [place] [facility]/ '[x]'$  Follow-up with PCP Additional Notes: Pt initially calling to question what all MRI would show of stomach. States she should have mentioned this lower abdominal pain. Assured pt NT would route to practice for PCPs review and final disposition. Declines appt at this time, would like Julies input on MRI. Please advise. Thank you.  Reason for Disposition  Abdominal pain is a chronic symptom (recurrent or ongoing AND present > 4 weeks)  Answer Assessment - Initial Assessment Questions 1. LOCATION: "Where does it hurt?"      Side. Left side earlier now mostly  right side at navel 2. RADIATION: "Does the pain shoot anywhere else?" (e.g., chest, back)     No 3. ONSET: "When did the pain begin?" (e.g., minutes, hours or days ago)      Several months 4. SUDDEN: "Gradual or sudden onset?"     gradual 5. PATTERN "Does the pain come and go, or is it constant?"    - If it comes and goes: "How long does it last?" "Do you have pain now?"     (Note: Comes and goes means the pain is intermittent. It goes away completely between bouts.)    - If constant: "Is it getting better, staying the same, or getting worse?"      (Note: Constant means the pain never goes away completely; most serious pain is constant and gets worse.)      Comes and  goes 6. SEVERITY: "How bad is the pain?"  (e.g., Scale 1-10; mild, moderate, or severe)    - MILD (1-3): Doesn't interfere with normal activities, abdomen soft and not tender to touch.     - MODERATE (4-7): Interferes with normal activities or awakens from sleep, abdomen tender to touch.     - SEVERE (8-10): Excruciating pain, doubled over, unable to do any normal activities.       4/10 7. RECURRENT SYMPTOM: "Have you ever had this type of stomach pain before?" If Yes, ask: "When was the last time?" and "What happened that time?"      No 8. CAUSE: "What do you think is causing the stomach pain?"     Unsure 9. RELIEVING/AGGRAVATING FACTORS: "What makes it better or worse?" (e.g., antacids, bending or twisting motion, bowel movement)     Tender when "Mashing on it." 10. OTHER SYMPTOMS: "Do you have any other symptoms?" (e.g., back pain, diarrhea, fever, urination pain, vomiting)      Bloated  Protocols used: Abdominal Pain - Female-A-AH

## 2021-12-12 NOTE — Telephone Encounter (Signed)
Left message for patient to advise that her appt time on 8/30 has changed from 4:15pm to 3:45pm if unable to keep to please call so that we can change the appt day

## 2021-12-13 NOTE — Telephone Encounter (Signed)
Please advise 

## 2021-12-13 NOTE — Telephone Encounter (Signed)
Left message for patient

## 2021-12-14 ENCOUNTER — Ambulatory Visit: Payer: Medicare Other | Admitting: Podiatry

## 2021-12-14 ENCOUNTER — Encounter: Payer: Self-pay | Admitting: Podiatry

## 2021-12-14 DIAGNOSIS — D689 Coagulation defect, unspecified: Secondary | ICD-10-CM

## 2021-12-14 DIAGNOSIS — D2371 Other benign neoplasm of skin of right lower limb, including hip: Secondary | ICD-10-CM | POA: Diagnosis not present

## 2021-12-14 DIAGNOSIS — B351 Tinea unguium: Secondary | ICD-10-CM

## 2021-12-14 DIAGNOSIS — D2372 Other benign neoplasm of skin of left lower limb, including hip: Secondary | ICD-10-CM

## 2021-12-14 DIAGNOSIS — M79676 Pain in unspecified toe(s): Secondary | ICD-10-CM | POA: Diagnosis not present

## 2021-12-14 NOTE — Progress Notes (Signed)
She presents today chief complaint of painful elongated toenails and callus beneath first metatarsophalangeal joint left foot.  Objective: Vital signs stable alert oriented x3 pulses are palpable.  Feet are warm to the touch capillary fill time is immediate she also has benign skin lesion none ulcerative in nature Sub first metatarsal phalangeal joint of the left foot secondary plantarflexed first metatarsal.  She also has painful elongated toenails 1 through 5 bilateral.  Assessment: Pain in limb secondary onychomycosis benign skin lesion.  Plan: Debridement of benign skin lesion with padding and debridement of toenails 1 through 5 bilateral.  Follow-up with her in 3 months

## 2022-01-12 ENCOUNTER — Telehealth: Payer: Self-pay | Admitting: Nurse Practitioner

## 2022-01-12 NOTE — Telephone Encounter (Signed)
Copied from [place]. Topic: General - Other >> Jan 12, 2022  4:32 PM Everette C wrote: Reason for CRM: The patient would like to speak with a member of clinical staff when possible regarding their labs from 11/03/21  Please contact further when possible

## 2022-01-13 NOTE — Telephone Encounter (Signed)
Patient stated she is on a blood thinner (Eliquis) can this be the cause of spots of blood in urine.

## 2022-01-16 NOTE — Telephone Encounter (Signed)
PAtietn did not see any blood, she is going by what was on her poct UA

## 2022-01-20 ENCOUNTER — Other Ambulatory Visit: Payer: Self-pay | Admitting: Podiatry

## 2022-01-20 NOTE — Telephone Encounter (Signed)
Patient called regarding blood seen in UA. PT is waiting for response from [facility]. Pt did not "see" blood in her urine. The UA report said that there was blood in the urine.  Pt is on a blood thinner and wonders if this medication is causing this.  PT also wants to know if anything needs to be done regarding this.   Please return pt's call.

## 2022-01-20 NOTE — Telephone Encounter (Signed)
This encounter was created in error - please disregard.

## 2022-01-23 ENCOUNTER — Telehealth: Payer: Self-pay | Admitting: Podiatry

## 2022-01-23 NOTE — Telephone Encounter (Signed)
Refill sent.

## 2022-01-23 NOTE — Telephone Encounter (Signed)
Patient called LM on after hours that she needs a refill of her Gabapentin at [facility].

## 2022-01-26 NOTE — Telephone Encounter (Signed)
Will discuss and check at next appointment. Left message for patient

## 2022-02-09 DIAGNOSIS — M47816 Spondylosis without myelopathy or radiculopathy, lumbar region: Secondary | ICD-10-CM | POA: Insufficient documentation

## 2022-02-27 ENCOUNTER — Ambulatory Visit: Payer: Medicare Other | Admitting: Nurse Practitioner

## 2022-03-01 ENCOUNTER — Ambulatory Visit: Payer: Medicare Other | Admitting: Nurse Practitioner

## 2022-03-01 ENCOUNTER — Ambulatory Visit
Admission: RE | Admit: 2022-03-01 | Discharge: 2022-03-01 | Disposition: A | Discharge status: Home / Self Care | Payer: Medicare Other | Attending: Nurse Practitioner | Admitting: Nurse Practitioner

## 2022-03-01 ENCOUNTER — Ambulatory Visit
Admission: RE | Admit: 2022-03-01 | Discharge: 2022-03-01 | Disposition: A | Discharge status: Home / Self Care | Payer: Medicare Other | Source: Ambulatory Visit | Attending: Nurse Practitioner | Admitting: Nurse Practitioner

## 2022-03-01 ENCOUNTER — Encounter: Payer: Self-pay | Admitting: Nurse Practitioner

## 2022-03-01 ENCOUNTER — Other Ambulatory Visit: Payer: Self-pay

## 2022-03-01 VITALS — BP 124/82 | HR 81 | Temp 98.3°F | Resp 14 | Ht 69.0 in | Wt 236.1 lb

## 2022-03-01 DIAGNOSIS — R1031 Right lower quadrant pain: Secondary | ICD-10-CM

## 2022-03-01 DIAGNOSIS — R3129 Other microscopic hematuria: Secondary | ICD-10-CM

## 2022-03-01 DIAGNOSIS — R809 Proteinuria, unspecified: Secondary | ICD-10-CM

## 2022-03-01 LAB — POCT URINALYSIS DIPSTICK
Bilirubin, UA: NEGATIVE
Glucose, UA: NEGATIVE
Ketones, UA: NEGATIVE
Leukocytes, UA: NEGATIVE
Nitrite, UA: NEGATIVE
Protein, UA: NEGATIVE
Spec Grav, UA: 1.02 (ref 1.010–1.025)
Urobilinogen, UA: 0.2 E.U./dL
pH, UA: 5 (ref 5.0–8.0)

## 2022-03-01 NOTE — Progress Notes (Addendum)
BP 124/82   Pulse 81   Temp 98.3 F (36.8 C) (Oral)   Resp 14   Ht '5\' 9"'$  (1.753 m)   Wt 236 lb 1.6 oz (107.1 kg)   SpO2 96%   BMI 34.87 kg/m    Subjective:    [name], female    DOB: May 17, 1945, 76 y.o.   MRN: [id number]  HPI: Rebecca Anthony is a 76 y.o. female  Chief Complaint  Patient presents with   Abdominal Pain    right lower quadrant on and off for weeks   Abdominal pain: Patient reports she has had right side pain after having a meal.  She says it is not every time.  She says her right side is tender when she pushes on it.  She says the pain started several months ago.  She says it comes and goes.  She says she feels bloated too.  She denies any nausea, vomiting or fever. She says she does have problems with constipation.  She last saw her GI doctor Dr. Vicente Males on 10/19/2016.  She says her last bowel movement was two days ago.  She denies any urinary symptoms. Will get KUB to rule out constipation, IF negative will order ct scan.    hematuria:  Patient was positive for hematuria and protein on 11/03/2021. Rechecked today and no protein was seen, she still has hematuria. Patient denies seeing any hematuria it is only seen when we check her urine. She denies any pain, frequency or urgency.  She is currently on eliquis.  Will refer to urology for further evaluation. Patient had previously saw urology back in 2019 for same complaint.   Relevant past medical, surgical, family and social history reviewed and updated as indicated. Interim medical history since our last visit reviewed. Allergies and medications reviewed and updated.  Review of Systems  Constitutional: Negative for fever or weight change.  Respiratory: Negative for cough and shortness of breath.   Cardiovascular: Negative for chest pain or palpitations.  Gastrointestinal: positive for abdominal pain, no bowel changes.  Musculoskeletal: Negative for gait problem or joint swelling.  Skin: Negative  for rash.  Neurological: Negative for dizziness or headache.  No other specific complaints in a complete review of systems (except as listed in HPI above).      Objective:    BP 124/82   Pulse 81   Temp 98.3 F (36.8 C) (Oral)   Resp 14   Ht '5\' 9"'$  (1.753 m)   Wt 236 lb 1.6 oz (107.1 kg)   SpO2 96%   BMI 34.87 kg/m   Wt Readings from Last 3 Encounters:  03/01/22 236 lb 1.6 oz (107.1 kg)  11/03/21 225 lb 8 oz (102.3 kg)  05/17/21 221 lb 8 oz (100.5 kg)    Physical Exam  Constitutional: Patient appears well-developed and well-nourished. Obese  No distress.  HEENT: head atraumatic, normocephalic, pupils equal and reactive to light, neck supple Cardiovascular: Normal rate, regular rhythm and normal heart sounds.  No murmur heard. No BLE edema. Pulmonary/Chest: Effort normal and breath sounds normal. No respiratory distress. Abdominal: Soft.  Tenderness in RLQ Psychiatric: Patient has a normal mood and affect. behavior is normal. Judgment and thought content normal.  Results for orders placed or performed in visit on 03/01/22  POCT urinalysis dipstick  Result Value Ref Range   Color, UA yellow    Clarity, UA clear    Glucose, UA Negative Negative   Bilirubin, UA negative  Ketones, UA negative    Spec Grav, UA 1.020 1.010 - 1.025   Blood, UA large    pH, UA 5.0 5.0 - 8.0   Protein, UA Negative Negative   Urobilinogen, UA 0.2 0.2 or 1.0 E.U./dL   Nitrite, UA negative    Leukocytes, UA Negative Negative   Appearance clear    Odor none       Assessment & Plan:   Problem List Items Addressed This Visit       Genitourinary   Hematuria, microscopic    Referral placed to urology      Relevant Orders   Ambulatory referral to Urology   Other Visit Diagnoses     Right lower quadrant abdominal pain    -  Primary   Relevant Orders   CBC with Differential/Platelet   COMPLETE METABOLIC PANEL WITH GFR   DG Abd 1 View   Lipase   Amylase   Proteinuria, unspecified  type       resolved, urine dip showed no protein,hematuria still present   Relevant Orders   POCT urinalysis dipstick (Completed)   Microalbumin / creatinine urine ratio   Ambulatory referral to Urology        Follow up plan: Return if symptoms worsen or fail to improve.

## 2022-03-01 NOTE — Assessment & Plan Note (Signed)
Referral placed to urology.

## 2022-03-01 NOTE — Addendum Note (Signed)
Addended by: Serafina Royals F on: 03/01/2022 04:48 PM   Modules accepted: Orders

## 2022-03-02 LAB — CBC WITH DIFFERENTIAL/PLATELET
Absolute Monocytes: 620 cells/uL (ref 200–950)
Basophils Absolute: 42 cells/uL (ref 0–200)
Basophils Relative: 0.8 %
Eosinophils Absolute: 80 cells/uL (ref 15–500)
Eosinophils Relative: 1.5 %
HCT: 41.9 % (ref 35.0–45.0)
Hemoglobin: 13.9 g/dL (ref 11.7–15.5)
Lymphs Abs: 1352 cells/uL (ref 850–3900)
MCH: 28.2 pg (ref 27.0–33.0)
MCHC: 33.2 g/dL (ref 32.0–36.0)
MCV: 85 fL (ref 80.0–100.0)
MPV: 9.5 fL (ref 7.5–12.5)
Monocytes Relative: 11.7 %
Neutro Abs: 3207 cells/uL (ref 1500–7800)
Neutrophils Relative %: 60.5 %
Platelets: 293 10*3/uL (ref 140–400)
RBC: 4.93 10*6/uL (ref 3.80–5.10)
RDW: 12.2 % (ref 11.0–15.0)
Total Lymphocyte: 25.5 %
WBC: 5.3 10*3/uL (ref 3.8–10.8)

## 2022-03-02 LAB — LIPASE: Lipase: 12 U/L (ref 7–60)

## 2022-03-02 LAB — COMPLETE METABOLIC PANEL WITH GFR
AG Ratio: 1.8 (calc) (ref 1.0–2.5)
ALT: 14 U/L (ref 6–29)
AST: 14 U/L (ref 10–35)
Albumin: 4.4 g/dL (ref 3.6–5.1)
Alkaline phosphatase (APISO): 71 U/L (ref 37–153)
BUN: 22 mg/dL (ref 7–25)
CO2: 28 mmol/L (ref 20–32)
Calcium: 10 mg/dL (ref 8.6–10.4)
Chloride: 104 mmol/L (ref 98–110)
Creat: 0.83 mg/dL (ref 0.60–1.00)
Globulin: 2.5 g/dL (calc) (ref 1.9–3.7)
Glucose, Bld: 110 mg/dL — ABNORMAL HIGH (ref 65–99)
Potassium: 4.3 mmol/L (ref 3.5–5.3)
Sodium: 139 mmol/L (ref 135–146)
Total Bilirubin: 0.5 mg/dL (ref 0.2–1.2)
Total Protein: 6.9 g/dL (ref 6.1–8.1)
eGFR: 72 mL/min/{1.73_m2} (ref >=60)

## 2022-03-02 LAB — TEST AUTHORIZATION

## 2022-03-02 LAB — MICROALBUMIN / CREATININE URINE RATIO
Creatinine, Urine: 102 mg/dL (ref 20–275)
Microalb Creat Ratio: 19 mcg/mg creat (ref <30)
Microalb, Ur: 1.9 mg/dL

## 2022-03-02 LAB — AMYLASE: Amylase: 39 U/L (ref 21–101)

## 2022-03-03 ENCOUNTER — Other Ambulatory Visit: Payer: Self-pay | Admitting: Nurse Practitioner

## 2022-03-03 DIAGNOSIS — R3129 Other microscopic hematuria: Secondary | ICD-10-CM

## 2022-03-06 ENCOUNTER — Telehealth: Payer: Self-pay | Admitting: Nurse Practitioner

## 2022-03-06 NOTE — Telephone Encounter (Unsigned)
Copied from [place] [id number]. Topic: Referral - Status >> Mar 06, 2022 10:34 AM Sabas Sous wrote: Reason for CRM: Pt has questions about her referral, she is requesting a call back from [facility] specifically.   [phone]

## 2022-03-07 ENCOUNTER — Other Ambulatory Visit: Payer: Self-pay | Admitting: Nurse Practitioner

## 2022-03-07 DIAGNOSIS — R3129 Other microscopic hematuria: Secondary | ICD-10-CM

## 2022-03-07 NOTE — Telephone Encounter (Signed)
Patient called and would like to know how long do she take the Fosamax. How often do she take the miralax.

## 2022-03-07 NOTE — Telephone Encounter (Signed)
Called patient no answer.

## 2022-03-07 NOTE — Telephone Encounter (Signed)
Did you put order in the for Urology at [facility]. Do not want [facility].

## 2022-03-07 NOTE — Telephone Encounter (Signed)
Pt returned [facility] call / pt also mentioned that she wanted to speak with helen about a possible covid test/ please advise

## 2022-03-08 ENCOUNTER — Ambulatory Visit (INDEPENDENT_AMBULATORY_CARE_PROVIDER_SITE_OTHER): Payer: Medicare Other | Admitting: Family Medicine

## 2022-03-08 ENCOUNTER — Encounter: Payer: Self-pay | Admitting: Family Medicine

## 2022-03-08 ENCOUNTER — Telehealth: Payer: Self-pay | Admitting: Nurse Practitioner

## 2022-03-08 VITALS — BP 122/78 | HR 103 | Temp 98.5°F | Resp 16 | Ht 69.0 in | Wt 231.1 lb

## 2022-03-08 DIAGNOSIS — J069 Acute upper respiratory infection, unspecified: Secondary | ICD-10-CM

## 2022-03-08 DIAGNOSIS — J209 Acute bronchitis, unspecified: Secondary | ICD-10-CM | POA: Diagnosis not present

## 2022-03-08 DIAGNOSIS — J40 Bronchitis, not specified as acute or chronic: Secondary | ICD-10-CM

## 2022-03-08 MED ORDER — BENZONATATE 100 MG PO CAPS: 100.0000 mg | ORAL_CAPSULE | Freq: Three times a day (TID) | ORAL | 0 refills | Status: DC | PRN

## 2022-03-08 MED ORDER — DEXAMETHASONE SODIUM PHOSPHATE 10 MG/ML IJ SOLN
10.0000 mg | Freq: Once | INTRAMUSCULAR | Status: AC
  Administered 2022-03-08: 10 mg via INTRAMUSCULAR

## 2022-03-08 MED ORDER — ALBUTEROL SULFATE HFA 108 (90 BASE) MCG/ACT IN AERS: 2.0000 | INHALATION_SPRAY | RESPIRATORY_TRACT | 4 refills | Status: DC | PRN

## 2022-03-08 NOTE — Progress Notes (Signed)
[name], female    DOB: July 13, 1945, 76 y.o.   MRN: [id number]  PCP: Bo Merino, FNP  Chief Complaint  Patient presents with   Cough    Onset for 4 days    Subjective:   Rebecca Anthony is a 76 y.o. female, presents to clinic with CC of the following:  HPI  Pt and husband have been sick/coughing this week, husband was sick about 1.5 weeks ago She has deep hacking cough scantly productive sputum started yesterday, no pleuritic cp, coughing worse, a little tightness, no wheeze, no SOB, no fever, chills, sweats With sx having fatigue, feeling hot a few times, sore throat, runny nose   She hasn't tried any over the counter medicine for her symptoms They might of tried NyQuil or DayQuil and she otherwise is taking medicine from pain management that she believes has Tylenol in it so she did not want to take anything else     Patient Active Problem List   Diagnosis Date Noted   Lumbar spondylosis 02/09/2022   Localized osteoporosis without current pathological fracture 11/03/2021   Degeneration of lumbar intervertebral disc 10/06/2021   Degenerative scoliosis 10/06/2021   Spondylosis without myelopathy 10/06/2021   Chronic atrial fibrillation ([place]) 12/04/2019   Aortic atherosclerosis ([place]) 12/04/2019   Chronic sciatica of left side 10/24/2019   Lumbar radiculopathy 08/28/2019   Dyslipidemia 05/30/2018   Prediabetes 05/14/2018   Thyroid nodule 01/28/2018   Gallbladder polyp 01/28/2018   Cyst of pancreas 01/28/2018   Foraminal stenosis of thoracic region 01/28/2018   Idiopathic scoliosis of thoracolumbar spine 11/28/2017   Chronic pain syndrome 10/31/2017   Obesity (BMI 30.0-34.9) 09/13/2017   Hematuria, microscopic 07/16/2017   Hypothyroidism 02/23/2017   Osteopenia of left femoral neck 03/23/2016   PAF (paroxysmal atrial fibrillation) (HCC)    Essential hypertension       Current Outpatient Medications:    alendronate (FOSAMAX) 70 MG tablet,  TAKE 1 TABLET (70 MG TOTAL) BY MOUTH EVERY 7 DAYS WITH FULL GLASS WATER ON EMPTY STOMACH, Disp: 12 tablet, Rfl: 1   apixaban (ELIQUIS) 5 MG TABS tablet, Take 1 tablet (5 mg total) by mouth 2 (two) times daily., Disp: 180 tablet, Rfl: 3   calcium carbonate (OSCAL) 1500 (600 Ca) MG TABS tablet, Take by mouth. , Disp: , Rfl:    gabapentin (NEURONTIN) 300 MG capsule, TAKE 1 CAPSULE BY MOUTH EVERYDAY AT BEDTIME, Disp: 30 capsule, Rfl: 3   HORSE CHESTNUT PO, Take 1 tablet by mouth daily., Disp: , Rfl:    levothyroxine (SYNTHROID) 50 MCG tablet, Take 1 tablet (50 mcg total) by mouth daily before breakfast., Disp: 90 tablet, Rfl: 3   methocarbamol (ROBAXIN) 750 MG tablet, Take 750 mg by mouth 2 (two) times daily as needed., Disp: , Rfl:    metoprolol succinate (TOPROL-XL) 50 MG 24 hr tablet, Take 1 tablet (50 mg total) by mouth daily. Take with or immediately following a meal., Disp: 90 tablet, Rfl: 3   polyethylene glycol powder (GLYCOLAX/MIRALAX) powder, Take 17 g by mouth daily. Dissolve 1 capful in 8 oz of liquid daily PRN (Patient taking differently: Take 17 g by mouth daily as needed (constipation.).), Disp: 3350 g, Rfl: 1   vitamin C (ASCORBIC ACID) 500 MG tablet, Take 500 mg by mouth daily., Disp: , Rfl:    Allergies  Allergen Reactions   Anaprox [Naproxen Sodium] Rash     Social History   Tobacco Use   Smoking status:  Never   Smokeless tobacco: Never   Tobacco comments:    smoking cessation materials not required  Vaping Use   Vaping Use: Never used  Substance Use Topics   Alcohol use: No   Drug use: No      Chart Review Today: I personally reviewed active problem list, medication list, allergies, family history, social history, health maintenance, notes from last encounter, lab results, imaging with the patient/caregiver today.   Review of Systems  Constitutional:  Positive for fatigue.  HENT: Negative.    Eyes: Negative.   Respiratory:  Positive for cough and chest  tightness. Negative for choking, shortness of breath, wheezing and stridor.   Cardiovascular: Negative.  Negative for chest pain, palpitations and leg swelling.  Gastrointestinal: Negative.   Endocrine: Negative.   Genitourinary: Negative.   Musculoskeletal: Negative.   Skin: Negative.   Allergic/Immunologic: Negative.   Neurological: Negative.   Hematological: Negative.   Psychiatric/Behavioral: Negative.    All other systems reviewed and are negative.      Objective:   Vitals:   03/08/22 1405 03/08/22 1415  BP: 122/78   Pulse: (!) 113 (!) 103  Resp: 16   Temp: 98.5 F (36.9 C)   TempSrc: Oral   SpO2: 97%   Weight: 231 lb 1.6 oz (104.8 kg)   Height: 5' 9" (1.753 m)     Body mass index is 34.13 kg/m.  Physical Exam Vitals and nursing note reviewed.  Constitutional:      General: She is not in acute distress.    Appearance: Normal appearance. She is well-developed. She is obese. She is not ill-appearing, toxic-appearing or diaphoretic.  HENT:     Head: Normocephalic and atraumatic.     Right Ear: Tympanic membrane, ear canal and external ear normal. There is no impacted cerumen.     Left Ear: Tympanic membrane, ear canal and external ear normal.     Nose: Congestion and rhinorrhea present.     Mouth/Throat:     Mouth: Mucous membranes are moist.     Pharynx: Oropharynx is clear. Uvula midline. Posterior oropharyngeal erythema present. No oropharyngeal exudate.  Eyes:     General: Lids are normal. No scleral icterus.       Right eye: No discharge.        Left eye: No discharge.     Conjunctiva/sclera: Conjunctivae normal.  Neck:     Trachea: Phonation normal. No tracheal deviation.  Cardiovascular:     Rate and Rhythm: Normal rate and regular rhythm.     Pulses: Normal pulses.          Radial pulses are 2+ on the right side and 2+ on the left side.       Posterior tibial pulses are 2+ on the right side and 2+ on the left side.     Heart sounds: Normal heart  sounds. No murmur heard.    No friction rub. No gallop.  Pulmonary:     Effort: Pulmonary effort is normal. No respiratory distress.     Breath sounds: No stridor. Wheezing (right lower lung base inspiratory and exp wheeze) present. No rhonchi or rales.  Chest:     Chest wall: No tenderness.  Abdominal:     General: Bowel sounds are normal. There is no distension.     Palpations: Abdomen is soft.     Tenderness: There is no abdominal tenderness. There is no guarding or rebound.  Musculoskeletal:        General: No  deformity. Normal range of motion.     Cervical back: Normal range of motion and neck supple.  Lymphadenopathy:     Cervical: No cervical adenopathy.  Skin:    General: Skin is warm and dry.     Capillary Refill: Capillary refill takes less than 2 seconds.     Coloration: Skin is not pale.     Findings: No rash.  Neurological:     Mental Status: She is alert and oriented to person, place, and time.     Motor: No abnormal muscle tone.     Gait: Gait normal.  Psychiatric:        Speech: Speech normal.        Behavior: Behavior normal.      Results for orders placed or performed in visit on 03/01/22  Microalbumin / creatinine urine ratio  Result Value Ref Range   Creatinine, Urine 102 20 - 275 mg/dL   Microalb, Ur 1.9 mg/dL   Microalb Creat Ratio 19 <30 mcg/mg creat  CBC with Differential/Platelet  Result Value Ref Range   WBC 5.3 3.8 - 10.8 Thousand/uL   RBC 4.93 3.80 - 5.10 Million/uL   Hemoglobin 13.9 11.7 - 15.5 g/dL   HCT 41.9 35.0 - 45.0 %   MCV 85.0 80.0 - 100.0 fL   MCH 28.2 27.0 - 33.0 pg   MCHC 33.2 32.0 - 36.0 g/dL   RDW 12.2 11.0 - 15.0 %   Platelets 293 140 - 400 Thousand/uL   MPV 9.5 7.5 - 12.5 fL   Neutro Abs 3,207 1,500 - 7,800 cells/uL   Lymphs Abs 1,352 850 - 3,900 cells/uL   Absolute Monocytes 620 200 - 950 cells/uL   Eosinophils Absolute 80 15 - 500 cells/uL   Basophils Absolute 42 0 - 200 cells/uL   Neutrophils Relative % 60.5 %    Total Lymphocyte 25.5 %   Monocytes Relative 11.7 %   Eosinophils Relative 1.5 %   Basophils Relative 0.8 %  COMPLETE METABOLIC PANEL WITH GFR  Result Value Ref Range   Glucose, Bld 110 (H) 65 - 99 mg/dL   BUN 22 7 - 25 mg/dL   Creat 0.83 0.60 - 1.00 mg/dL   eGFR 72 > OR = 60 mL/min/1.54m   BUN/Creatinine Ratio SEE NOTE: 6 - 22 (calc)   Sodium 139 135 - 146 mmol/L   Potassium 4.3 3.5 - 5.3 mmol/L   Chloride 104 98 - 110 mmol/L   CO2 28 20 - 32 mmol/L   Calcium 10.0 8.6 - 10.4 mg/dL   Total Protein 6.9 6.1 - 8.1 g/dL   Albumin 4.4 3.6 - 5.1 g/dL   Globulin 2.5 1.9 - 3.7 g/dL (calc)   AG Ratio 1.8 1.0 - 2.5 (calc)   Total Bilirubin 0.5 0.2 - 1.2 mg/dL   Alkaline phosphatase (APISO) 71 37 - 153 U/L   AST 14 10 - 35 U/L   ALT 14 6 - 29 U/L  Amylase  Result Value Ref Range   Amylase 39 21 - 101 U/L  Lipase  Result Value Ref Range   Lipase 12 7 - 60 U/L  TEST AUTHORIZATION  Result Value Ref Range   TEST NAME: AMYLASE LIPASE    TEST CODE: [id number]    CLIENT CONTACT: LESIE SMITH    REPORT ALWAYS MESSAGE SIGNATURE    POCT urinalysis dipstick  Result Value Ref Range   Color, UA yellow    Clarity, UA clear    Glucose, UA Negative Negative  Bilirubin, UA negative    Ketones, UA negative    Spec Grav, UA 1.020 1.010 - 1.025   Blood, UA large    pH, UA 5.0 5.0 - 8.0   Protein, UA Negative Negative   Urobilinogen, UA 0.2 0.2 or 1.0 E.U./dL   Nitrite, UA negative    Leukocytes, UA Negative Negative   Appearance clear    Odor none        Assessment & Plan:   1. Acute bronchitis, unspecified organism Patient presents with 3 to 4 days of upper respiratory symptoms including some congestion postnasal drip, scratchy throat and coughing which she believes is gotten deeper she endorses a tight feeling in her chest with coughing fits but she denies any wheeze, pleuritic chest pain, fever, sweats, chills or dyspnea on exertion.  She has a remote history of bronchitis but  does not need inhalers regularly.  She has recently updated her vaccinations including COVID and flu She and her husband are both sick with similar symptoms for the past 4 to 5 days Her main concern is that she is very tired and has been sleeping more than normal.  She has not taken any over-the-counter medications besides NyQuil. She had a few coughing fits in the office but otherwise is well-appearing, no acute distress, no retractions accessory muscle use or tachypnea On exam she had some mild erythema and edema to her nasal mucosa and posterior oropharynx and with deep inspiration she had a inspiratory and expiratory wheeze only noted to the right lower lung field otherwise was clear without wheeze rales or rhonchi I did not feel that she would tolerate a breathing treatment and she would likely have a lot of side effects with oral prednisone burst so we decided to treat her with a shot of dexamethasone here which should give her some anti-inflammatory effect for a few days she can use a albuterol rescue inhaler at home and she was encouraged to use Tessalon Perles and Mucinex and push a lot of fluids and continue to rest at home if she feels she has postnasal drip she may want to add an antihistamine like Claritin or Zyrtec but for this particular patient with her health history I would avoid most of the other combo cold and cough medicines or decongestants over-the-counter She wanted COVID testing however explained that we would not get results for a few days and this would put her out of the window for Paxlovid use if she happened to be positive.  I strongly encouraged her and her husband to go home and test at home and I would call them at the end of the day and if they were positive Mrs. Shorten would qualify for Paxlovid however Mr. Arcilla would not.  - albuterol (VENTOLIN HFA) 108 (90 Base) MCG/ACT inhaler; Inhale 2 puffs into the lungs every 4 (four) hours as needed for wheezing or shortness of  breath.  Dispense: 1 each; Refill: 4 - benzonatate (TESSALON) 100 MG capsule; Take 1 capsule (100 mg total) by mouth 3 (three) times daily as needed for cough.  Dispense: 30 capsule; Refill: 0 - dexamethasone (DECADRON) injection 10 mg OTC mucinex and antihistamine like zyrtec or claritin  2. Viral URI Mild injection and erythema with clear discharge and nasal mucosa, very mildly injected in the back of her throat, afebrile, otherwise well-appearing with intermittent coughing Encouraged her to do a test at home for COVID, she and her husband are recently vaccinated I explained that it may be another  seasonal respiratory virus or be secondary to allergies and the treatment would be the same - Novel Coronavirus, NAA ([facility])    Patient was called by Elisha Headland around 4 PM and then again at 4 30-4 45 and the patient and her husband had arrived home and did have the home COVID test but had not completed them, they were called back again before the end of business day and it still not started the home COVID test     Delsa Grana, PA-C 03/08/22 2:32 PM

## 2022-03-08 NOTE — Telephone Encounter (Signed)
Please make patient Appointments

## 2022-03-08 NOTE — Telephone Encounter (Signed)
Copied from [place] [id number]. Topic: General - Other >> Mar 08, 2022  4:12 PM Everette C wrote: Reason for CRM: The patient has called to share that they haven't been able to get their COVID test yet and will attempt to pick it up and call in results before the end of the day   Please contact further if needed

## 2022-03-08 NOTE — Telephone Encounter (Signed)
Pt is calling back to report that her COVID test was negative. Please advise

## 2022-03-08 NOTE — Telephone Encounter (Signed)
Pt called in requesting a call back from the clinic. She wants to be worked in today for a covid test, has symptoms.  Best contact: [phone]

## 2022-03-09 LAB — NOVEL CORONAVIRUS, NAA: SARS-CoV-2, NAA: NOT DETECTED

## 2022-03-15 ENCOUNTER — Ambulatory Visit: Payer: Medicare Other | Admitting: Podiatry

## 2022-03-20 ENCOUNTER — Ambulatory Visit: Payer: Medicare Other | Admitting: Podiatry

## 2022-03-22 ENCOUNTER — Ambulatory Visit: Payer: Medicare Other | Admitting: Podiatry

## 2022-03-29 ENCOUNTER — Ambulatory Visit: Payer: Medicare Other | Admitting: Podiatry

## 2022-03-29 ENCOUNTER — Encounter: Payer: Self-pay | Admitting: Podiatry

## 2022-03-29 DIAGNOSIS — D689 Coagulation defect, unspecified: Secondary | ICD-10-CM | POA: Diagnosis not present

## 2022-03-29 DIAGNOSIS — B351 Tinea unguium: Secondary | ICD-10-CM | POA: Diagnosis not present

## 2022-03-29 DIAGNOSIS — D2371 Other benign neoplasm of skin of right lower limb, including hip: Secondary | ICD-10-CM | POA: Diagnosis not present

## 2022-03-29 DIAGNOSIS — M79676 Pain in unspecified toe(s): Secondary | ICD-10-CM

## 2022-03-29 DIAGNOSIS — D2372 Other benign neoplasm of skin of left lower limb, including hip: Secondary | ICD-10-CM

## 2022-03-29 NOTE — Progress Notes (Signed)
She presents today chief complaint of painful elongated toenails and calluses bilateral.  Objective: Pulses are palpable.  There is no erythema edema salines drainage or odor.  Toenails are long thick yellow dystrophic onychomycotic.  Assessment: Pain limb secondary to onychomycosis and benign skin lesions.  Plan: Debrided benign skin lesions debrided toenails 1 through 5 bilateral.

## 2022-04-13 ENCOUNTER — Other Ambulatory Visit: Payer: Self-pay | Admitting: Nurse Practitioner

## 2022-04-13 DIAGNOSIS — M816 Localized osteoporosis [Lequesne]: Secondary | ICD-10-CM

## 2022-04-14 ENCOUNTER — Telehealth: Payer: Self-pay | Admitting: Nurse Practitioner

## 2022-04-14 NOTE — Telephone Encounter (Signed)
Copied from [place] [id number]. Topic: General - Other >> Apr 14, 2022  4:26 PM Everette C wrote: Reason for CRM: Medication Refill - Medication: alendronate (FOSAMAX) 70 MG tablet [[id number]]  Has the patient contacted their pharmacy? Yes.  The patient has been directed to contact their PCP  (Agent: If no, request that the patient contact the pharmacy for the refill. If patient does not wish to contact the pharmacy document the reason why and proceed with request.) (Agent: If yes, when and what did the pharmacy advise?)  Preferred Pharmacy (with phone number or street name): [id number] [place] [id number]: [phone]: [phone]: Not open 24 hours   Has the patient been seen for an appointment in the last year OR does the patient have an upcoming appointment? Yes.    Agent: Please be advised that RX refills may take up to 3 business days. We ask that you follow-up with your pharmacy.

## 2022-04-14 NOTE — Telephone Encounter (Signed)
Duplicate request, already routed to provider for approval.

## 2022-04-14 NOTE — Telephone Encounter (Signed)
Requested medication (s) are due for refill today: yes   Requested medication (s) are on the active medication list: yes   Last refill:  11/04/21 #12 1 refill  Future visit scheduled: yes in 3 weeks  Notes to clinic:  protocol failed last labs 10/31/2017. Do you want to refill Rx?     Requested Prescriptions  Pending Prescriptions Disp Refills   alendronate (FOSAMAX) 70 MG tablet [Pharmacy Med Name: ALENDRONATE SODIUM 70 MG TAB] 12 tablet 1    Sig: TAKE 1 TABLET (70 MG TOTAL) BY MOUTH EVERY 7 DAYS WITH FULL GLASS WATER ON EMPTY STOMACH     Endocrinology:  Bisphosphonates Failed - 04/13/2022 12:55 AM      Failed - Vitamin D in normal range and within 360 days    Vit D, 25-Hydroxy  Date Value Ref Range Status  05/17/2017 37.4 30.0 - 100.0 ng/mL Final    Comment:    Vitamin D deficiency has been defined by the Institute of Medicine and an Endocrine Society practice guideline as a level of serum 25-OH vitamin D less than 20 ng/mL (1,2). The Endocrine Society went on to further define vitamin D insufficiency as a level between 21 and 29 ng/mL (2). 1. IOM (Institute of Medicine). 2010. Dietary reference    intakes for calcium and D. [place]: The    [employer]. 2. Holick MF, Binkley [place], Bischoff-Ferrari HA, et al.    Evaluation, treatment, and prevention of vitamin D    deficiency: an Endocrine Society clinical practice    guideline. JCEM. 2011 Jul; 96(7):1911-30.   05/17/2017 33.0 30.0 - 100.0 ng/mL Final    Comment:    Vitamin D deficiency has been defined by the [place] practice guideline as a level of serum 25-OH vitamin D less than 20 ng/mL (1,2). The Endocrine Society went on to further define vitamin D insufficiency as a level between 21 and 29 ng/mL (2). 1. IOM (Institute of Medicine). 2010. Dietary reference    intakes for calcium and D. [place]: The    [employer]. 2. Holick MF, Binkley [place],  Bischoff-Ferrari HA, et al.    Evaluation, treatment, and prevention of vitamin D    deficiency: an Endocrine Society clinical practice    guideline. JCEM. 2011 Jul; 96(7):1911-30.          Failed - Mg Level in normal range and within 360 days    Magnesium  Date Value Ref Range Status  10/31/2017 2.2 1.6 - 2.3 mg/dL Final         Failed - Phosphate in normal range and within 360 days    No results found for: "PHOS"       Passed - Ca in normal range and within 360 days    Calcium  Date Value Ref Range Status  03/01/2022 10.0 8.6 - 10.4 mg/dL Final         Passed - Cr in normal range and within 360 days    Creat  Date Value Ref Range Status  03/01/2022 0.83 0.60 - 1.00 mg/dL Final   Creatinine, Urine  Date Value Ref Range Status  03/01/2022 102 20 - 275 mg/dL Final         Passed - eGFR is 30 or above and within 360 days    GFR, Est African American  Date Value Ref Range Status  03/19/2020 71 > OR = 60 mL/min/1.61m Final   GFR, Est Non African American  Date Value Ref Range Status  03/19/2020 61 > OR = 60 mL/min/1.37m Final   eGFR  Date Value Ref Range Status  03/01/2022 72 > OR = 60 mL/min/1.776mFinal         Passed - Valid encounter within last 12 months    Recent Outpatient Visits           1 month ago Acute bronchitis, unspecified organism   [facility]   1 month ago Right lower quadrant abdominal pain   [facility]   5 months ago [facility]yslipidemia   [facility]   11 months ago Viral upper respiratory tract infection   [facility]   11 months ago [facility]yslipidemia   [facility]       Future Appointments             In 3 weeks [facility] or Dexa Scan completed in  the last 2 years

## 2022-04-19 NOTE — Telephone Encounter (Signed)
Waiting for med almost out.

## 2022-04-20 ENCOUNTER — Other Ambulatory Visit: Payer: Self-pay | Admitting: Emergency Medicine

## 2022-04-20 DIAGNOSIS — M816 Localized osteoporosis [Lequesne]: Secondary | ICD-10-CM

## 2022-04-20 MED ORDER — ALENDRONATE SODIUM 70 MG PO TABS: ORAL_TABLET | ORAL | 1 refills | Status: DC

## 2022-04-20 NOTE — Telephone Encounter (Signed)
Order sent to [facility] for refill 

## 2022-05-01 ENCOUNTER — Encounter: Payer: Self-pay | Admitting: Nurse Practitioner

## 2022-05-10 ENCOUNTER — Ambulatory Visit: Payer: Medicare Other | Admitting: Nurse Practitioner

## 2022-05-18 NOTE — Progress Notes (Deleted)
There were no vitals taken for this visit.   Subjective:    [name], female    DOB: [birth year], 77 y.o.   MRN: [id number]  HPI: Rebecca Anthony is a 77 y.o. female  No chief complaint on file.    Hypertension: Her blood pressure today is 134/92.  Recheck was 136/84.    She says she does get a little bit of whitecoat syndrome when she comes to the doctor.  He also reports recently she has been having a lot of problems with her back.  She is seeing a pain specialist and she thinks things are starting to get better.  She denies any chest pain, shortness of breath, headaches or blurred vision.  She is currently taking metoprolol 50 mg daily.    Aortic atherosclerosis/hyperlipidemia: Her last LDL was 130 on 10/28/2020.  She is prescribed rosuvastatin but she never started taking it.  Patient tries to control her cholesterol with her diet.  Patient is not interested in taking a statin at this time.     Atrial fibrillation: Her atrial fibrillation is monitored by her cardiologist Dr. Rockey Situ.  She last saw him on 07/22/2021.  She is currently taking Eliquis 5 mg twice a day.  She also takes metoprolol 50 mg daily.  She denies any chest pain or shortness of breath.    Hypothyroidism: She is currently taking levothyroxine 50 mcg daily.  She denies any weight loss, changes in her hair skin or any fatigue.  She says she feels pretty good.  She does occasionally have constipation.  We have discussed this before about increasing fiber in her diet and drinking plenty of fluids.  Her last TSH was 0.96 on 10/28/2020.  We will get labs today.  Osteoporosis: She is up-to-date on her bone density.  She continues to take vitamin D and calcium supplementation.  She is also taking alendronate 70 mg weekly.  Pancreatic cyst: Patient reports that several years ago she had an MRI of her abdomen and she was told that she had a pancreatic cyst that would have to be monitored.  MRI was done on January 15, 2018.IMPRESSION: 1. No acute findings and no explanation for patient's abdominal pain. 2. Suspect small gallbladder polyp. 3. 5 mm cystic lesion within head of pancreas is identified. Followup exam in 24 months with repeat contrast enhanced MRI of the abdomen is advised. This recommendation follows ACR consensus guidelines: Management of Incidental Pancreatic Cysts: A White Paper of the ACR Incidental Findings Committee. [place] [id number]. Due to recommendations of having a repeat contrast MRI orders placed.  Abnormal urine odor: Patient reports recently she has noticed an odor to her urine.  Is any urinary frequency, urgency or dysuria.  Patient also denies any fever.  We will get a urine sample do a dip and send for culture.  Chronic lumbar back pain with sciatica/chronic pain syndrome: She reports that she is seeing pain management.  Dr. Erma Pinto recently did injections in her back.  She feels like it might be getting better.  Relevant past medical, surgical, family and social history reviewed and updated as indicated. Interim medical history since our last visit reviewed. Allergies and medications reviewed and updated.  Review of Systems Constitutional: Negative for fever or weight change.  Respiratory: Negative for cough and shortness of breath.   Cardiovascular: Negative for chest pain or palpitations.  Gastrointestinal: Negative for abdominal pain, no bowel changes.  Musculoskeletal: Negative for gait problem  or joint swelling.  Positive for lumbar back pain Skin: Negative for rash.  Neurological: Negative for dizziness or headache.  No other specific complaints in a complete review of systems (except as listed in HPI above).      Objective:    There were no vitals taken for this visit.  Wt Readings from Last 3 Encounters:  03/08/22 231 lb 1.6 oz (104.8 kg)  03/01/22 236 lb 1.6 oz (107.1 kg)  11/03/21 225 lb 8 oz (102.3 kg)    Physical Exam Constitutional:  Patient appears well-developed and well-nourished. Obese  No distress.  HEENT: head atraumatic, normocephalic, pupils equal and reactive to light, neck supple Cardiovascular: Normal rate, irregular rhythm and normal heart sounds.  murmur heard. No BLE edema. Pulmonary/Chest: Effort normal and breath sounds normal. No respiratory distress. Abdominal: Soft.  There is no tenderness.  No CVA tenderness Psychiatric: Patient has a normal mood and affect. behavior is normal. Judgment and thought content normal. Results for orders placed or performed in visit on 03/08/22  Novel Coronavirus, NAA ([facility])   Specimen: Nasopharyngeal(NP) swabs in vial transport medium   Nasopharynge  Resident  Result Value Ref Range   SARS-CoV-2, NAA Not Detected Not Detected      Assessment & Plan:   Problem List Items Addressed This Visit   None    Follow up plan: No follow-ups on file.

## 2022-05-19 ENCOUNTER — Other Ambulatory Visit: Payer: Self-pay | Admitting: Podiatry

## 2022-05-19 ENCOUNTER — Ambulatory Visit: Payer: Medicare Other | Admitting: Nurse Practitioner

## 2022-05-19 ENCOUNTER — Other Ambulatory Visit: Payer: Self-pay | Admitting: Cardiovascular Disease

## 2022-05-19 DIAGNOSIS — I480 Paroxysmal atrial fibrillation: Secondary | ICD-10-CM

## 2022-05-19 NOTE — Telephone Encounter (Signed)
Refill request

## 2022-05-19 NOTE — Telephone Encounter (Signed)
Eliquis '5mg'$  refill request received. Patient is 77 years old, weight-104.8kg, Crea-0.83 on 03/01/2022, Diagnosis-Afib, and last seen by Dr. Rockey Situ on 07/22/2021. Dose is appropriate based on dosing criteria. Will send in refill to requested pharmacy.

## 2022-05-24 NOTE — Progress Notes (Deleted)
There were no vitals taken for this visit.   Subjective:    [name], female    DOB: [birth year], 77 y.o.   MRN: [id number]  HPI: Rebecca Anthony is a 77 y.o. female  No chief complaint on file.    Hypertension: her blood pressure today is ***. She says her blood pressure is always up when she comes to the doctor.  She is currently taking metoprolol 50 mg daily. She denies any shortness of breath, headaches, blurred vision or chest pain.    Aortic atherosclerosis/hyperlipidemia: she is not currently taking anything for her cholesterol and does not want to. Her last LDL was 139 on 11/03/2021.   Atrial fibrillation: she is established with Dr. Rockey Situ cardiology. She last saw him on 07/22/2021. She is currently taking eliquis 5 mg twice a day and metoprolol 50 mg daily. She denies any shortness of breath or chest pain.   Hypothyroidism: her last TSH was 1.61 on 11/03/2021. She is currently taking levothyroxine 50 mcg daily.  She denies any palpitations, weight loss, or changes in her skin or hair. She has also not had any fatigue. She deos occasionally have constipation.  She has been working on increasing her fiber and drinking more fluids.   Osteoporosis: she is coming due for her bone density. She is currently taking vitamin d and calcium supplementation. She has continued to take alendronate 70 mg daily. She denies any side effects. Will order bone density.  Last bone density was 07/06/2020 DualFemur Neck Left 07/06/2020 76.4 Osteopenia -1.8 0.785 g/cm2 -2.4% - DualFemur Neck Left 04/16/2018 74.2 Osteopenia -1.7 0.804 g/cm2 -3.4% - DualFemur Neck Left 02/22/2016 72.1 Osteopenia -1.5 0.832 g/cm2 2.1% - DualFemur Neck Left 07/23/2013 69.5 Osteopenia -1.6 0.815 g/cm2 - -   DualFemur Total Mean 07/06/2020 76.4 Osteopenia -1.3 0.842 g/cm2 -8.8% Yes DualFemur Total Mean 04/16/2018 74.2 Normal -0.7 0.923 g/cm2 2.8% Yes DualFemur Total Mean 02/22/2016 72.1 Normal -0.9 0.898  g/cm2 - -   Left Forearm Radius 33% 07/06/2020 76.4 Osteoporosis -3.1 0.608 g/cm2 -14.5% Yes Left Forearm Radius 33% 04/16/2018 74.2 Osteopenia -1.9 0.711 g/cm2 -5.3% Yes Left Forearm Radius 33% 02/22/2016 72.1 Osteopenia -1.4 0.751 g/cm2 9.3% Yes Left Forearm Radius 33% 07/23/2013 69.5 Osteopenia -2.2 0.687 g/cm2  Pancreatic cyst: Patient reports that several years ago she had an MRI of her abdomen and she was told that she had a pancreatic cyst that would have to be monitored.  MRI was done on January 15, 2018.IMPRESSION: 1. No acute findings and no explanation for patient's abdominal pain. 2. Suspect small gallbladder polyp. 3. 5 mm cystic lesion within head of pancreas is identified. Followup exam in 24 months with repeat contrast enhanced MRI of the abdomen is advised. This recommendation follows ACR consensus guidelines: Management of Incidental Pancreatic Cysts: A White Paper of the ACR Incidental Findings Committee. [place] 0300;92:330-076. Due to recommendations of having a repeat contrast MRI orders placed. This was completed on 11/29/2021. 1. Stable 4 mm cystic lesion in the head of the pancreas which does not demonstrate suspicious MRI features and requires no additional imaging follow-up. This recommendation follows ACR consensus guidelines: Management of Incidental Pancreatic Cysts: A White Paper of the ACR Incidental Findings Committee. [place] 2263;33:545-625. 2. No acute findings in the abdomen. No need to repeat imaging.    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 Erma Pinto for pain management. She says she does  get injections in her back and that they are helpful.   Relevant past medical, surgical, family and social history reviewed and updated as indicated. Interim medical history since our last visit reviewed. Allergies and medications reviewed and updated.  Review of Systems Constitutional:  Negative for fever or weight change.  Respiratory: Negative for cough and shortness of breath.   Cardiovascular: Negative for chest pain or palpitations.  Gastrointestinal: Negative for abdominal pain, no bowel changes.  Musculoskeletal: Negative for gait problem or joint swelling.  Positive for lumbar back pain Skin: Negative for rash.  Neurological: Negative for dizziness or headache.  No other specific complaints in a complete review of systems (except as listed in HPI above).      Objective:    There were no vitals taken for this visit.  Wt Readings from Last 3 Encounters:  03/08/22 231 lb 1.6 oz (104.8 kg)  03/01/22 236 lb 1.6 oz (107.1 kg)  11/03/21 225 lb 8 oz (102.3 kg)    Physical Exam Constitutional: Patient appears well-developed and well-nourished. Obese  No distress.  HEENT: head atraumatic, normocephalic, pupils equal and reactive to light, neck supple Cardiovascular: Normal rate, irregular rhythm and normal heart sounds.  murmur heard. No BLE edema. Pulmonary/Chest: Effort normal and breath sounds normal. No respiratory distress. Abdominal: Soft.  There is no tenderness.  No CVA tenderness Psychiatric: Patient has a normal mood and affect. behavior is normal. Judgment and thought content normal. Results for orders placed or performed in visit on 03/08/22  Novel Coronavirus, NAA ([facility])   Specimen: Nasopharyngeal(NP) swabs in vial transport medium   Nasopharynge  Resident  Result Value Ref Range   SARS-CoV-2, NAA Not Detected Not Detected      Assessment & Plan:   Problem List Items Addressed This Visit   None    Follow up plan: No follow-ups on file.

## 2022-05-25 ENCOUNTER — Ambulatory Visit: Payer: Medicare Other | Admitting: Nurse Practitioner

## 2022-05-25 DIAGNOSIS — K862 Cyst of pancreas: Secondary | ICD-10-CM

## 2022-05-25 DIAGNOSIS — I700 Atherosclerosis of aorta: Secondary | ICD-10-CM

## 2022-05-25 DIAGNOSIS — G894 Chronic pain syndrome: Secondary | ICD-10-CM

## 2022-05-25 DIAGNOSIS — E785 Hyperlipidemia, unspecified: Secondary | ICD-10-CM

## 2022-05-25 DIAGNOSIS — I10 Essential (primary) hypertension: Secondary | ICD-10-CM

## 2022-05-25 DIAGNOSIS — I482 Chronic atrial fibrillation, unspecified: Secondary | ICD-10-CM

## 2022-05-25 DIAGNOSIS — E039 Hypothyroidism, unspecified: Secondary | ICD-10-CM

## 2022-05-25 DIAGNOSIS — M816 Localized osteoporosis [Lequesne]: Secondary | ICD-10-CM

## 2022-05-27 ENCOUNTER — Other Ambulatory Visit: Payer: Self-pay | Admitting: Nurse Practitioner

## 2022-05-27 DIAGNOSIS — I10 Essential (primary) hypertension: Secondary | ICD-10-CM

## 2022-05-27 DIAGNOSIS — I480 Paroxysmal atrial fibrillation: Secondary | ICD-10-CM

## 2022-05-29 NOTE — Telephone Encounter (Signed)
Requested Prescriptions  Pending Prescriptions Disp Refills   metoprolol succinate (TOPROL-XL) 50 MG 24 hr tablet [Pharmacy Med Name: METOPROLOL SUCC ER 50 MG TAB] 90 tablet 0    Sig: TAKE 1 TABLET BY MOUTH DAILY. TAKE WITH OR IMMEDIATELY FOLLOWING A MEAL.     Cardiovascular:  Beta Blockers Passed - 05/27/2022  7:48 AM      Passed - Last BP in normal range    BP Readings from Last 1 Encounters:  03/08/22 122/78         Passed - Last Heart Rate in normal range    Pulse Readings from Last 1 Encounters:  03/08/22 (!) 103         Passed - Valid encounter within last 6 months    Recent Outpatient Visits           2 months ago Acute bronchitis, unspecified organism   [facility] [place], Kristeen Miss, PA-C   2 months ago Right lower quadrant abdominal pain   [place], FNP   6 months ago Dyslipidemia   [facility] Bo Merino, FNP   1 year ago Viral upper respiratory tract infection   [place], Julie F, FNP   1 year ago Dyslipidemia   [facility] Bo Merino, FNP       Future Appointments             In 1 week Reece Packer, Myna Hidalgo, [facility], [facility]

## 2022-06-01 NOTE — Progress Notes (Signed)
BP 126/82   Pulse 81   Temp 98.3 F (36.8 C) (Oral)   Resp 16   Ht 5' 9"$  (1.753 m)   Wt 237 lb 4.8 oz (107.6 kg)   SpO2 97%   BMI 35.04 kg/m    Subjective:    [name], female    DOB: November 19, 1945, 77 y.o.   MRN: [id number]  HPI: Rebecca Anthony is a 77 y.o. female  Chief Complaint  Patient presents with   Abdominal Pain    Bloated, tenderness   Hypothyroidism   Hypertension   Nevus    On clavicle right side     Hypertension: her blood pressure today is 126/82. She says her blood pressure is always up when she comes to the doctor.  She is currently taking metoprolol 50 mg daily. She denies any shortness of breath, headaches, blurred vision or chest pain.    Aortic atherosclerosis/hyperlipidemia: she is not currently taking anything for her cholesterol and does not want to. Her last LDL was 139 on 11/03/2021.   Atrial fibrillation: she is established with Dr. Rockey Situ cardiology. She last saw him on 07/22/2021. She is currently taking eliquis 5 mg twice a day and metoprolol 50 mg daily. She denies any shortness of breath or chest pain.   Hypothyroidism: her last TSH was 1.61 on 11/03/2021. She is currently taking levothyroxine 50 mcg daily.  She denies any palpitations, weight loss, or changes in her skin or hair. She has also not had any fatigue. She does occasionally have constipation.  She has been working on increasing her fiber and drinking more fluids.   Osteoporosis: she is coming due for her bone density. She is currently taking vitamin d and calcium supplementation. She has continued to take alendronate 70 mg daily. She denies any side effects. She says she has been on fosamax for many years. It looks like she has been on it for over five years. Will stop fosamax. Will order bone density. Discussed if worsening bone density will send to endocrinology.  Last bone density was 07/06/2020 DualFemur Neck Left 07/06/2020 76.4 Osteopenia -1.8 0.785 g/cm2 -2.4%  - DualFemur Neck Left 04/16/2018 74.2 Osteopenia -1.7 0.804 g/cm2 -3.4% - DualFemur Neck Left 02/22/2016 72.1 Osteopenia -1.5 0.832 g/cm2 2.1% - DualFemur Neck Left 07/23/2013 69.5 Osteopenia -1.6 0.815 g/cm2 - -   DualFemur Total Mean 07/06/2020 76.4 Osteopenia -1.3 0.842 g/cm2 -8.8% Yes DualFemur Total Mean 04/16/2018 74.2 Normal -0.7 0.923 g/cm2 2.8% Yes DualFemur Total Mean 02/22/2016 72.1 Normal -0.9 0.898 g/cm2 - -   Left Forearm Radius 33% 07/06/2020 76.4 Osteoporosis -3.1 0.608 g/cm2 -14.5% Yes Left Forearm Radius 33% 04/16/2018 74.2 Osteopenia -1.9 0.711 g/cm2 -5.3% Yes Left Forearm Radius 33% 02/22/2016 72.1 Osteopenia -1.4 0.751 g/cm2 9.3% Yes Left Forearm Radius 33% 07/23/2013 69.5 Osteopenia -2.2 0.687 g/cm2  Pancreatic cyst: Patient reports that several years ago she had an MRI of her abdomen and she was told that she had a pancreatic cyst that would have to be monitored.  MRI was done on January 15, 2018.IMPRESSION: 1. No acute findings and no explanation for patient's abdominal pain. 2. Suspect small gallbladder polyp. 3. 5 mm cystic lesion within head of pancreas is identified. Followup exam in 24 months with repeat contrast enhanced MRI of the abdomen is advised. This recommendation follows ACR consensus guidelines: Management of Incidental Pancreatic Cysts: A White Paper of the ACR Incidental Findings Committee. [place] [id number]. Due to recommendations of having a repeat  contrast MRI orders placed. This was completed on 11/29/2021. 1. Stable 4 mm cystic lesion in the head of the pancreas which does not demonstrate suspicious MRI features and requires no additional imaging follow-up. This recommendation follows ACR consensus guidelines: Management of Incidental Pancreatic Cysts: A White Paper of the ACR Incidental Findings Committee. [place] [id number]. 2. No acute findings in the abdomen. No need to repeat imaging.   Persistent  abdominal pain: she says she has had this off and on tenderness. She has had this pain off and on for years.she says that she continues to have bloating.   She does have an appointment with urology in March due to hematuria.  She is going to see what he says. May need to refer to GI.  She will let me know how urology appointment goes.    Skin lesion:  she has a skin lesion on the right side of her neck.  She says it has been there for awhile but has really noticed in the last two weeks. Recommend seeing dermatologist.  Will place referral.   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  She is currently taking tylenol arthritis 650 mg three times a day.  She is also on robaxin and gabapentin 300 mg at night. She is going to start taking swimming classes.   Relevant past medical, surgical, family and social history reviewed and updated as indicated. Interim medical history since our last visit reviewed. Allergies and medications reviewed and updated.  Review of Systems Constitutional: Negative for fever or weight change.  Respiratory: Negative for cough and shortness of breath.   Cardiovascular: Negative for chest pain or palpitations.  Gastrointestinal: Negative for abdominal pain, no bowel changes.  Musculoskeletal: Negative for gait problem or joint swelling.  Positive for lumbar back pain Skin: Negative for rash.  Neurological: Negative for dizziness or headache.  No other specific complaints in a complete review of systems (except as listed in HPI above).      Objective:    BP 126/82   Pulse 81   Temp 98.3 F (36.8 C) (Oral)   Resp 16   Ht 5' 9"$  (1.753 m)   Wt 237 lb 4.8 oz (107.6 kg)   SpO2 97%   BMI 35.04 kg/m   Wt Readings from Last 3 Encounters:  06/05/22 237 lb 4.8 oz (107.6 kg)  03/08/22 231 lb 1.6 oz (104.8 kg)  03/01/22 236 lb 1.6 oz (107.1 kg)    Physical Exam Constitutional:  Patient appears well-developed and well-nourished. Obese  No distress.  HEENT: head atraumatic, normocephalic, pupils equal and reactive to light, neck supple Cardiovascular: Normal rate, irregular rhythm and normal heart sounds.  murmur heard. No BLE edema. Pulmonary/Chest: Effort normal and breath sounds normal. No respiratory distress. Abdominal: Soft.  There is no tenderness.  No CVA tenderness Psychiatric: Patient has a normal mood and affect. behavior is normal. Judgment and thought content normal. Results for orders placed or performed in visit on 03/08/22  Novel Coronavirus, NAA ([facility])   Specimen: Nasopharyngeal(NP) swabs in vial transport medium   Nasopharynge  Resident  Result Value Ref Range   SARS-CoV-2, NAA Not Detected Not Detected      Assessment & Plan:   Problem List Items Addressed This Visit       Cardiovascular and Mediastinum   PAF (paroxysmal atrial fibrillation) (HCC) (Chronic)    Condition stable she is currently taking  Eliquis 5 mg twice a day and metoprolol 50 mg daily.  Cardiology is Dr. Rockey Situ.  Last seen on 07/22/2021.      Essential hypertension    Currently taking metoprolol 50 mg daily.  Condition stable.      Relevant Orders   CBC with Differential/Platelet   COMPLETE METABOLIC PANEL WITH GFR   Aortic atherosclerosis ([place])    Last LDL was 139 11/03/2021 patient declines taking cholesterol-lowering medication.        Digestive   Cyst of pancreas     Endocrine   Hypothyroidism (Chronic)    Currently takes 50 mcg daily of levothyroxine.  Get labs.      Relevant Orders   TSH     Nervous and Auditory   Chronic sciatica of left side     Musculoskeletal and Integument   Localized osteoporosis without current pathological fracture - Primary    Patient states she has been on Fosamax for several years.  Looking back it looks like she has been on it for more than 5 years.  Will stop postvaccine this time.  She is due for a bone density in  March.  If any worsening will refer to endocrinology for further treatment.      Relevant Orders   DG Bone Density     Other   Chronic pain syndrome (Chronic)    [facility] she is currently taking gabapentin 300 mg at night and Tylenol arthritis 650 mg 3 times a day.      Dyslipidemia    Last LDL was 139 11/03/2021 patient declines taking cholesterol-lowering medication.      Relevant Orders   Lipid panel   Other Visit Diagnoses     Skin lesion       Placed referral to dermatology for removal.   Relevant Orders   Ambulatory referral to Dermatology   Right lower quadrant abdominal pain       Pain has been consistent patient's main complaint is pain for several years.  She has an appointment with urology coming up.        Follow up plan: Return in about 6 months (around 12/04/2022) for follow up, [facility] awv.

## 2022-06-05 ENCOUNTER — Ambulatory Visit: Payer: Medicare Other | Admitting: Nurse Practitioner

## 2022-06-05 ENCOUNTER — Encounter: Payer: Self-pay | Admitting: Nurse Practitioner

## 2022-06-05 ENCOUNTER — Other Ambulatory Visit: Payer: Self-pay

## 2022-06-05 VITALS — BP 126/82 | HR 81 | Temp 98.3°F | Resp 16 | Ht 69.0 in | Wt 237.3 lb

## 2022-06-05 DIAGNOSIS — M816 Localized osteoporosis [Lequesne]: Secondary | ICD-10-CM

## 2022-06-05 DIAGNOSIS — I10 Essential (primary) hypertension: Secondary | ICD-10-CM

## 2022-06-05 DIAGNOSIS — L989 Disorder of the skin and subcutaneous tissue, unspecified: Secondary | ICD-10-CM

## 2022-06-05 DIAGNOSIS — I700 Atherosclerosis of aorta: Secondary | ICD-10-CM

## 2022-06-05 DIAGNOSIS — E785 Hyperlipidemia, unspecified: Secondary | ICD-10-CM

## 2022-06-05 DIAGNOSIS — G894 Chronic pain syndrome: Secondary | ICD-10-CM

## 2022-06-05 DIAGNOSIS — I480 Paroxysmal atrial fibrillation: Secondary | ICD-10-CM

## 2022-06-05 DIAGNOSIS — E039 Hypothyroidism, unspecified: Secondary | ICD-10-CM

## 2022-06-05 DIAGNOSIS — R1031 Right lower quadrant pain: Secondary | ICD-10-CM

## 2022-06-05 DIAGNOSIS — M5432 Sciatica, left side: Secondary | ICD-10-CM

## 2022-06-05 DIAGNOSIS — K862 Cyst of pancreas: Secondary | ICD-10-CM

## 2022-06-05 NOTE — Assessment & Plan Note (Signed)
Patient states she has been on Fosamax for several years.  Looking back it looks like she has been on it for more than 5 years.  Will stop postvaccine this time.  She is due for a bone density in March.  If any worsening will refer to endocrinology for further treatment.

## 2022-06-05 NOTE — Assessment & Plan Note (Signed)
Condition stable she is currently taking Eliquis 5 mg twice a day and metoprolol 50 mg daily.  Cardiology is Dr. Rockey Situ.  Last seen on 07/22/2021.

## 2022-06-05 NOTE — Assessment & Plan Note (Signed)
Last LDL was 139 11/03/2021 patient declines taking cholesterol-lowering medication.

## 2022-06-05 NOTE — Assessment & Plan Note (Signed)
[facility] she is currently taking gabapentin 300 mg at night and Tylenol arthritis 650 mg 3 times a day.

## 2022-06-05 NOTE — Assessment & Plan Note (Signed)
Currently taking metoprolol 50 mg daily.  Condition stable.

## 2022-06-05 NOTE — Assessment & Plan Note (Signed)
Currently takes 50 mcg daily of levothyroxine.  Get labs.

## 2022-06-06 LAB — CBC WITH DIFFERENTIAL/PLATELET
Absolute Monocytes: 750 cells/uL (ref 200–950)
Basophils Absolute: 43 cells/uL (ref 0–200)
Basophils Relative: 0.7 %
Eosinophils Absolute: 122 cells/uL (ref 15–500)
Eosinophils Relative: 2 %
HCT: 41.3 % (ref 35.0–45.0)
Hemoglobin: 13.7 g/dL (ref 11.7–15.5)
Lymphs Abs: 1385 cells/uL (ref 850–3900)
MCH: 28.2 pg (ref 27.0–33.0)
MCHC: 33.2 g/dL (ref 32.0–36.0)
MCV: 85.2 fL (ref 80.0–100.0)
MPV: 9.8 fL (ref 7.5–12.5)
Monocytes Relative: 12.3 %
Neutro Abs: 3800 cells/uL (ref 1500–7800)
Neutrophils Relative %: 62.3 %
Platelets: 294 10*3/uL (ref 140–400)
RBC: 4.85 10*6/uL (ref 3.80–5.10)
RDW: 12.3 % (ref 11.0–15.0)
Total Lymphocyte: 22.7 %
WBC: 6.1 10*3/uL (ref 3.8–10.8)

## 2022-06-06 LAB — COMPLETE METABOLIC PANEL WITH GFR
AG Ratio: 1.6 (calc) (ref 1.0–2.5)
ALT: 12 U/L (ref 6–29)
AST: 14 U/L (ref 10–35)
Albumin: 4.5 g/dL (ref 3.6–5.1)
Alkaline phosphatase (APISO): 63 U/L (ref 37–153)
BUN: 17 mg/dL (ref 7–25)
CO2: 28 mmol/L (ref 20–32)
Calcium: 10.1 mg/dL (ref 8.6–10.4)
Chloride: 104 mmol/L (ref 98–110)
Creat: 0.84 mg/dL (ref 0.60–1.00)
Globulin: 2.8 g/dL (calc) (ref 1.9–3.7)
Glucose, Bld: 91 mg/dL (ref 65–99)
Potassium: 4.5 mmol/L (ref 3.5–5.3)
Sodium: 141 mmol/L (ref 135–146)
Total Bilirubin: 0.6 mg/dL (ref 0.2–1.2)
Total Protein: 7.3 g/dL (ref 6.1–8.1)
eGFR: 71 mL/min/{1.73_m2} (ref >=60)

## 2022-06-06 LAB — LIPID PANEL
Cholesterol: 215 mg/dL — ABNORMAL HIGH (ref <200)
HDL: 76 mg/dL (ref >=50)
LDL Cholesterol (Calc): 118 mg/dL (calc) — ABNORMAL HIGH
Non-HDL Cholesterol (Calc): 139 mg/dL (calc) — ABNORMAL HIGH (ref <130)
Total CHOL/HDL Ratio: 2.8 (calc) (ref <5.0)
Triglycerides: 99 mg/dL (ref <150)

## 2022-06-06 LAB — TSH: TSH: 1.22 mIU/L (ref 0.40–4.50)

## 2022-06-07 ENCOUNTER — Other Ambulatory Visit: Payer: Self-pay | Admitting: Nurse Practitioner

## 2022-06-07 NOTE — Telephone Encounter (Signed)
Medication Refill - Medication: methocarbamol (ROBAXIN) 750 MG tablet   Has the patient contacted their pharmacy? Yes.   No, more refills.   (Agent: If yes, when and what did the pharmacy advise?)  Preferred Pharmacy (with phone number or street name):  [id number] [address] [id number] Phone: [phone]: [phone] Hours: Not open 24 hours   Has the patient been seen for an appointment in the last year OR does the patient have an upcoming appointment? Yes.    Agent: Please be advised that RX refills may take up to 3 business days. We ask that you follow-up with your pharmacy.

## 2022-06-07 NOTE — Telephone Encounter (Signed)
Requested medication (s) are due for refill today - provider review   Requested medication (s) are on the active medication list -yes  Future visit scheduled -yes  Last refill: 10/10/20 listed as historical medication  Notes to clinic: non delegated Rx  Requested Prescriptions  Pending Prescriptions Disp Refills   methocarbamol (ROBAXIN) 750 MG tablet      Sig: Take 1 tablet (750 mg total) by mouth 2 (two) times daily as needed.     Not Delegated - Analgesics:  Muscle Relaxants Failed - 06/07/2022  2:30 PM      Failed - This refill cannot be delegated      Passed - Valid encounter within last 6 months    Recent Outpatient Visits           2 days ago Localized osteoporosis without current pathological fracture   [facility] Bo Merino, FNP   3 months ago Acute bronchitis, unspecified organism   [facility] Delsa Grana, PA-C   3 months ago Right lower quadrant abdominal pain   [facility] Bo Merino, FNP   7 months ago Dyslipidemia   [facility] Bo Merino, FNP   1 year ago Viral upper respiratory tract infection   [facility] Bo Merino, FNP       Future Appointments             In 3 months  [facility], [place]   In 6 months Bo Merino, [facility], [facility]               Requested Prescriptions  Pending Prescriptions Disp Refills   methocarbamol (ROBAXIN) 750 MG tablet      Sig: Take 1 tablet (750 mg total) by mouth 2 (two) times daily as needed.     Not Delegated - Analgesics:  Muscle Relaxants Failed - 06/07/2022  2:30 PM      Failed - This refill cannot be delegated      Passed - Valid encounter within last 6 months    Recent Outpatient Visits           2 days ago Localized osteoporosis without current pathological fracture   [place], FNP   3 months ago Acute bronchitis, unspecified organism   [facility] Delsa Grana, PA-C   3 months ago Right lower quadrant abdominal pain   [place], FNP   7 months ago Dyslipidemia   [facility] Bo Merino, FNP   1 year ago Viral upper respiratory tract infection   [place], FNP       Future Appointments             In 3 months  [facility], [place]   In 6 months Reece Packer, Myna Hidalgo, [facility], [facility]

## 2022-06-08 MED ORDER — METHOCARBAMOL 750 MG PO TABS: 750.0000 mg | ORAL_TABLET | Freq: Two times a day (BID) | ORAL | 1 refills | Status: AC | PRN

## 2022-06-27 ENCOUNTER — Telehealth: Payer: Self-pay | Admitting: Nurse Practitioner

## 2022-06-27 NOTE — Telephone Encounter (Unsigned)
Copied from [place] [phone]. Topic: General - Inquiry >> Jun 27, 2022  4:13 PM Carrielelia G wrote: Reason for CRM:  pt would like to speak with Nurse Reece Packer or Dr Rosana Berger. Patient has an appt on 3/13 with Dr Malissa Hippo (urologist) and had questions regarding access to records.

## 2022-06-28 ENCOUNTER — Ambulatory Visit: Payer: Medicare Other | Admitting: Podiatry

## 2022-07-12 ENCOUNTER — Encounter: Payer: Self-pay | Admitting: Nurse Practitioner

## 2022-07-16 ENCOUNTER — Other Ambulatory Visit: Payer: Self-pay | Admitting: Nurse Practitioner

## 2022-07-16 DIAGNOSIS — E039 Hypothyroidism, unspecified: Secondary | ICD-10-CM

## 2022-07-17 NOTE — Telephone Encounter (Signed)
Requested Prescriptions  Pending Prescriptions Disp Refills   levothyroxine (SYNTHROID) 50 MCG tablet [Pharmacy Med Name: LEVOTHYROXINE 50 MCG TABLET] 90 tablet 3    Sig: TAKE 1 TABLET BY MOUTH DAILY BEFORE BREAKFAST     Endocrinology:  Hypothyroid Agents Passed - 07/16/2022  1:11 AM      Passed - TSH in normal range and within 360 days    TSH  Date Value Ref Range Status  06/05/2022 1.22 0.40 - 4.50 mIU/L Final         Passed - Valid encounter within last 12 months    Recent Outpatient Visits           1 month ago Localized osteoporosis without current pathological fracture   [place], FNP   4 months ago Acute bronchitis, unspecified organism   [facility] Delsa Grana, PA-C   4 months ago Right lower quadrant abdominal pain   [facility] Bo Merino, FNP   8 months ago Dyslipidemia   [facility] Bo Merino, FNP   1 year ago Viral upper respiratory tract infection   [place], FNP       Future Appointments             In 1 month Gollan, Kathlene November, MD [place] [facility] [place]   In [facility], [place]   In 4 months Reece Packer, Myna Hidalgo, [facility], [facility]

## 2022-07-18 ENCOUNTER — Other Ambulatory Visit: Payer: Self-pay | Admitting: Nurse Practitioner

## 2022-07-18 DIAGNOSIS — I480 Paroxysmal atrial fibrillation: Secondary | ICD-10-CM

## 2022-07-18 DIAGNOSIS — I10 Essential (primary) hypertension: Secondary | ICD-10-CM

## 2022-07-19 NOTE — Telephone Encounter (Signed)
Unable to refill per protocol, Rx request is too soon. Last refill 06/07/22 for 90 days.  Requested Prescriptions  Pending Prescriptions Disp Refills   metoprolol succinate (TOPROL-XL) 50 MG 24 hr tablet [Pharmacy Med Name: METOPROLOL SUCC ER 50 MG TAB] 90 tablet 0    Sig: TAKE 1 TABLET BY MOUTH EVERY DAY WITH OR IMMEDIATELY FOLLOWING A MEAL     Cardiovascular:  Beta Blockers Passed - 07/18/2022 11:20 PM      Passed - Last BP in normal range    BP Readings from Last 1 Encounters:  06/05/22 126/82         Passed - Last Heart Rate in normal range    Pulse Readings from Last 1 Encounters:  06/05/22 81         Passed - Valid encounter within last 6 months    Recent Outpatient Visits           1 month ago Localized osteoporosis without current pathological fracture   [facility] Bo Merino, FNP   4 months ago Acute bronchitis, unspecified organism   [facility] Delsa Grana, PA-C   4 months ago Right lower quadrant abdominal pain   [facility] Bo Merino, FNP   8 months ago Dyslipidemia   [facility] Bo Merino, FNP   1 year ago Viral upper respiratory tract infection   [facility] Bo Merino, FNP       Future Appointments             In 1 month Gollan, Kathlene November, MD [place] at [place]   In [facility], [place]   In 4 months Reece Packer, Myna Hidalgo, [facility], [facility]

## 2022-07-20 ENCOUNTER — Other Ambulatory Visit: Payer: Self-pay | Admitting: Nurse Practitioner

## 2022-07-20 DIAGNOSIS — Z1231 Encounter for screening mammogram for malignant neoplasm of breast: Secondary | ICD-10-CM

## 2022-08-02 ENCOUNTER — Encounter: Payer: Self-pay | Admitting: Podiatry

## 2022-08-02 ENCOUNTER — Ambulatory Visit: Payer: Medicare Other | Admitting: Podiatry

## 2022-08-02 DIAGNOSIS — D689 Coagulation defect, unspecified: Secondary | ICD-10-CM

## 2022-08-02 DIAGNOSIS — D2372 Other benign neoplasm of skin of left lower limb, including hip: Secondary | ICD-10-CM

## 2022-08-02 DIAGNOSIS — B351 Tinea unguium: Secondary | ICD-10-CM

## 2022-08-02 DIAGNOSIS — M79676 Pain in unspecified toe(s): Secondary | ICD-10-CM

## 2022-08-02 DIAGNOSIS — D2371 Other benign neoplasm of skin of right lower limb, including hip: Secondary | ICD-10-CM | POA: Diagnosis not present

## 2022-08-02 NOTE — Progress Notes (Signed)
She presents today chief complaint of painful elongated toenails and calluses bilaterally.  Objective: Toenails are long thick yellow dystrophic onychomycotic no open lesions or wounds are noted.  Assessment: Pain limb secondary onychomycosis benign skin lesions.  Plan: Debrided benign skin lesion debridement calluses.  Toenails.

## 2022-08-15 ENCOUNTER — Ambulatory Visit
Admission: RE | Admit: 2022-08-15 | Discharge: 2022-08-15 | Disposition: A | Discharge status: Home / Self Care | Payer: Medicare Other | Source: Ambulatory Visit | Attending: Nurse Practitioner | Admitting: Nurse Practitioner

## 2022-08-15 DIAGNOSIS — Z1231 Encounter for screening mammogram for malignant neoplasm of breast: Secondary | ICD-10-CM

## 2022-08-24 ENCOUNTER — Other Ambulatory Visit: Payer: Self-pay | Admitting: Nurse Practitioner

## 2022-08-24 DIAGNOSIS — I10 Essential (primary) hypertension: Secondary | ICD-10-CM

## 2022-08-24 DIAGNOSIS — I480 Paroxysmal atrial fibrillation: Secondary | ICD-10-CM

## 2022-08-24 NOTE — Telephone Encounter (Signed)
Requested Prescriptions  Pending Prescriptions Disp Refills   metoprolol succinate (TOPROL-XL) 50 MG 24 hr tablet [Pharmacy Med Name: METOPROLOL SUCC ER 50 MG TAB] 90 tablet 1    Sig: TAKE 1 TABLET BY MOUTH EVERY DAY WITH OR IMMEDIATELY FOLLOWING A MEAL     Cardiovascular:  Beta Blockers Passed - 08/24/2022  2:18 AM      Passed - Last BP in normal range    BP Readings from Last 1 Encounters:  06/05/22 126/82         Passed - Last Heart Rate in normal range    Pulse Readings from Last 1 Encounters:  06/05/22 81         Passed - Valid encounter within last 6 months    Recent Outpatient Visits           2 months ago Localized osteoporosis without current pathological fracture   [facility] Berniece Salines, FNP   5 months ago Acute bronchitis, unspecified organism   [facility] Danelle Berry, PA-C   5 months ago Right lower quadrant abdominal pain   [facility] Berniece Salines, FNP   9 months ago Dyslipidemia   [facility] Berniece Salines, FNP   1 year ago Viral upper respiratory tract infection   [facility] Berniece Salines, FNP       Future Appointments             In 2 weeks  [facility], [facility]   In 3 months Gollan, Tollie Pizza, MD [facility]   In 3 months Zane Herald, Rudolpho Sevin, FNP [facility], [facility]

## 2022-08-28 ENCOUNTER — Ambulatory Visit: Payer: Medicare Other | Admitting: Cardiovascular Disease

## 2022-09-07 ENCOUNTER — Ambulatory Visit (INDEPENDENT_AMBULATORY_CARE_PROVIDER_SITE_OTHER): Payer: Medicare Other

## 2022-09-07 VITALS — Ht 69.0 in | Wt 231.0 lb

## 2022-09-07 DIAGNOSIS — Z0000 Encounter for general adult medical examination without abnormal findings: Secondary | ICD-10-CM | POA: Diagnosis not present

## 2022-09-07 NOTE — Progress Notes (Signed)
I connected with  Rebecca Anthony on 09/07/22 by a audio enabled telemedicine application and verified that I am speaking with the correct person using two identifiers.  Patient Location: Home  Provider Location: Home Office  I discussed the limitations of evaluation and management by telemedicine. The patient expressed understanding and agreed to proceed.  Subjective:   Rebecca Anthony is a 77 y.o. female who presents for Medicare Annual (Subsequent) preventive examination.  Review of Systems    Cardiac Risk Factors include: advanced age (>65men, >73 women);hypertension;dyslipidemia;obesity (BMI >30kg/m2);sedentary lifestyle    Objective:    Today's Vitals   09/07/22 1505  Weight: 231 lb (104.8 kg)  Height: 5\' 9"  (1.753 m)  PainSc: 5    Body mass index is 34.11 kg/m.     09/07/2022    3:24 PM 04/06/2020    3:08 PM 02/13/2019    3:40 PM 01/03/2018    2:58 PM 02/14/2017    3:17 PM 01/29/2017    2:53 PM 09/06/2016    3:50 PM  Advanced Directives  Does Patient Have a Medical Advance Directive? No Yes Yes Yes No No No  Type of Special educational needs teacher of [place];Living will Healthcare Power of [place];Living will Healthcare Power of [place];Living will     Copy of Healthcare Power of Attorney in Chart?  No - copy requested No - copy requested No - copy requested       Current Medications (verified) Outpatient Encounter Medications as of 09/07/2022  Medication Sig   calcium carbonate (OSCAL) 1500 (600 Ca) MG TABS tablet Take by mouth.    ELIQUIS 5 MG TABS tablet TAKE 1 TABLET BY MOUTH TWICE A DAY   gabapentin (NEURONTIN) 300 MG capsule TAKE 1 CAPSULE BY MOUTH EVERYDAY AT BEDTIME   HORSE CHESTNUT PO Take 1 tablet by mouth daily.   levothyroxine (SYNTHROID) 50 MCG tablet TAKE 1 TABLET BY MOUTH DAILY BEFORE BREAKFAST   methocarbamol (ROBAXIN) 750 MG tablet Take 1 tablet (750 mg total) by mouth 2 (two) times daily as needed for muscle spasms.   metoprolol succinate  (TOPROL-XL) 50 MG 24 hr tablet TAKE 1 TABLET BY MOUTH EVERY DAY WITH OR IMMEDIATELY FOLLOWING A MEAL   polyethylene glycol powder (GLYCOLAX/MIRALAX) powder Take 17 g by mouth daily. Dissolve 1 capful in 8 oz of liquid daily PRN (Patient taking differently: Take 17 g by mouth daily as needed (constipation.).)   vitamin C (ASCORBIC ACID) 500 MG tablet Take 500 mg by mouth daily.   albuterol (VENTOLIN HFA) 108 (90 Base) MCG/ACT inhaler Inhale 2 puffs into the lungs every 4 (four) hours as needed for wheezing or shortness of breath. (Patient not taking: Reported on 06/05/2022)   No facility-administered encounter medications on file as of 09/07/2022.    Allergies (verified) Anaprox [naproxen sodium]   History: Past Medical History:  Diagnosis Date   Chronic abdominal pain (LLQ) (Primary Area of Pain) (Left) 10/31/2017   Chronic ankle pain (Fourth Area of Pain) (Left) 11/07/2017   Chronic low back pain (Secondary Area of Pain) (Bilateral) (L>R) 10/31/2017   Chronic upper back pain [facility] Area of Pain) (Bilateral) (L>R) 10/31/2017   Diverticulitis    Diverticulosis    Dysuria    Heart murmur    Hematuria, unspecified    History of shingles    History of swelling of feet    Hypercholesterolemia    Hypertension    Hypothyroidism    Irritable bowel syndrome    Lichenification and lichen simplex  chronicus    Localized superficial swelling, mass, or lump    Onychomycosis 03/23/2016   Osteoarthrosis, unspecified whether generalized or localized, unspecified site    Osteoporosis, unspecified    PAF (paroxysmal atrial fibrillation) (HCC)    Diagnosed years ago -- originally on Quinidine   Sigmoid diverticulosis 11/28/2017   Sinus problem    Synovial cyst, unspecified    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   Past Surgical History:  Procedure Laterality Date   ABDOMINAL HYSTERECTOMY     ANKLE SURGERY Left    Torned tendon   APPENDECTOMY     AV FISTULA REPAIR     BUNIONECTOMY     CATARACT  EXTRACTION W/PHACO Right 01/02/2019   Procedure: CATARACT EXTRACTION PHACO AND INTRAOCULAR LENS PLACEMENT (IOC) RIGHT;  Surgeon: Galen Manila, MD;  Location: [facility] ORS;  Service: Ophthalmology;  Laterality: Right;  [place] 00:42 CDE 7.91 fluid pack lot # [id number]   CATARACT EXTRACTION W/PHACO Left 02/07/2019   Procedure: CATARACT EXTRACTION PHACO AND INTRAOCULAR LENS PLACEMENT (IOC) LEFT;  Surgeon: Galen Manila, MD;  Location: [facility] ORS;  Service: Ophthalmology;  Laterality: Left;  [place] 00:36.3CDE 5.01Fluid Pack Lot # [id number]   COLONOSCOPY     FOOT SURGERY Left    VAGINAL HYSTERECTOMY     VESICOVAGINAL FISTULA CLOSURE W/ TAH     Family History  Problem Relation Age of Onset   Diabetes Mother    Hypertension Mother    Heart failure Mother    Heart disease Father    Hypertension Father    Thyroid disease Sister    Healthy Sister    Breast cancer Neg Hx    Social History   Socioeconomic History   Marital status: Married    Spouse name: Fayrene Fearing   Number of children: 1   Years of education: Not on file   Highest education level: Bachelor's degree (e.g., BA, AB, BS)  Occupational History   Occupation: Retired  Tobacco Use   Smoking status: Never   Smokeless tobacco: Never   Tobacco comments:    smoking cessation materials not required  Vaping Use   Vaping Use: Never used  Substance and Sexual Activity   Alcohol use: No   Drug use: No   Sexual activity: Yes    Birth control/protection: None, Surgical  Other Topics Concern   Not on file  Social History Narrative   Recently moved from [facility] back to [place].   Social Determinants of Health   Financial Resource Strain: Low Risk  (09/07/2022)   Overall Financial Resource Strain (CARDIA)    Difficulty of Paying Living Expenses: Not hard at all  Food Insecurity: No Food Insecurity (09/07/2022)   Hunger Vital Sign    Worried About Running Out of Food in the Last Year: Never true    Ran Out of Food in the Last Year: Never true   Transportation Needs: No Transportation Needs (09/07/2022)   PRAPARE - Administrator, Civil Service (Medical): No    Lack of Transportation (Non-Medical): No  Physical Activity: Insufficiently Active (09/07/2022)   Exercise Vital Sign    Days of Exercise per Week: 1 day    Minutes of Exercise per Session: 60 min  Stress: No Stress Concern Present (09/07/2022)   [employer] of Occupational Health - Occupational Stress Questionnaire    Feeling of Stress : Not at all  Social Connections: Socially Integrated (09/07/2022)   Social Connection and Isolation Panel [NHANES]    Frequency of Communication with Friends and Family:  More than three times a week    Frequency of Social Gatherings with Friends and Family: More than three times a week    Attends Religious Services: More than 4 times per year    Active Member of Clubs or Organizations: Yes    Attends Engineer, structural: More than 4 times per year    Marital Status: Married    Tobacco Counseling Counseling given: Not Answered Tobacco comments: smoking cessation materials not required   Clinical Intake:  Pre-visit preparation completed: Yes  Pain : 0-10 Pain Score: 5  Pain Type: Chronic pain Pain Location: Back Pain Orientation: Lower Pain Onset: More than a month ago Pain Frequency: Intermittent Pain Relieving Factors: Tylenol arthritis, exercises, PT therapy, gabapentin  Pain Relieving Factors: Tylenol arthritis, exercises, PT therapy, gabapentin  BMI - recorded: 34.11 Nutritional Status: BMI > 30  Obese Nutritional Risks: None Diabetes: No  How often do you need to have someone help you when you read instructions, pamphlets, or other written materials from your doctor or pharmacy?: 1 - Never  Diabetic?no  Interpreter Needed?: No  Comments: lives with husband Information entered by :: B.Reshard Guillet,LPN   Activities of Daily Living    09/07/2022    3:28 PM 06/05/2022    2:37 PM  In your  present state of health, do you have any difficulty performing the following activities:  Hearing? 0 0  Vision? 0 0  Difficulty concentrating or making decisions? 0 0  Walking or climbing stairs? 1 1  Dressing or bathing? 0 0  Doing errands, shopping? 1 1  Comment husband helps   Preparing Food and eating ? N   Using the Toilet? N   In the past six months, have you accidently leaked urine? N   Do you have problems with loss of bowel control? N   Managing your Medications? N   Managing your Finances? N   Housekeeping or managing your Housekeeping? N     Patient Care Team: Berniece Salines, FNP as PCP - General (Nurse Practitioner) Galen Manila, MD as Consulting Physician (Ophthalmology) Romero Belling, MD (Inactive) as Consulting Physician (Endocrinology) Antonieta Iba, MD as Consulting Physician (Cardiology) Bud Face, MD as Consulting Physician (Otolaryngology) Wyline Mood, MD as Consulting Physician (Gastroenterology) Venetia Night, MD as Consulting Physician (Neurosurgery) Delano Metz, MD as Referring Physician (Pain Medicine) Juanell Fairly, RN as Case Manager  Indicate any recent Medical Services you may have received from other than [facility] providers in the past year (date may be approximate).     Assessment:   This is a routine wellness examination for Rebecca Anthony.  Hearing/Vision screen Hearing Screening - Comments:: Adequate hearing Vision Screening - Comments:: Adequate vision w/glasses Dr Leda Quail  Dietary issues and exercise activities discussed: Current Exercise Habits: The patient does not participate in regular exercise at present, Exercise limited by: cardiac condition(s);orthopedic condition(s);neurologic condition(s)   Goals Addressed             This Visit's Progress    Increase physical activity   Not on track    Recommend increasing physical activity to 150 minutes per week       Depression Screen    09/07/2022    3:21  PM 09/07/2022    3:20 PM 06/05/2022    2:37 PM 03/08/2022    2:05 PM 03/01/2022    3:21 PM 11/03/2021    3:00 PM 05/17/2021   12:46 PM  PHQ 2/9 Scores  PHQ - 2 Score 0 0 0 0  0 0 0  PHQ- 9 Score    0  0     Fall Risk    09/07/2022    3:11 PM 06/05/2022    2:37 PM 03/08/2022    2:05 PM 03/01/2022    3:21 PM 11/08/2021    4:33 PM  Fall Risk   Falls in the past year? 0 0 0 0 0  Number falls in past yr: 0 0 0 0   Injury with Fall? 0 0 0 0   Risk for fall due to : No Fall Risks Impaired balance/gait No Fall Risks    Follow up Education provided;Falls prevention discussed  Falls prevention discussed;Education provided;Falls evaluation completed Falls evaluation completed     FALL RISK PREVENTION PERTAINING TO THE HOME:  Any stairs in or around the home? Yes  If so, are there any without handrails? Yes  Home free of loose throw rugs in walkways, pet beds, electrical cords, etc? Yes  Adequate lighting in your home to reduce risk of falls? Yes   ASSISTIVE DEVICES UTILIZED TO PREVENT FALLS:  Life alert? No  Use of a cane, walker or w/c? Yes  Grab bars in the bathroom? No  Shower chair or bench in shower? Yes  Elevated toilet seat or a handicapped toilet? Yes    Cognitive Function:        09/07/2022    3:36 PM 02/13/2019    3:45 PM 01/03/2018    3:10 PM  6CIT Screen  What Year? 0 points 0 points 0 points  What month? 0 points 0 points 0 points  What time? 0 points 0 points 0 points  Count back from 20 0 points 0 points 0 points  Months in reverse 0 points 0 points 0 points  Repeat phrase 0 points 0 points 0 points  Total Score 0 points 0 points 0 points    Immunizations Immunization History  Administered Date(s) Administered   Fluad Quad(high Dose 65+) 01/24/2019, 03/19/2020   Influenza, High Dose Seasonal PF 01/20/2016, 02/14/2017, 12/31/2017, 02/04/2021   Influenza-Unspecified 02/09/2021   PFIZER Comirnaty(Gray Top)Covid-19 Tri-Sucrose Vaccine 06/09/2019, 06/30/2019    PFIZER(Purple Top)SARS-COV-2 Vaccination 06/09/2019, 06/30/2019, 01/30/2020   Pfizer Covid-19 Vaccine Bivalent Booster 80yrs & up 02/04/2021   Pneumococcal Conjugate-13 01/20/2016   Pneumococcal Polysaccharide-23 02/21/2010   Tdap 05/24/2010   Zoster, Live 11/25/2015    TDAP status: Up to date  Flu Vaccine status: Up to date  Pneumococcal vaccine status: Up to date  Covid-19 vaccine status: Completed vaccines  Qualifies for Shingles Vaccine? Yes   Zostavax completed No   Shingrix Completed?: No.    Education has been provided regarding the importance of this vaccine. Patient has been advised to call insurance company to determine out of pocket expense if they have not yet received this vaccine. Advised may also receive vaccine at local pharmacy or Health Dept. Verbalized acceptance and understanding.  Screening Tests Health Maintenance  Topic Date Due   Zoster Vaccines- Shingrix (1 of 2) Never done   DTaP/Tdap/Td (2 - Td or Tdap) 05/24/2020   COVID-19 Vaccine (7 - 2023-24 season) 12/16/2021   INFLUENZA VACCINE  11/16/2022   MAMMOGRAM  08/15/2023   Medicare Annual Wellness (AWV)  09/07/2023   Pneumonia Vaccine 55+ Years old  Completed   DEXA SCAN  Completed   Hepatitis C Screening  Completed   HPV VACCINES  Aged Out   Fecal DNA (Cologuard)  Discontinued    Health Maintenance  Health Maintenance Due  Topic Date  Due   Zoster Vaccines- Shingrix (1 of 2) Never done   DTaP/Tdap/Td (2 - Td or Tdap) 05/24/2020   COVID-19 Vaccine (7 - 2023-24 season) 12/16/2021    Colorectal cancer screening: No longer required.   Mammogram status: No longer required due to age.  Bone Density status: Completed yes. Results reflect: Bone density results: OSTEOPENIA. Repeat every 3 years.  Lung Cancer Screening: (Low Dose CT Chest recommended if Age 27-80 years, 30 pack-year currently smoking OR have quit w/in 15years.) does not qualify.   Lung Cancer Screening Referral: no  Additional  Screening:  Hepatitis C Screening: does not qualify; Completed yes  Vision Screening: Recommended annual ophthalmology exams for early detection of glaucoma and other disorders of the eye. Is the patient up to date with their annual eye exam?  Yes  Who is the provider or what is the name of the office in which the patient attends annual eye exams? Dr Lillie Fragmin If pt is not established with a provider, would they like to be referred to a provider to establish care? No .   Dental Screening: Recommended annual dental exams for proper oral hygiene  Community Resource Referral / Chronic Care Management: CRR required this visit?  No   CCM required this visit?  No      Plan:     I have personally reviewed and noted the following in the patient's chart:   Medical and social history Use of alcohol, tobacco or illicit drugs  Current medications and supplements including opioid prescriptions. Patient is not currently taking opioid prescriptions. Functional ability and status Nutritional status Physical activity Advanced directives List of other physicians Hospitalizations, surgeries, and ER visits in previous 12 months Vitals Screenings to include cognitive, depression, and falls Referrals and appointments  In addition, I have reviewed and discussed with patient certain preventive protocols, quality metrics, and best practice recommendations. A written personalized care plan for preventive services as well as general preventive health recommendations were provided to patient.     Sue Lush, LPN   1/61/0960   Nurse Notes: pt is doing well. She relays she continues to managing chronic pain, She has no concerns or questions at this time.  *Advance directive paperwork mailed to pt.

## 2022-09-07 NOTE — Patient Instructions (Signed)
Ms. Rebecca Anthony , Thank you for taking time to come for your Medicare Wellness Visit. I appreciate your ongoing commitment to your health goals. Please review the following plan we discussed and let me know if I can assist you in the future.   These are the goals we discussed:  Goals      Chronic Disease Management     Current Barriers:  Chronic Disease Management support and education needs related to Hypertension, Chronic Atrial Fibrillation and Dyslipidemia. (Hx of Chronic Pain Syndrome)  Nurse Case Manager Clinical Goal(s):  Over the next 120 days, patient: Will not require hospitalization or emergency care d/t complications r/t chronic illnesses. Will monitor BP and record readings. Will continue compliance with recommended DASH diet. Will take all medications as prescribed. Will attend medical appointments as scheduled. Will follow recommended safety measures to prevent falls and injuries. Over the next 60 days, patient: Will complete Bone Density Scan Will schedule and complete mammogram. Will schedule and complete routine Cardiology outreach.  Interventions:  Inter-disciplinary care team collaboration (see longitudinal plan of care) Reviewed medications and indications for use. Advised to take all medications as prescribed and notify provider if unable to tolerate regimen. Encouraged to contact the care management team with concerns regarding medication management or prescription costs.  Discussed recommendations regarding Hypertension self-management. Encouraged to continue recommended DASH diet. Discussed established BP parameters and indications for notifying a provider. Encouraged to monitor BP a few times a week if unable to monitor daily and record readings.   Provided information regarding safety and fall prevention measures. Encouraged to keep assistive device readily available to use when needed d/t chronic back discomfort and unstable gait. Encouraged to keep pathways clear  and well lit. Reports completing Physical Therapy once a week. She has a cane available to use as needed. Denies recent falls.   Reviewed pending/scheduled appointments. Encouraged to attend medical appointments as scheduled to prevent delays in care. Encouraged to contact the care management team with concerns regarding transportation.  Discussed plans for ongoing care management follow up. Provided direct contact information.   Patient Self Care Activities:  Self administers medications. Calls pharmacy for medication refills. Performs ADL's independently Performs IADL's independently Contacts provider office for new concerns or questions   Initial goal documentation      DIET - REDUCE SODIUM INTAKE     Recommend to begin DASH diet (Diet attached to AVS).     Health Maintenance     Care Coordination Interventions:      Increase physical activity     Recommend increasing physical activity to 150 minutes per week        This is a list of the screening recommended for you and due dates:  Health Maintenance  Topic Date Due   Zoster (Shingles) Vaccine (1 of 2) Never done   DTaP/Tdap/Td vaccine (2 - Td or Tdap) 05/24/2020   COVID-19 Vaccine (7 - 2023-24 season) 12/16/2021   Flu Shot  11/16/2022   Mammogram  08/15/2023   Medicare Annual Wellness Visit  09/07/2023   Pneumonia Vaccine  Completed   DEXA scan (bone density measurement)  Completed   Hepatitis C Screening  Completed   HPV Vaccine  Aged Out   Cologuard (Stool DNA test)  Discontinued    Advanced directives: no mailed to pt  Conditions/risks identified: low fall risk  Next appointment: Follow up in one year for your annual wellness visit 09/13/2023 @3pm  telephone   Preventive Care 65 Years and Older, Female Preventive care  refers to lifestyle choices and visits with your health care provider that can promote health and wellness. What does preventive care include? A yearly physical exam. This is also called an  annual well check. Dental exams once or twice a year. Routine eye exams. Ask your health care provider how often you should have your eyes checked. Personal lifestyle choices, including: Daily care of your teeth and gums. Regular physical activity. Eating a healthy diet. Avoiding tobacco and drug use. Limiting alcohol use. Practicing safe sex. Taking low-dose aspirin every day. Taking vitamin and mineral supplements as recommended by your health care provider. What happens during an annual well check? The services and screenings done by your health care provider during your annual well check will depend on your age, overall health, lifestyle risk factors, and family history of disease. Counseling  Your health care provider may ask you questions about your: Alcohol use. Tobacco use. Drug use. Emotional well-being. Home and relationship well-being. Sexual activity. Eating habits. History of falls. Memory and ability to understand (cognition). Work and work Astronomer. Reproductive health. Screening  You may have the following tests or measurements: Height, weight, and BMI. Blood pressure. Lipid and cholesterol levels. These may be checked every 5 years, or more frequently if you are over 20 years old. Skin check. Lung cancer screening. You may have this screening every year starting at age 2 if you have a 30-pack-year history of smoking and currently smoke or have quit within the past 15 years. Fecal occult blood test (FOBT) of the stool. You may have this test every year starting at age 70. Flexible sigmoidoscopy or colonoscopy. You may have a sigmoidoscopy every 5 years or a colonoscopy every 10 years starting at age 66. Hepatitis C blood test. Hepatitis B blood test. Sexually transmitted disease (STD) testing. Diabetes screening. This is done by checking your blood sugar (glucose) after you have not eaten for a while (fasting). You may have this done every 1-3 years. Bone  density scan. This is done to screen for osteoporosis. You may have this done starting at age 23. Mammogram. This may be done every 1-2 years. Talk to your health care provider about how often you should have regular mammograms. Talk with your health care provider about your test results, treatment options, and if necessary, the need for more tests. Vaccines  Your health care provider may recommend certain vaccines, such as: Influenza vaccine. This is recommended every year. Tetanus, diphtheria, and acellular pertussis (Tdap, Td) vaccine. You may need a Td booster every 10 years. Zoster vaccine. You may need this after age 40. Pneumococcal 13-valent conjugate (PCV13) vaccine. One dose is recommended after age 31. Pneumococcal polysaccharide (PPSV23) vaccine. One dose is recommended after age 45. Talk to your health care provider about which screenings and vaccines you need and how often you need them. This information is not intended to replace advice given to you by your health care provider. Make sure you discuss any questions you have with your health care provider. Document Released: 04/30/2015 Document Revised: 12/22/2015 Document Reviewed: 02/02/2015 Elsevier Interactive Patient Education  2017 [employer].  Fall Prevention in the Home Falls can cause injuries. They can happen to people of all ages. There are many things you can do to make your home safe and to help prevent falls. What can I do on the outside of my home? Regularly fix the edges of walkways and driveways and fix any cracks. Remove anything that might make you trip as you walk  through a door, such as a raised step or threshold. Trim any bushes or trees on the path to your home. Use bright outdoor lighting. Clear any walking paths of anything that might make someone trip, such as rocks or tools. Regularly check to see if handrails are loose or broken. Make sure that both sides of any steps have handrails. Any raised  decks and porches should have guardrails on the edges. Have any leaves, snow, or ice cleared regularly. Use sand or salt on walking paths during winter. Clean up any spills in your garage right away. This includes oil or grease spills. What can I do in the bathroom? Use night lights. Install grab bars by the toilet and in the tub and shower. Do not use towel bars as grab bars. Use non-skid mats or decals in the tub or shower. If you need to sit down in the shower, use a plastic, non-slip stool. Keep the floor dry. Clean up any water that spills on the floor as soon as it happens. Remove soap buildup in the tub or shower regularly. Attach bath mats securely with double-sided non-slip rug tape. Do not have throw rugs and other things on the floor that can make you trip. What can I do in the bedroom? Use night lights. Make sure that you have a light by your bed that is easy to reach. Do not use any sheets or blankets that are too big for your bed. They should not hang down onto the floor. Have a firm chair that has side arms. You can use this for support while you get dressed. Do not have throw rugs and other things on the floor that can make you trip. What can I do in the kitchen? Clean up any spills right away. Avoid walking on wet floors. Keep items that you use a lot in easy-to-reach places. If you need to reach something above you, use a strong step stool that has a grab bar. Keep electrical cords out of the way. Do not use floor polish or wax that makes floors slippery. If you must use wax, use non-skid floor wax. Do not have throw rugs and other things on the floor that can make you trip. What can I do with my stairs? Do not leave any items on the stairs. Make sure that there are handrails on both sides of the stairs and use them. Fix handrails that are broken or loose. Make sure that handrails are as long as the stairways. Check any carpeting to make sure that it is firmly attached  to the stairs. Fix any carpet that is loose or worn. Avoid having throw rugs at the top or bottom of the stairs. If you do have throw rugs, attach them to the floor with carpet tape. Make sure that you have a light switch at the top of the stairs and the bottom of the stairs. If you do not have them, ask someone to add them for you. What else can I do to help prevent falls? Wear shoes that: Do not have high heels. Have rubber bottoms. Are comfortable and fit you well. Are closed at the toe. Do not wear sandals. If you use a stepladder: Make sure that it is fully opened. Do not climb a closed stepladder. Make sure that both sides of the stepladder are locked into place. Ask someone to hold it for you, if possible. Clearly mark and make sure that you can see: Any grab bars or handrails. First and  last steps. Where the edge of each step is. Use tools that help you move around (mobility aids) if they are needed. These include: Canes. Walkers. Scooters. Crutches. Turn on the lights when you go into a dark area. Replace any light bulbs as soon as they burn out. Set up your furniture so you have a clear path. Avoid moving your furniture around. If any of your floors are uneven, fix them. If there are any pets around you, be aware of where they are. Review your medicines with your doctor. Some medicines can make you feel dizzy. This can increase your chance of falling. Ask your doctor what other things that you can do to help prevent falls. This information is not intended to replace advice given to you by your health care provider. Make sure you discuss any questions you have with your health care provider. Document Released: 01/28/2009 Document Revised: 09/09/2015 Document Reviewed: 05/08/2014 Elsevier Interactive Patient Education  2017 [employer].

## 2022-10-02 ENCOUNTER — Other Ambulatory Visit: Payer: Self-pay | Admitting: Podiatry

## 2022-11-01 ENCOUNTER — Encounter: Payer: Self-pay | Admitting: Podiatry

## 2022-11-01 ENCOUNTER — Ambulatory Visit: Payer: Medicare Other | Admitting: Podiatry

## 2022-11-01 DIAGNOSIS — D689 Coagulation defect, unspecified: Secondary | ICD-10-CM

## 2022-11-01 DIAGNOSIS — D2372 Other benign neoplasm of skin of left lower limb, including hip: Secondary | ICD-10-CM | POA: Diagnosis not present

## 2022-11-01 DIAGNOSIS — B351 Tinea unguium: Secondary | ICD-10-CM

## 2022-11-01 DIAGNOSIS — D2371 Other benign neoplasm of skin of right lower limb, including hip: Secondary | ICD-10-CM

## 2022-11-01 DIAGNOSIS — M79676 Pain in unspecified toe(s): Secondary | ICD-10-CM

## 2022-11-01 MED ORDER — NAFTIFINE HCL 2 % EX CREA: 1.0000 [drp] | TOPICAL_CREAM | CUTANEOUS | 2 refills | Status: DC

## 2022-11-01 NOTE — Progress Notes (Signed)
She presents today chief complaint of painful elongated toenails and calluses plantar aspect of the forefoot.  She also has a rash to the dorsal aspect of the foot.  Objective: Vital signs are stable alert and oriented x 3.  Pulses are palpable.  Posterior tibial tendon dysfunction right foot.  She also has painful elongated thick yellow dystrophic onychomycotic toenails with a painful benign skin lesion subfirst metatarsal phalangeal joint right foot primarily.  Assessment: Pain in limb secondary to onychomycosis and benign skin lesion.  Tinea pedis.  Plan: Prescribed Naftin cream which was sent over electronically.  Also debrided the benign skin lesion and toenails 1 through 5.

## 2022-11-10 ENCOUNTER — Ambulatory Visit: Payer: Medicare Other | Admitting: Nurse Practitioner

## 2022-11-14 ENCOUNTER — Telehealth: Payer: Self-pay | Admitting: Nurse Practitioner

## 2022-11-14 NOTE — Telephone Encounter (Signed)
Copied from CRM [id number]. Topic: General - Other >> Nov 14, 2022  1:41 PM Dondra Prader E wrote: Reason for CRM: Pt called reporting that she has a suspicious mole that she wants to see a dermatologist for. Requesting to speak to Jerrilyn Cairo her current location for dermatology cannot see her soon enough  She wants to go back to [facility] in [place], says she has seen this provider before.

## 2022-11-16 NOTE — Telephone Encounter (Signed)
Left message for patient to call dermatologist since she was already a patient there she maybe able to be seen there again

## 2022-11-17 NOTE — Telephone Encounter (Addendum)
Patient called and stated she had tried to get an appointment with Dr. Cheree Ditto (dermatologist) and Dr Cheree Ditto does not have any openings until November, and patient was wanting to know if [place], [place] or Della Goo, FNP could possibly suggest another dermatologist in the [facility] area that can get her in sooner?   Patients callback # [phone]

## 2022-11-19 ENCOUNTER — Other Ambulatory Visit: Payer: Self-pay | Admitting: Cardiovascular Disease

## 2022-11-19 DIAGNOSIS — I480 Paroxysmal atrial fibrillation: Secondary | ICD-10-CM

## 2022-11-20 ENCOUNTER — Telehealth: Payer: Self-pay | Admitting: Cardiovascular Disease

## 2022-11-20 NOTE — Telephone Encounter (Signed)
Copied from CRM [id number]. Topic: General - Other >> Nov 20, 2022  8:35 AM Everette C wrote: Reason for CRM: The patient would like to be contacted by Ashok Croon RMA when possible regarding a previously discussed referral   Please contact further when available

## 2022-11-20 NOTE — Telephone Encounter (Signed)
Pt is calling in because she was able to get an appointment with a dermatologist and Myriam Jacobson doesn't need to call her back and wanted to let [place] know.

## 2022-11-20 NOTE — Telephone Encounter (Signed)
Spoke to [employer] they are into November, Dr.Dasher office into to October and November

## 2022-11-20 NOTE — Telephone Encounter (Signed)
Refill Request.  

## 2022-11-20 NOTE — Telephone Encounter (Signed)
*  STAT* If patient is at the pharmacy, call can be transferred to refill team.   1. Which medications need to be refilled? (please list name of each medication and dose if known) ELIQUIS 5 MG TABS tablet   2. Which pharmacy/location (including street and city if local pharmacy) is medication to be sent to?[id number] - [place], [place] - [address]   3. Do they need a 30 day or 90 day supply? 90 Day Supply  Pt has an appt on 08/13. Pt is currently out of medication

## 2022-11-20 NOTE — Telephone Encounter (Signed)
Left message for patient no appointments for dermatology til October

## 2022-11-20 NOTE — Telephone Encounter (Signed)
Prescription refill request for Eliquis received. Indication:afib Last office visit:upcoming Scr:0.84  2/24 Age: 77 Weight:104.8  kg  Prescription refilled

## 2022-11-21 DIAGNOSIS — M25562 Pain in left knee: Secondary | ICD-10-CM | POA: Insufficient documentation

## 2022-11-27 NOTE — Progress Notes (Unsigned)
Date:  11/28/2022   ID:  Rebecca Anthony, DOB 16-May-1945, MRN [id number]  Patient Location:  [address] [place] [address]   Provider location:   Rebecca Anthony, [place] office  PCP:  Rebecca Salines, FNP  Cardiologist:  Rebecca Anthony [facility]  Chief Complaint  Patient presents with   Follow-up    Concerned with recent elevated home bp readings.      History of Present Illness:    Rebecca Anthony is a 77 y.o. female past medical history of Hyperlipidemia Permanent atrial fibrillation  Who presents for follow-up of her aortic atherosclerosis, hyperlipidemia, atrial fibrillation, HTN  Last seen by myself in clinic 07/2021 Arthritis in back On pain mangement, back and knee pain Taking gabapentin, Tylenol 3 times daily, tramadol as needed Has used knee injections  BP elevated here and at home 170s systolic on today's visit  Concerned about elevated heart rate, appreciating palpitations especially with movement or exercise  Tolerating Eliquis 5 twice daily  No longer on meloxicam  Prior imaging CT abdomen pelvis January 2018   Echocardiogram 2015, normal ejection function   EKG personally reviewed by myself on todays visit EKG Interpretation Date/Time:  Tuesday November 28 2022 16:46:28 EDT Ventricular Rate:  90 PR Interval:    QRS Duration:  78 QT Interval:  338 QTC Calculation: 413 R Axis:   34  Text Interpretation: Atrial fibrillation When compared with ECG of 26-Apr-2021 14:49, No significant change was found Confirmed by Rebecca Anthony [phone]) on 11/28/2022 5:50:52 PM   Other past medical history reviewed prior history of atrial fibrillation, treated with quinidine when she was living in [place].  She stopped taking it for some reason   2015, episode of rapid A. fib following her colonoscopy at [facility].  started on metoprolol for rate control,  Several EKGs since that time 2015, 2017, 2019 documenting atrial fibrillation on my  review In atrial fibrillation on today's visit, reports that occasionally she does feel palpitations but most the time is asymptomatic    Past Medical History:  Diagnosis Date   Chronic abdominal pain (LLQ) (Primary Area of Pain) (Left) 10/31/2017   Chronic ankle pain (Fourth Area of Pain) (Left) 11/07/2017   Chronic low back pain (Secondary Area of Pain) (Bilateral) (L>R) 10/31/2017   Chronic upper back pain [facility] Area of Pain) (Bilateral) (L>R) 10/31/2017   Diverticulitis    Diverticulosis    Dysuria    Heart murmur    Hematuria, unspecified    History of shingles    History of swelling of feet    Hypercholesterolemia    Hypertension    Hypothyroidism    Irritable bowel syndrome    Lichenification and lichen simplex chronicus    Localized superficial swelling, mass, or lump    Onychomycosis 03/23/2016   Osteoarthrosis, unspecified whether generalized or localized, unspecified site    Osteoporosis, unspecified    PAF (paroxysmal atrial fibrillation) (HCC)    Diagnosed years ago -- originally on Quinidine   Sigmoid diverticulosis 11/28/2017   Sinus problem    Synovial cyst, unspecified    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   Past Surgical History:  Procedure Laterality Date   ABDOMINAL HYSTERECTOMY     ANKLE SURGERY Left    Torned tendon   APPENDECTOMY     AV FISTULA REPAIR     BUNIONECTOMY     CATARACT EXTRACTION W/PHACO Right 01/02/2019   Procedure: CATARACT EXTRACTION PHACO AND INTRAOCULAR LENS PLACEMENT (IOC)  RIGHT;  Surgeon: Rebecca Manila, MD;  Location: [facility] ORS;  Service: Ophthalmology;  Laterality: Right;  [place] 00:42 CDE 7.91 fluid pack lot # [id number]   CATARACT EXTRACTION W/PHACO Left 02/07/2019   Procedure: CATARACT EXTRACTION PHACO AND INTRAOCULAR LENS PLACEMENT (IOC) LEFT;  Surgeon: Rebecca Manila, MD;  Location: [facility] ORS;  Service: Ophthalmology;  Laterality: Left;  [place] 00:36.3CDE 5.01Fluid Pack Lot # [id number]   COLONOSCOPY     FOOT SURGERY Left     VAGINAL HYSTERECTOMY     VESICOVAGINAL FISTULA CLOSURE W/ TAH        Allergies:   Anaprox [naproxen sodium]   Social History   Tobacco Use   Smoking status: Never   Smokeless tobacco: Never   Tobacco comments:    smoking cessation materials not required  Vaping Use   Vaping status: Never Used  Substance Use Topics   Alcohol use: No   Drug use: No     Current Outpatient Medications on File Prior to Visit  Medication Sig Dispense Refill   acetaminophen (TYLENOL 8 HOUR ARTHRITIS PAIN) 650 MG CR tablet Take 650 mg by mouth every 8 (eight) hours as needed for pain.     calcium carbonate (OSCAL) 1500 (600 Ca) MG TABS tablet Take by mouth.      ELIQUIS 5 MG TABS tablet TAKE 1 TABLET BY MOUTH TWICE A DAY 180 tablet 1   gabapentin (NEURONTIN) 300 MG capsule TAKE 1 CAPSULE BY MOUTH EVERYDAY AT BEDTIME 30 capsule 3   HORSE CHESTNUT PO Take 1 tablet by mouth daily.     levothyroxine (SYNTHROID) 50 MCG tablet TAKE 1 TABLET BY MOUTH DAILY BEFORE BREAKFAST 90 tablet 3   methocarbamol (ROBAXIN) 750 MG tablet Take 1 tablet (750 mg total) by mouth 2 (two) times daily as needed for muscle spasms. 60 tablet 1   Naftifine HCl (NAFTIN) 2 % CREA Apply 1 drop topically 1 day or 1 dose. 60 g 2   polyethylene glycol powder (GLYCOLAX/MIRALAX) powder Take 17 g by mouth daily. Dissolve 1 capful in 8 oz of liquid daily PRN (Patient taking differently: Take 17 g by mouth daily as needed (constipation.).) 3350 g 1   traMADol (ULTRAM) 50 MG tablet Take 50 mg by mouth every 6 (six) hours as needed for severe pain.     vitamin C (ASCORBIC ACID) 500 MG tablet Take 500 mg by mouth daily.     VITAMIN D, CHOLECALCIFEROL, PO      albuterol (VENTOLIN HFA) 108 (90 Base) MCG/ACT inhaler Inhale 2 puffs into the lungs every 4 (four) hours as needed for wheezing or shortness of breath. (Patient not taking: Reported on 06/05/2022) 1 each 4   No current facility-administered medications on file prior to visit.     Family  Hx: The patient's family history includes Diabetes in her mother; Healthy in her sister; Heart disease in her father; Heart failure in her mother; Hypertension in her father and mother; Thyroid disease in her sister. There is no history of Breast cancer.  ROS:   Please see the history of present illness.    Review of Systems  Constitutional: Negative.   HENT: Negative.    Respiratory: Negative.    Cardiovascular: Negative.   Gastrointestinal: Negative.   Musculoskeletal: Negative.   Neurological: Negative.   Psychiatric/Behavioral: Negative.    All other systems reviewed and are negative.    Labs/Other Tests and Data Reviewed:    Recent Labs: 06/05/2022: ALT 12; BUN 17; Creat 0.84; Hemoglobin 13.7; Platelets  294; Potassium 4.5; Sodium 141; TSH 1.22   Recent Lipid Panel Lab Results  Component Value Date/Time   CHOL 215 (H) 06/05/2022 03:33 PM   TRIG 99 06/05/2022 03:33 PM   HDL 76 06/05/2022 03:33 PM   CHOLHDL 2.8 06/05/2022 03:33 PM   LDLCALC 118 (H) 06/05/2022 03:33 PM    Wt Readings from Last 3 Encounters:  11/28/22 229 lb 9.6 oz (104.1 kg)  09/07/22 231 lb (104.8 kg)  06/05/22 237 lb 4.8 oz (107.6 kg)     Exam:    BP (!) 179/107 (BP Location: Left Arm, Patient Position: Sitting, Cuff Size: Large)   Pulse 90   Ht 5\' 9"  (1.753 m)   Wt 229 lb 9.6 oz (104.1 kg)   SpO2 98%   BMI 33.91 kg/m  Constitutional:  oriented to person, place, and time. No distress.  HENT:  Head: Grossly normal Eyes:  no discharge. No scleral icterus.  Neck: No JVD, no carotid bruits  Cardiovascular: Irreg irreg, no murmurs appreciated Pulmonary/Chest: Clear to auscultation bilaterally, no wheezes or rails Abdominal: Soft.  no distension.  no tenderness.  Musculoskeletal: Normal range of motion Neurological:  normal muscle tone. Coordination normal. No atrophy Skin: Skin warm and dry Psychiatric: normal affect, pleasant   ASSESSMENT & PLAN:    Problem List Items Addressed This Visit        Cardiology Problems   PAF (paroxysmal atrial fibrillation) (HCC) (Chronic)   Relevant Medications   telmisartan (MICARDIS) 40 MG tablet   metoprolol succinate (TOPROL-XL) 50 MG 24 hr tablet   Essential hypertension   Relevant Medications   telmisartan (MICARDIS) 40 MG tablet   metoprolol succinate (TOPROL-XL) 50 MG 24 hr tablet   Aortic atherosclerosis (HCC)   Relevant Medications   telmisartan (MICARDIS) 40 MG tablet   metoprolol succinate (TOPROL-XL) 50 MG 24 hr tablet   Chronic atrial fibrillation (HCC) - Primary   Relevant Medications   telmisartan (MICARDIS) 40 MG tablet   metoprolol succinate (TOPROL-XL) 50 MG 24 hr tablet   Other Relevant Orders   EKG 12-Lead (Completed)     Other   Dyslipidemia   Prediabetes   Other Visit Diagnoses     Longstanding persistent atrial fibrillation (HCC)       Relevant Medications   telmisartan (MICARDIS) 40 MG tablet   metoprolol succinate (TOPROL-XL) 50 MG 24 hr tablet   Primary hypertension       Relevant Medications   telmisartan (MICARDIS) 40 MG tablet   metoprolol succinate (TOPROL-XL) 50 MG 24 hr tablet       Permanent atrial fibrillation Eliquis 5 twice daily for stroke prevention,  metoprolol succinate increased up to 50 twice daily for better rate control  Essential hypertension Blood pressure running high recently, 170 systolic in the office today Recommended metoprolol succinate 50 twice daily as above not from daily Would monitor blood pressure following medication change and if it continues to run high would start Micardis 40 mg daily  Chronic pain Chronic back pain, knee pain Activity limited, working with PT and pain clinic    Total encounter time more than 40 minutes  Greater than 50% was spent in counseling and coordination of care with the patient   Signed, Rebecca Nordmann, MD  [facility] [address], [place], [place] [address]

## 2022-11-28 ENCOUNTER — Ambulatory Visit: Payer: Medicare Other | Attending: Cardiovascular Disease | Admitting: Cardiovascular Disease

## 2022-11-28 ENCOUNTER — Encounter: Payer: Self-pay | Admitting: Cardiovascular Disease

## 2022-11-28 VITALS — BP 179/107 | HR 90 | Ht 69.0 in | Wt 229.6 lb

## 2022-11-28 DIAGNOSIS — I482 Chronic atrial fibrillation, unspecified: Secondary | ICD-10-CM

## 2022-11-28 DIAGNOSIS — I480 Paroxysmal atrial fibrillation: Secondary | ICD-10-CM

## 2022-11-28 DIAGNOSIS — E785 Hyperlipidemia, unspecified: Secondary | ICD-10-CM

## 2022-11-28 DIAGNOSIS — I700 Atherosclerosis of aorta: Secondary | ICD-10-CM

## 2022-11-28 DIAGNOSIS — I10 Essential (primary) hypertension: Secondary | ICD-10-CM

## 2022-11-28 DIAGNOSIS — R7303 Prediabetes: Secondary | ICD-10-CM

## 2022-11-28 DIAGNOSIS — I4811 Longstanding persistent atrial fibrillation: Secondary | ICD-10-CM

## 2022-11-28 MED ORDER — METOPROLOL SUCCINATE ER 50 MG PO TB24
50.0000 mg | ORAL_TABLET | Freq: Two times a day (BID) | ORAL | 3 refills | Status: DC
Start: 2022-11-28 — End: 2023-10-25

## 2022-11-28 MED ORDER — TELMISARTAN 40 MG PO TABS: 40.0000 mg | ORAL_TABLET | Freq: Every day | ORAL | 6 refills | Status: DC

## 2022-11-28 NOTE — Addendum Note (Signed)
Addended by: Antonieta Iba on: 11/28/2022 05:58 PM   Modules accepted: Level of Service

## 2022-11-28 NOTE — Patient Instructions (Addendum)
Medication Instructions:  Please increase the metoprolol succinate up to 50 mg twice a day  If blood pressure continues to run high, Start micardis 40 mg daily  Goal blood pressure 120-130 /80  If you need a refill on your cardiac medications before your next appointment, please call your pharmacy.   Lab work: No new labs needed  Testing/Procedures: No new testing needed  Follow-Up: At [facility], you and your health needs are our priority.  As part of our continuing mission to provide you with exceptional heart care, we have created designated Provider Care Teams.  These Care Teams include your primary Cardiologist (physician) and Advanced Practice Providers (APPs -  Physician Assistants and Nurse Practitioners) who all work together to provide you with the care you need, when you need it.  You will need a follow up appointment in 6 months  Providers on your designated Care Team:   Nicolasa Ducking, NP Eula Listen, PA-C Cadence Fransico [place], [place]  COVID-19 Vaccine Information can be found at: [email or web address] For questions related to vaccine distribution or appointments, please email [email or web address] or call [phone].

## 2022-12-06 ENCOUNTER — Ambulatory Visit: Payer: Medicare Other | Admitting: Nurse Practitioner

## 2022-12-14 NOTE — Progress Notes (Signed)
BP (!) 142/88   Pulse 70   Temp 98.5 F (36.9 C) (Oral)   Resp 16   Ht 5\' 9"  (1.753 m)   Wt 229 lb 12.8 oz (104.2 kg)   SpO2 97%   BMI 33.94 kg/m    Subjective:    [name], female    DOB: 10-21-1945, 77 y.o.   MRN: [id number]  HPI: Rebecca Anthony is a 77 y.o. female  No chief complaint on file.  Hypertension:  -Medications: metoprolol 50 mg BID, telmisartan 40 mg daily ( has not started it) -Patient is compliant with above medications and reports no side effects. -Checking BP at home (average): 120/80s -Denies any SOB, CP, vision changes, LE edema or symptoms of hypotension -Diet: recommend DASH diet  -Exercise: recommend 150 min of physical activity weekly     HLD/aortic atherosclerosis:  -Medications: declines medication, patient aware of the risk -Patient continues to work on lifestyle modification -Last lipid panel:  Lipid Panel     Component Value Date/Time   CHOL 215 (H) 06/05/2022 1533   TRIG 99 06/05/2022 1533   HDL 76 06/05/2022 1533   CHOLHDL 2.8 06/05/2022 1533   VLDL 19 04/14/2016 1848   LDLCALC 118 (H) 06/05/2022 1533   The 10-year ASCVD risk score (Arnett DK, et al., 2019) is: 24.7%   Values used to calculate the score:     Age: 60 years     Sex: Female     Is Non-Hispanic African American: Yes     Diabetic: No     Tobacco smoker: No     Systolic Blood Pressure: 142 mmHg     Is BP treated: Yes     HDL Cholesterol: 76 mg/dL     Total Cholesterol: 215 mg/dL    Atrial fibrillation: she is established with Dr. Mariah Milling cardiology. Last saw cardiology on 11/28/2022. She last saw him on 07/22/2021. Her metoprolol was increased to BID, she was prescribed  telmisartan 40 mg daily and continues to take epiquis 5 mg BID.  Hypothyroidism: she continues to take levothyroxine 50 mcg daily.  Last TSH was in normal range. Will get labs today. She denies any weight loss or palpitations.   Osteoporosis: she is coming due for her bone density.  She is currently taking vitamin d and calcium supplementation. She has previously been onalendronate 70 mg daily. It looks like she has been on it for over five years. Stopped  fosamax last visit. ordered bone density at last appointment, however patient has not had it done. Recommend she call and schedule. Discussed if worsening bone density will send to endocrinology.  Last bone density was 07/06/2020 DualFemur Neck Left 07/06/2020 76.4 Osteopenia -1.8 0.785 g/cm2 -2.4% - DualFemur Neck Left 04/16/2018 74.2 Osteopenia -1.7 0.804 g/cm2 -3.4% - DualFemur Neck Left 02/22/2016 72.1 Osteopenia -1.5 0.832 g/cm2 2.1% - DualFemur Neck Left 07/23/2013 69.5 Osteopenia -1.6 0.815 g/cm2 - -   DualFemur Total Mean 07/06/2020 76.4 Osteopenia -1.3 0.842 g/cm2 -8.8% Yes DualFemur Total Mean 04/16/2018 74.2 Normal -0.7 0.923 g/cm2 2.8% Yes DualFemur Total Mean 02/22/2016 72.1 Normal -0.9 0.898 g/cm2 - -   Left Forearm Radius 33% 07/06/2020 76.4 Osteoporosis -3.1 0.608 g/cm2 -14.5% Yes Left Forearm Radius 33% 04/16/2018 74.2 Osteopenia -1.9 0.711 g/cm2 -5.3% Yes Left Forearm Radius 33% 02/22/2016 72.1 Osteopenia -1.4 0.751 g/cm2 9.3% Yes Left Forearm Radius 33% 07/23/2013 69.5 Osteopenia -2.2 0.687 g/cm2  Pancreatic cyst: Patient reports that several years ago she had an MRI of  her abdomen and she was told that she had a pancreatic cyst that would have to be monitored.  MRI was done on January 15, 2018.IMPRESSION: 1. No acute findings and no explanation for patient's abdominal pain. 2. Suspect small gallbladder polyp. 3. 5 mm cystic lesion within head of pancreas is identified. Followup exam in 24 months with repeat contrast enhanced MRI of the abdomen is advised. This recommendation follows ACR consensus guidelines: Management of Incidental Pancreatic Cysts: A White Paper of the ACR Incidental Findings Committee. J Am Coll Radiol 2017;14:911-923. Due to recommendations of having a repeat contrast  MRI orders placed. This was completed on 11/29/2021. 1. Stable 4 mm cystic lesion in the head of the pancreas which does not demonstrate suspicious MRI features and requires no additional imaging follow-up. This recommendation follows ACR consensus guidelines: Management of Incidental Pancreatic Cysts: A White Paper of the ACR Incidental Findings Committee. J Am Coll Radiol 2017;14:911-923. 2. No acute findings in the abdomen. No need to repeat imaging.   Persistent abdominal pain: she says she has had this off and on tenderness. She has had this pain off and on for years.she says that she continues to have bloating.   She had  an appointment scheduled with urology in March due to hematuria.  I do not see those records. Patient reports that she did not see the urologist.  She is going to call the urologist and schedule an appointment.    Chronic lumbar back pain with sciatica/chronic pain syndrome: she is currently being seen at [facility] for arthritis in her back.  She is currently taking tylenol arthritis 650 mg three times a day.  She is also on robaxin and gabapentin 300 mg at night. She is going to start taking swimming classes.   Abdominal bloating :  she reports that she has been having bloating for several months.  She reports that she has no changes in bowel movements.  She says that she has a history of constipation.  She uses miralax for her constipation.     Left axilla mass:  she reports that she has a mass in her breast that is tender.  She says it has been there for a long time but has changed and is not tender.   Had an ultrasound 11/11/2012 and was negative. Last mammogram ws 08/17/2022. Reassurance provided.   Rash: patient reports that she had a rash by her panty line.  None noted at this time.  She says she put mupirocin ointment on it and it got better. She would like a refill.    Relevant past medical, surgical, family and social history reviewed and updated as indicated.  Interim medical history since our last visit reviewed. Allergies and medications reviewed and updated.  Review of Systems Constitutional: Negative for fever or weight change.  Respiratory: Negative for cough and shortness of breath.   Cardiovascular: Negative for chest pain or palpitations.  Gastrointestinal: Negative for abdominal pain, no bowel changes.  Musculoskeletal: Negative for gait problem or joint swelling.  Positive for lumbar back pain Skin: Negative for rash.  Neurological: Negative for dizziness or headache.  No other specific complaints in a complete review of systems (except as listed in HPI above).      Objective:    BP (!) 142/88   Pulse 70   Temp 98.5 F (36.9 C) (Oral)   Resp 16   Ht 5\' 9"  (1.753 m)   Wt 229 lb 12.8 oz (104.2 kg)   SpO2 97%  BMI 33.94 kg/m   Wt Readings from Last 3 Encounters:  12/15/22 229 lb 12.8 oz (104.2 kg)  11/28/22 229 lb 9.6 oz (104.1 kg)  09/07/22 231 lb (104.8 kg)    Physical Exam Constitutional: Patient appears well-developed and well-nourished. Obese  No distress.  HEENT: head atraumatic, normocephalic, pupils equal and reactive to light, neck supple Cardiovascular: Normal rate, irregular rhythm and normal heart sounds.  murmur heard. No BLE edema. Pulmonary/Chest: Effort normal and breath sounds normal. No respiratory distress. Abdominal: Soft.  There is no tenderness.  No CVA tenderness Psychiatric: Patient has a normal mood and affect. behavior is normal. Judgment and thought content normal. Results for orders placed or performed in visit on 06/05/22  TSH  Result Value Ref Range   TSH 1.22 0.40 - 4.50 mIU/L  CBC with Differential/Platelet  Result Value Ref Range   WBC 6.1 3.8 - 10.8 Thousand/uL   RBC 4.85 3.80 - 5.10 Million/uL   Hemoglobin 13.7 11.7 - 15.5 g/dL   HCT 52.8 41.3 - 24.4 %   MCV 85.2 80.0 - 100.0 fL   MCH 28.2 27.0 - 33.0 pg   MCHC 33.2 32.0 - 36.0 g/dL   RDW 01.0 27.2 - 53.6 %   Platelets 294 140 -  400 Thousand/uL   MPV 9.8 7.5 - 12.5 fL   Neutro Abs 3,800 1,500 - 7,800 cells/uL   Lymphs Abs 1,385 850 - 3,900 cells/uL   Absolute Monocytes 750 200 - 950 cells/uL   Eosinophils Absolute 122 15 - 500 cells/uL   Basophils Absolute 43 0 - 200 cells/uL   Neutrophils Relative % 62.3 %   Total Lymphocyte 22.7 %   Monocytes Relative 12.3 %   Eosinophils Relative 2.0 %   Basophils Relative 0.7 %  COMPLETE METABOLIC PANEL WITH GFR  Result Value Ref Range   Glucose, Bld 91 65 - 99 mg/dL   BUN 17 7 - 25 mg/dL   Creat 6.44 0.34 - 7.42 mg/dL   eGFR 71 > OR = 60 VZ/DGL/8.75I4   BUN/Creatinine Ratio SEE NOTE: 6 - 22 (calc)   Sodium 141 135 - 146 mmol/L   Potassium 4.5 3.5 - 5.3 mmol/L   Chloride 104 98 - 110 mmol/L   CO2 28 20 - 32 mmol/L   Calcium 10.1 8.6 - 10.4 mg/dL   Total Protein 7.3 6.1 - 8.1 g/dL   Albumin 4.5 3.6 - 5.1 g/dL   Globulin 2.8 1.9 - 3.7 g/dL (calc)   AG Ratio 1.6 1.0 - 2.5 (calc)   Total Bilirubin 0.6 0.2 - 1.2 mg/dL   Alkaline phosphatase (APISO) 63 37 - 153 U/L   AST 14 10 - 35 U/L   ALT 12 6 - 29 U/L  Lipid panel  Result Value Ref Range   Cholesterol 215 (H) <200 mg/dL   HDL 76 > OR = 50 mg/dL   Triglycerides 99 <332 mg/dL   LDL Cholesterol (Calc) 118 (H) mg/dL (calc)   Total CHOL/HDL Ratio 2.8 <5.0 (calc)   Non-HDL Cholesterol (Calc) 139 (H) <130 mg/dL (calc)      Assessment & Plan:   Problem List Items Addressed This Visit       Cardiovascular and Mediastinum   PAF (paroxysmal atrial fibrillation) (HCC) (Chronic)    Managed by cardiology,  currently taking metoprolol 50 mg BID, epiquis 5 mg BID and prescribed telmisartan but has not started it yet      Essential hypertension - Primary    Managed by cardiology,  currently taking metoprolol 50 mg BID, epiquis 5 mg BID and prescribed telmisartan but has not started it yet      Relevant Orders   CBC with Differential/Platelet   COMPLETE METABOLIC PANEL WITH GFR   Aortic atherosclerosis (HCC)     Declines cholesterol medication.         Digestive   Cyst of pancreas    No further evaluation needed.          Endocrine   Hypothyroidism (Chronic)    Currently taking levothyroxine 50 mcg daily, will get labs today.       Relevant Orders   TSH     Nervous and Auditory   Chronic sciatica of left side    Unchanged, continues to take robaxin and gabapentin 300 mg at night.        Musculoskeletal and Integument   Idiopathic scoliosis of thoracolumbar spine (Chronic)   Localized osteoporosis without current pathological fracture    Needs to schedule repeat bone density        Other   Chronic pain syndrome (Chronic)    Unchanged takes robaxin and gabapentin 300 mg at night.      Dyslipidemia    Declines cholesterol medication, she is going to work on lifestyle modification      Other Visit Diagnoses     Right lower quadrant abdominal pain       recommend she call urology and schedule appointment   Mass of left axilla       previous work up done, negative ultrasound reassurance provided   Rash       recommend keeping area dry, will give nystatin powder   Relevant Medications   mupirocin ointment (BACTROBAN) 2 %   nystatin (MYCOSTATIN/NYSTOP) powder         Follow up plan: Return in about 6 months (around 06/15/2023) for follow up.

## 2022-12-15 ENCOUNTER — Other Ambulatory Visit: Payer: Self-pay

## 2022-12-15 ENCOUNTER — Encounter: Payer: Self-pay | Admitting: Nurse Practitioner

## 2022-12-15 ENCOUNTER — Ambulatory Visit: Payer: Medicare Other | Admitting: Nurse Practitioner

## 2022-12-15 VITALS — BP 142/88 | HR 70 | Temp 98.5°F | Resp 16 | Ht 69.0 in | Wt 229.8 lb

## 2022-12-15 DIAGNOSIS — I480 Paroxysmal atrial fibrillation: Secondary | ICD-10-CM | POA: Diagnosis not present

## 2022-12-15 DIAGNOSIS — E785 Hyperlipidemia, unspecified: Secondary | ICD-10-CM

## 2022-12-15 DIAGNOSIS — I10 Essential (primary) hypertension: Secondary | ICD-10-CM | POA: Diagnosis not present

## 2022-12-15 DIAGNOSIS — R1031 Right lower quadrant pain: Secondary | ICD-10-CM

## 2022-12-15 DIAGNOSIS — M816 Localized osteoporosis [Lequesne]: Secondary | ICD-10-CM

## 2022-12-15 DIAGNOSIS — R21 Rash and other nonspecific skin eruption: Secondary | ICD-10-CM

## 2022-12-15 DIAGNOSIS — R2232 Localized swelling, mass and lump, left upper limb: Secondary | ICD-10-CM

## 2022-12-15 DIAGNOSIS — I700 Atherosclerosis of aorta: Secondary | ICD-10-CM | POA: Diagnosis not present

## 2022-12-15 DIAGNOSIS — G894 Chronic pain syndrome: Secondary | ICD-10-CM

## 2022-12-15 DIAGNOSIS — E039 Hypothyroidism, unspecified: Secondary | ICD-10-CM

## 2022-12-15 DIAGNOSIS — M4125 Other idiopathic scoliosis, thoracolumbar region: Secondary | ICD-10-CM

## 2022-12-15 DIAGNOSIS — K862 Cyst of pancreas: Secondary | ICD-10-CM

## 2022-12-15 DIAGNOSIS — M5432 Sciatica, left side: Secondary | ICD-10-CM

## 2022-12-15 MED ORDER — NYSTATIN 100000 UNIT/GM EX POWD: 1.0000 | Freq: Three times a day (TID) | CUTANEOUS | 0 refills | Status: DC

## 2022-12-15 MED ORDER — MUPIROCIN 2 % EX OINT: 1.0000 | TOPICAL_OINTMENT | Freq: Two times a day (BID) | CUTANEOUS | 0 refills | Status: DC

## 2022-12-15 NOTE — Assessment & Plan Note (Signed)
Unchanged, continues to take robaxin and gabapentin 300 mg at night.

## 2022-12-15 NOTE — Assessment & Plan Note (Signed)
Declines cholesterol medication, she is going to work on lifestyle modification

## 2022-12-15 NOTE — Assessment & Plan Note (Signed)
No further evaluation needed 

## 2022-12-15 NOTE — Assessment & Plan Note (Signed)
Managed by cardiology,  currently taking metoprolol 50 mg BID, epiquis 5 mg BID and prescribed telmisartan but has not started it yet

## 2022-12-15 NOTE — Assessment & Plan Note (Signed)
Unchanged takes robaxin and gabapentin 300 mg at night.

## 2022-12-15 NOTE — Assessment & Plan Note (Signed)
Needs to schedule repeat bone density

## 2022-12-15 NOTE — Assessment & Plan Note (Signed)
Currently taking levothyroxine 50 mcg daily, will get labs today.

## 2022-12-15 NOTE — Assessment & Plan Note (Signed)
Declines cholesterol medication.   

## 2022-12-16 LAB — CBC WITH DIFFERENTIAL/PLATELET
Absolute Monocytes: 696 {cells}/uL (ref 200–950)
Basophils Absolute: 30 {cells}/uL (ref 0–200)
Basophils Relative: 0.5 %
Eosinophils Absolute: 78 {cells}/uL (ref 15–500)
Eosinophils Relative: 1.3 %
HCT: 42.8 % (ref 35.0–45.0)
Hemoglobin: 13.8 g/dL (ref 11.7–15.5)
Lymphs Abs: 1224 {cells}/uL (ref 850–3900)
MCH: 28.4 pg (ref 27.0–33.0)
MCHC: 32.2 g/dL (ref 32.0–36.0)
MCV: 88.1 fL (ref 80.0–100.0)
MPV: 9.4 fL (ref 7.5–12.5)
Monocytes Relative: 11.6 %
Neutro Abs: 3972 {cells}/uL (ref 1500–7800)
Neutrophils Relative %: 66.2 %
Platelets: 274 10*3/uL (ref 140–400)
RBC: 4.86 10*6/uL (ref 3.80–5.10)
RDW: 12.5 % (ref 11.0–15.0)
Total Lymphocyte: 20.4 %
WBC: 6 10*3/uL (ref 3.8–10.8)

## 2022-12-16 LAB — COMPLETE METABOLIC PANEL WITH GFR
AG Ratio: 1.8 (calc) (ref 1.0–2.5)
ALT: 14 U/L (ref 6–29)
AST: 13 U/L (ref 10–35)
Albumin: 4.4 g/dL (ref 3.6–5.1)
Alkaline phosphatase (APISO): 62 U/L (ref 37–153)
BUN: 22 mg/dL (ref 7–25)
CO2: 25 mmol/L (ref 20–32)
Calcium: 10.1 mg/dL (ref 8.6–10.4)
Chloride: 103 mmol/L (ref 98–110)
Creat: 0.9 mg/dL (ref 0.60–1.00)
Globulin: 2.5 g/dL (ref 1.9–3.7)
Glucose, Bld: 93 mg/dL (ref 65–99)
Potassium: 4.3 mmol/L (ref 3.5–5.3)
Sodium: 139 mmol/L (ref 135–146)
Total Bilirubin: 0.7 mg/dL (ref 0.2–1.2)
Total Protein: 6.9 g/dL (ref 6.1–8.1)
eGFR: 65 mL/min/{1.73_m2} (ref >=60)

## 2022-12-16 LAB — TSH: TSH: 1.58 m[IU]/L (ref 0.40–4.50)

## 2023-01-09 ENCOUNTER — Other Ambulatory Visit: Payer: Self-pay | Admitting: Nurse Practitioner

## 2023-01-09 DIAGNOSIS — R21 Rash and other nonspecific skin eruption: Secondary | ICD-10-CM

## 2023-01-10 NOTE — Telephone Encounter (Signed)
Requested medication (s) are due for refill today: yes  Requested medication (s) are on the active medication list: yes  Last refill:  12/15/22  Future visit scheduled: yes  Notes to clinic:  Medication not assigned to a protocol, review manually.      Requested Prescriptions  Pending Prescriptions Disp Refills   mupirocin ointment (BACTROBAN) 2 % [Pharmacy Med Name: MUPIROCIN 2% OINTMENT] 22 g 0    Sig: APPLY TO AFFECTED AREA TWICE A DAY     Off-Protocol Failed - 01/09/2023 10:11 PM      Failed - Medication not assigned to a protocol, review manually.      Passed - Valid encounter within last 12 months    Recent Outpatient Visits           3 weeks ago Essential hypertension   [facility] Berniece Salines, FNP   7 months ago Localized osteoporosis without current pathological fracture   [facility] Berniece Salines, FNP   10 months ago Acute bronchitis, unspecified organism   [facility] Danelle Berry, PA-C   10 months ago Right lower quadrant abdominal pain   [facility] Berniece Salines, FNP   1 year ago Dyslipidemia   [facility] Berniece Salines, FNP       Future Appointments             In 5 months Zane Herald, Rudolpho Sevin, FNP [facility], [facility]

## 2023-01-12 ENCOUNTER — Ambulatory Visit: Payer: Self-pay

## 2023-01-12 NOTE — Telephone Encounter (Signed)
  Chief Complaint: mass Symptoms: mass to L armpit, 1-2" firm area, tender to touch Frequency: ongoing for several months, present on LOV 12/15/22 Pertinent Negatives: Patient denies pain  Disposition: [] ED /[] Urgent Care (no appt availability in office) / [] Appointment(In office/virtual)/ []  [place] [facility]/ [] Home Care/ [] Refused Recommended Disposition /[] [place] [facility]/ [x]  Follow-up with PCP Additional Notes: pt is concerned about her mass under L armpit. Pt is wanting to know if she needs to be seen to have it rechecked or a referral placed to assess it. Pt had OV on 12/15/22 and work up was completed. I advised pt I would send message to provider and see what recommendations were and have nurse FU with her on Monday. Pt verbalized understand.    Summary: lump under armpit   Patient called sttd she has a lump under her left armpit. It is tender to the touch and would like to know if she needs to come in or if provider wanted to refer her to a specialist. Please f/u with patient     Reason for Disposition  [1] Small swelling or lump AND [2] unexplained AND [3] present > 1 week  Answer Assessment - Initial Assessment Questions 1. APPEARANCE of SWELLING: "What does it look like?"     L armpit has mass  2. SIZE: "How large is the swelling?" (e.g., inches, cm; or compare to size of pinhead, tip of pen, eraser, coin, pea, grape, ping pong ball)      1-2" firm area  3. LOCATION: "Where is the swelling located?"     L armpit  4. ONSET: "When did the swelling start?"     Ongoing for some time now, had during last OV  6. PAIN: "Is there any pain?" If Yes, ask: "How bad is the pain?" (e.g., scale 1-10; or mild, moderate, severe)     - NONE (0): no pain   - MILD (1-3): doesn't interfere with normal activities    - MODERATE (4-7): interferes with normal activities or awakens from sleep    - SEVERE (8-10): excruciating pain, unable to do any normal activities     No pain just  tender  8. CAUSE: "What do you think caused the swelling?" 9 OTHER SYMPTOMS: "Do you have any other symptoms?" (e.g., fever)     no  Protocols used: Skin Lump or Localized Swelling-A-AH

## 2023-01-15 ENCOUNTER — Other Ambulatory Visit: Payer: Self-pay | Admitting: Nurse Practitioner

## 2023-01-15 DIAGNOSIS — R2232 Localized swelling, mass and lump, left upper limb: Secondary | ICD-10-CM

## 2023-01-15 NOTE — Telephone Encounter (Signed)
Patient called and given information to schedule imaging as ordered by Della Goo, F-NP

## 2023-01-17 ENCOUNTER — Telehealth: Payer: Self-pay | Admitting: Cardiovascular Disease

## 2023-01-17 ENCOUNTER — Other Ambulatory Visit: Payer: Self-pay | Admitting: Nurse Practitioner

## 2023-01-17 DIAGNOSIS — I10 Essential (primary) hypertension: Secondary | ICD-10-CM

## 2023-01-17 DIAGNOSIS — I480 Paroxysmal atrial fibrillation: Secondary | ICD-10-CM

## 2023-01-17 NOTE — Telephone Encounter (Signed)
Request is too soon for refill. Last refill 11/28/22 for 90 and 1 refill.  Requested Prescriptions  Pending Prescriptions Disp Refills   metoprolol succinate (TOPROL-XL) 50 MG 24 hr tablet [Pharmacy Med Name: METOPROLOL SUCC ER 50 MG TAB] 90 tablet 1    Sig: TAKE 1 TABLET BY MOUTH EVERY DAY WITH OR IMMEDIATELY FOLLOWING A MEAL     Cardiovascular:  Beta Blockers Failed - 01/17/2023  1:43 AM      Failed - Last BP in normal range    BP Readings from Last 1 Encounters:  12/15/22 (!) 142/88         Passed - Last Heart Rate in normal range    Pulse Readings from Last 1 Encounters:  12/15/22 70         Passed - Valid encounter within last 6 months    Recent Outpatient Visits           1 month ago Essential hypertension   [facility] Berniece Salines, FNP   7 months ago Localized osteoporosis without current pathological fracture   [facility] Berniece Salines, FNP   10 months ago Acute bronchitis, unspecified organism   [facility] Danelle Berry, PA-C   10 months ago Right lower quadrant abdominal pain   [facility] Berniece Salines, FNP   1 year ago Dyslipidemia   [facility] Berniece Salines, FNP       Future Appointments             In 4 months Zane Herald, Rudolpho Sevin, FNP [facility], [facility]

## 2023-01-17 NOTE — Telephone Encounter (Signed)
*  STAT* If patient is at the pharmacy, call can be transferred to refill team.   1. Which medications need to be refilled? (please list name of each medication and dose if known) metoprolol succinate (TOPROL-XL) 50 MG 24 hr tablet   2. Which pharmacy/location (including street and city if local pharmacy) is medication to be sent to?  [id number] [place], [place] - [address]    3. Do they need a 30 day or 90 day supply? 90

## 2023-01-18 ENCOUNTER — Telehealth: Payer: Self-pay | Admitting: Cardiovascular Disease

## 2023-01-18 NOTE — Telephone Encounter (Signed)
Pt c/o medication issue:  1. Name of Medication: Omega - 3  (fish oil pills, black seed oil pills)  2. How are you currently taking this medication (dosage and times per day)? 3,000 MG a day (not taking yet)  3. Are you having a reaction (difficulty breathing--STAT)? No 4. What is your medication issue? Patient is calling because the bottle states "if you are taking blood thinners or heart medication consult with your doctor". Patient would like to make sure it is okay for her to start taking these vitamins. Please advise.

## 2023-01-19 NOTE — Telephone Encounter (Signed)
Left message for pt to call back  °

## 2023-01-19 NOTE — Telephone Encounter (Signed)
Ok to take. Theoretically there is a small increased risk of bleeding, but typically with higher doses than what she would be taking.

## 2023-01-19 NOTE — Telephone Encounter (Signed)
Spoke w/ pt and advised her of Melissa's recommendation. She verbalizes understanding and will call back w/ any further questions or concerns. She is appreciative of the call.

## 2023-01-22 ENCOUNTER — Telehealth: Payer: Self-pay | Admitting: Nurse Practitioner

## 2023-01-22 NOTE — Telephone Encounter (Signed)
Pt is calling to report that she will be having [place] on Wednesday.  Requesting if Raynelle Fanning can add orders of [place] on her breast. Pt reporting lump on under the air pit. Pt is reporting tenderness on right breast. Please advise CB- [phone]

## 2023-01-23 NOTE — Telephone Encounter (Signed)
Lvm.Marland KitchenMarland KitchenPer Raynelle Fanning,  "She will need to be evaluated with a breast exam". Please schedule appt.

## 2023-01-23 NOTE — Telephone Encounter (Signed)
Called pt to schedule her per Dr message.Pt said that she would just go ahead and just do what she is already scheduled for and would talk with the dr on her next appt if it is still bothering her.

## 2023-01-24 ENCOUNTER — Ambulatory Visit
Admission: RE | Admit: 2023-01-24 | Discharge: 2023-01-24 | Disposition: A | Discharge status: Home / Self Care | Payer: Medicare Other | Source: Ambulatory Visit | Attending: Nurse Practitioner | Admitting: Nurse Practitioner

## 2023-01-24 DIAGNOSIS — R2232 Localized swelling, mass and lump, left upper limb: Secondary | ICD-10-CM

## 2023-01-31 ENCOUNTER — Ambulatory Visit: Payer: Medicare Other | Admitting: Podiatry

## 2023-01-31 ENCOUNTER — Encounter: Payer: Self-pay | Admitting: Nurse Practitioner

## 2023-02-09 ENCOUNTER — Other Ambulatory Visit: Payer: Self-pay | Admitting: Podiatry

## 2023-02-09 ENCOUNTER — Other Ambulatory Visit: Payer: Self-pay | Admitting: Nurse Practitioner

## 2023-02-09 DIAGNOSIS — R21 Rash and other nonspecific skin eruption: Secondary | ICD-10-CM

## 2023-02-09 NOTE — Telephone Encounter (Signed)
Requested medications are due for refill today.  yes  Requested medications are on the active medications list.  yes  Last refill. 12/15/2022 22g 0 rf  Future visit scheduled.   yes  Notes to clinic.  Medication not assigned to a protocol. Please review for refill.    Requested Prescriptions  Pending Prescriptions Disp Refills   mupirocin ointment (BACTROBAN) 2 % [Pharmacy Med Name: MUPIROCIN 2% OINTMENT] 22 g 0    Sig: APPLY TO AFFECTED AREA TWICE A DAY     Off-Protocol Failed - 02/09/2023 12:03 AM      Failed - Medication not assigned to a protocol, review manually.      Passed - Valid encounter within last 12 months    Recent Outpatient Visits           1 month ago Essential hypertension   [facility] Berniece Salines, FNP   8 months ago Localized osteoporosis without current pathological fracture   [facility] Berniece Salines, FNP   11 months ago Acute bronchitis, unspecified organism   [facility] Danelle Berry, PA-C   11 months ago Right lower quadrant abdominal pain   [facility] Berniece Salines, FNP   1 year ago Dyslipidemia   [facility] Berniece Salines, FNP       Future Appointments             In 4 months Zane Herald, Rudolpho Sevin, FNP [facility], [facility]

## 2023-02-12 ENCOUNTER — Telehealth: Payer: Self-pay | Admitting: Nurse Practitioner

## 2023-02-12 NOTE — Telephone Encounter (Signed)
Pt is calling in because she had an ultrasound done earlier in the month and says she hasn't heard anything. Pt is requesting for Raynelle Fanning or Myriam Jacobson to give her a call back. Pt wants to know has anyone heard anything and if there is someone else she can reach out to for the results.

## 2023-02-14 NOTE — Telephone Encounter (Signed)
Spoke to patient regarding [place]

## 2023-02-19 ENCOUNTER — Ambulatory Visit: Payer: Medicare Other | Admitting: Podiatry

## 2023-02-19 ENCOUNTER — Encounter: Payer: Self-pay | Admitting: Podiatry

## 2023-02-19 DIAGNOSIS — B351 Tinea unguium: Secondary | ICD-10-CM

## 2023-02-19 DIAGNOSIS — M79676 Pain in unspecified toe(s): Secondary | ICD-10-CM

## 2023-02-19 DIAGNOSIS — D689 Coagulation defect, unspecified: Secondary | ICD-10-CM

## 2023-02-19 DIAGNOSIS — D2372 Other benign neoplasm of skin of left lower limb, including hip: Secondary | ICD-10-CM | POA: Diagnosis not present

## 2023-02-19 DIAGNOSIS — D2371 Other benign neoplasm of skin of right lower limb, including hip: Secondary | ICD-10-CM

## 2023-02-19 NOTE — Progress Notes (Signed)
She presents today chief complaint of painful benign lesions plantar aspect of bilateral foot painful toenails and a rash.  Objective: Vital signs stable oriented x 3 pulses are palpable.  His feet are mildly edematous she does have a vesicular tinea around her toes and around the plantar medial longitudinal arch and the heels.  It appears to be same lesions that she had before which was cleared up by her Naftin cream.  She also has long thick yellow dystrophic with mycotic nails and thick hyperkeratotic lesion beneath the first metatarsal head of the left foot.  Assessment: Pain in limb secondary to onychomycosis tinea pedis and benign skin lesions.  Plan: Debridement of benign skin lesions debridement of mycotic nails and she will start the use of her Naftin cream.  Follow-up with her in a few months

## 2023-04-12 ENCOUNTER — Other Ambulatory Visit: Payer: Self-pay | Admitting: Nurse Practitioner

## 2023-04-12 DIAGNOSIS — R21 Rash and other nonspecific skin eruption: Secondary | ICD-10-CM

## 2023-04-13 ENCOUNTER — Other Ambulatory Visit: Payer: Self-pay

## 2023-04-13 DIAGNOSIS — R21 Rash and other nonspecific skin eruption: Secondary | ICD-10-CM

## 2023-04-13 MED ORDER — NYSTATIN 100000 UNIT/GM EX POWD: 1.0000 | Freq: Three times a day (TID) | CUTANEOUS | 0 refills | Status: AC

## 2023-04-13 NOTE — Telephone Encounter (Signed)
Requested medication (s) are due for refill today: Yes  Requested medication (s) are on the active medication list: Yes  Last refill:  12/15/22  Future visit scheduled: Yes  Notes to clinic:  Manual review.    Requested Prescriptions  Pending Prescriptions Disp Refills   nystatin (MYCOSTATIN/NYSTOP) powder [Pharmacy Med Name: NYSTATIN 100,000 UNIT/GM POWD] 15 g 0    Sig: APPLY TOPICALLY 3 TIMES A DAY     Off-Protocol Failed - 04/13/2023 11:06 AM      Failed - Medication not assigned to a protocol, review manually.      Passed - Valid encounter within last 12 months    Recent Outpatient Visits           3 months ago Essential hypertension   [place] [facility] Berniece Salines, FNP   10 months ago Localized osteoporosis without current pathological fracture   [facility] Berniece Salines, FNP   1 year ago Acute bronchitis, unspecified organism   [facility] Danelle Berry, PA-C   1 year ago Right lower quadrant abdominal pain   [facility] Berniece Salines, FNP   1 year ago Dyslipidemia   [facility] Berniece Salines, FNP       Future Appointments             In 2 months Zane Herald, Rudolpho Sevin, FNP [facility], [facility]

## 2023-04-16 ENCOUNTER — Other Ambulatory Visit: Payer: Self-pay | Admitting: Nurse Practitioner

## 2023-04-16 ENCOUNTER — Telehealth: Payer: Self-pay | Admitting: Nurse Practitioner

## 2023-04-16 DIAGNOSIS — E039 Hypothyroidism, unspecified: Secondary | ICD-10-CM

## 2023-04-16 DIAGNOSIS — R21 Rash and other nonspecific skin eruption: Secondary | ICD-10-CM

## 2023-04-16 NOTE — Telephone Encounter (Signed)
Medication Refill -  Most Recent Primary Care Visit:  Provider: Della Goo F  Department: [facility]  Visit Type: OFFICE VISIT  Date: 12/15/2022  Medication: nystatin (MYCOSTATIN/NYSTOP) powder   Has the patient contacted their pharmacy? Yes (Agent: If yes, when and what did the pharmacy advise?) Pt reached out to pharmacy and states it didn't seem like they had the prescription, showing it was sent on 04/13/2023. Pt running low requesting refill.   Is this the correct pharmacy for this prescription? Yes This is the patient's preferred pharmacy:  [id number] [place] - [address] [place] [address] Phone: [phone] Fax: [phone]   Has the prescription been filled recently? Yes  Is the patient out of the medication? No  Has the patient been seen for an appointment in the last year OR does the patient have an upcoming appointment? Yes  Can we respond through MyChart? Yes  Agent: Please be advised that Rx refills may take up to 3 business days. We ask that you follow-up with your pharmacy.

## 2023-04-16 NOTE — Telephone Encounter (Signed)
[phone], spoke with Soledad Gerlach, [facility]. Advised of Nystatin powder sent on 04/13/23, she states it was received and picked up today. No further assistance.   Called pt, LVMTCB to make sure pt picked up medication today.

## 2023-04-20 NOTE — Telephone Encounter (Signed)
 Requested Prescriptions  Pending Prescriptions Disp Refills   levothyroxine  (SYNTHROID ) 50 MCG tablet [Pharmacy Med Name: LEVOTHYROXINE  50 MCG TABLET] 90 tablet 3    Sig: TAKE 1 TABLET BY MOUTH EVERY DAY BEFORE BREAKFAST     Endocrinology:  Hypothyroid Agents Passed - 04/20/2023  1:34 PM      Passed - TSH in normal range and within 360 days    TSH  Date Value Ref Range Status  12/15/2022 1.58 0.40 - 4.50 mIU/L Final         Passed - Valid encounter within last 12 months    Recent Outpatient Visits           4 months ago Essential hypertension   [facility] Gareth Mliss FALCON, FNP   10 months ago Localized osteoporosis without current pathological fracture   [facility] Gareth Mliss FALCON, FNP   1 year ago Acute bronchitis, unspecified organism   [facility] Leavy Mole, PA-C   1 year ago Right lower quadrant abdominal pain   [facility] Gareth Mliss FALCON, FNP   1 year ago Dyslipidemia   [facility] Gareth Mliss FALCON, FNP       Future Appointments             In 1 month Gareth, Mliss FALCON, FNP [facility], [facility]

## 2023-05-15 ENCOUNTER — Telehealth: Payer: Self-pay | Admitting: Cardiovascular Disease

## 2023-05-15 NOTE — Telephone Encounter (Signed)
Pt c/o medication issue:  1. Name of Medication:  ELIQUIS 5 MG TABS tablet  2. How are you currently taking this medication (dosage and times per day)?   3. Are you having a reaction (difficulty breathing--STAT)?   4. What is your medication issue?   Patient states her hair has been falling out out and she thinks the Eliquis may be causing it. If her call is returned tomorrow, she requests after 12:00 PM.

## 2023-05-16 NOTE — Telephone Encounter (Signed)
Pt made aware of Pharm D recommendations and verbalized understanding.

## 2023-05-16 NOTE — Telephone Encounter (Signed)
There are post marketing reports involving Eliquis and hairloss but is not common. Recommend she follow up with dermatology or PCP first.

## 2023-05-17 ENCOUNTER — Other Ambulatory Visit: Payer: Self-pay | Admitting: Cardiovascular Disease

## 2023-05-17 DIAGNOSIS — I480 Paroxysmal atrial fibrillation: Secondary | ICD-10-CM

## 2023-05-18 NOTE — Telephone Encounter (Signed)
Prescription refill request for Eliquis received. Indication:afib Last office visit:8/24 Scr:0.90  8/24 Age: 78 Weight:104.2  kg  Prescription refilled

## 2023-05-23 ENCOUNTER — Ambulatory Visit: Payer: Medicare Other | Admitting: Podiatry

## 2023-05-28 ENCOUNTER — Ambulatory Visit: Payer: Medicare Other | Admitting: Podiatry

## 2023-05-28 ENCOUNTER — Encounter: Payer: Self-pay | Admitting: Podiatry

## 2023-05-28 DIAGNOSIS — D2371 Other benign neoplasm of skin of right lower limb, including hip: Secondary | ICD-10-CM

## 2023-05-28 DIAGNOSIS — D2372 Other benign neoplasm of skin of left lower limb, including hip: Secondary | ICD-10-CM

## 2023-05-28 DIAGNOSIS — B351 Tinea unguium: Secondary | ICD-10-CM | POA: Diagnosis not present

## 2023-05-28 DIAGNOSIS — D689 Coagulation defect, unspecified: Secondary | ICD-10-CM | POA: Diagnosis not present

## 2023-05-28 DIAGNOSIS — M79676 Pain in unspecified toe(s): Secondary | ICD-10-CM

## 2023-05-29 NOTE — Progress Notes (Signed)
Presents today for chief complaint of painful elongated nails and calluses bilateral.  Objective: Vital signs are stable alert oriented x 3 pulses are palpable.  No open lesions or wounds are noted.  She has significant callus buildup benign skin lesion buildup to the plantar aspect of the forefoot bilateral toenails are long thick yellow dystrophic like mycotic sharply incurvated tender on palpation.  Assessment: Pain in limb secondary to onychomycosis benign skin lesions.  Plan: Debridement of toenails 1 through 5 bilateral debridement of benign skin lesions.

## 2023-06-08 ENCOUNTER — Ambulatory Visit: Payer: Medicare Other | Admitting: Cardiology

## 2023-06-14 NOTE — Progress Notes (Unsigned)
 There were no vitals taken for this visit.   Subjective:    [name], female    DOB: December 28, 1945, 78 y.o.   MRN: [id number]  HPI: Rebecca Anthony is a 78 y.o. female  No chief complaint on file.   Discussed the use of AI scribe software for clinical note transcription with the patient, who gave verbal consent to proceed.  History of Present Illness           12/15/2022    2:03 PM 09/07/2022    3:21 PM 09/07/2022    3:20 PM  Depression screen PHQ 2/9  Decreased Interest 0 0 0  Down, Depressed, Hopeless 0 0 0  PHQ - 2 Score 0 0 0    Relevant past medical, surgical, family and social history reviewed and updated as indicated. Interim medical history since our last visit reviewed. Allergies and medications reviewed and updated.  Review of Systems  Per HPI unless specifically indicated above     Objective:    There were no vitals taken for this visit.  {Vitals History (Optional):23777} Wt Readings from Last 3 Encounters:  12/15/22 229 lb 12.8 oz (104.2 kg)  11/28/22 229 lb 9.6 oz (104.1 kg)  09/07/22 231 lb (104.8 kg)    Physical Exam  Results for orders placed or performed in visit on 12/15/22  CBC with Differential/Platelet   Collection Time: 12/15/22  2:52 PM  Result Value Ref Range   WBC 6.0 3.8 - 10.8 Thousand/uL   RBC 4.86 3.80 - 5.10 Million/uL   Hemoglobin 13.8 11.7 - 15.5 g/dL   HCT 40.9 81.1 - 91.4 %   MCV 88.1 80.0 - 100.0 fL   MCH 28.4 27.0 - 33.0 pg   MCHC 32.2 32.0 - 36.0 g/dL   RDW 78.2 95.6 - 21.3 %   Platelets 274 140 - 400 Thousand/uL   MPV 9.4 7.5 - 12.5 fL   Neutro Abs 3,972 1,500 - 7,800 cells/uL   Lymphs Abs 1,224 850 - 3,900 cells/uL   Absolute Monocytes 696 200 - 950 cells/uL   Eosinophils Absolute 78 15 - 500 cells/uL   Basophils Absolute 30 0 - 200 cells/uL   Neutrophils Relative % 66.2 %   Total Lymphocyte 20.4 %   Monocytes Relative 11.6 %   Eosinophils Relative 1.3 %   Basophils Relative 0.5 %  COMPLETE  METABOLIC PANEL WITH GFR   Collection Time: 12/15/22  2:52 PM  Result Value Ref Range   Glucose, Bld 93 65 - 99 mg/dL   BUN 22 7 - 25 mg/dL   Creat 0.86 5.78 - 4.69 mg/dL   eGFR 65 > OR = 60 GE/XBM/8.41L2   BUN/Creatinine Ratio SEE NOTE: 6 - 22 (calc)   Sodium 139 135 - 146 mmol/L   Potassium 4.3 3.5 - 5.3 mmol/L   Chloride 103 98 - 110 mmol/L   CO2 25 20 - 32 mmol/L   Calcium 10.1 8.6 - 10.4 mg/dL   Total Protein 6.9 6.1 - 8.1 g/dL   Albumin 4.4 3.6 - 5.1 g/dL   Globulin 2.5 1.9 - 3.7 g/dL (calc)   AG Ratio 1.8 1.0 - 2.5 (calc)   Total Bilirubin 0.7 0.2 - 1.2 mg/dL   Alkaline phosphatase (APISO) 62 37 - 153 U/L   AST 13 10 - 35 U/L   ALT 14 6 - 29 U/L  TSH   Collection Time: 12/15/22  2:52 PM  Result Value Ref Range   TSH 1.58  0.40 - 4.50 mIU/L   {Labs (Optional):23779}    Assessment & Plan:   Problem List Items Addressed This Visit   None    Assessment and Plan             Follow up plan: No follow-ups on file.

## 2023-06-15 ENCOUNTER — Ambulatory Visit: Payer: Medicare Other | Admitting: Nurse Practitioner

## 2023-06-15 ENCOUNTER — Encounter: Payer: Self-pay | Admitting: Nurse Practitioner

## 2023-06-15 VITALS — BP 144/84 | HR 70 | Temp 98.0°F | Resp 18 | Ht 69.0 in | Wt 231.1 lb

## 2023-06-15 DIAGNOSIS — R140 Abdominal distension (gaseous): Secondary | ICD-10-CM

## 2023-06-15 DIAGNOSIS — E039 Hypothyroidism, unspecified: Secondary | ICD-10-CM

## 2023-06-15 DIAGNOSIS — I480 Paroxysmal atrial fibrillation: Secondary | ICD-10-CM | POA: Diagnosis not present

## 2023-06-15 DIAGNOSIS — E66811 Obesity, class 1: Secondary | ICD-10-CM

## 2023-06-15 DIAGNOSIS — R7303 Prediabetes: Secondary | ICD-10-CM

## 2023-06-15 DIAGNOSIS — I700 Atherosclerosis of aorta: Secondary | ICD-10-CM | POA: Diagnosis not present

## 2023-06-15 DIAGNOSIS — I10 Essential (primary) hypertension: Secondary | ICD-10-CM | POA: Diagnosis not present

## 2023-06-15 DIAGNOSIS — E785 Hyperlipidemia, unspecified: Secondary | ICD-10-CM

## 2023-06-16 LAB — COMPLETE METABOLIC PANEL WITH GFR
AG Ratio: 2 (calc) (ref 1.0–2.5)
ALT: 14 U/L (ref 6–29)
AST: 15 U/L (ref 10–35)
Albumin: 4.8 g/dL (ref 3.6–5.1)
Alkaline phosphatase (APISO): 68 U/L (ref 37–153)
BUN: 22 mg/dL (ref 7–25)
CO2: 28 mmol/L (ref 20–32)
Calcium: 10.2 mg/dL (ref 8.6–10.4)
Chloride: 103 mmol/L (ref 98–110)
Creat: 0.91 mg/dL (ref 0.60–1.00)
Globulin: 2.4 g/dL (ref 1.9–3.7)
Glucose, Bld: 93 mg/dL (ref 65–99)
Potassium: 4.5 mmol/L (ref 3.5–5.3)
Sodium: 138 mmol/L (ref 135–146)
Total Bilirubin: 0.7 mg/dL (ref 0.2–1.2)
Total Protein: 7.2 g/dL (ref 6.1–8.1)
eGFR: 64 mL/min/{1.73_m2} (ref >=60)

## 2023-06-16 LAB — CBC WITH DIFFERENTIAL/PLATELET
Absolute Lymphocytes: 1473 {cells}/uL (ref 850–3900)
Absolute Monocytes: 719 {cells}/uL (ref 200–950)
Basophils Absolute: 41 {cells}/uL (ref 0–200)
Basophils Relative: 0.7 %
Eosinophils Absolute: 110 {cells}/uL (ref 15–500)
Eosinophils Relative: 1.9 %
HCT: 42.5 % (ref 35.0–45.0)
Hemoglobin: 13.6 g/dL (ref 11.7–15.5)
MCH: 28.4 pg (ref 27.0–33.0)
MCHC: 32 g/dL (ref 32.0–36.0)
MCV: 88.7 fL (ref 80.0–100.0)
MPV: 9.4 fL (ref 7.5–12.5)
Monocytes Relative: 12.4 %
Neutro Abs: 3457 {cells}/uL (ref 1500–7800)
Neutrophils Relative %: 59.6 %
Platelets: 290 10*3/uL (ref 140–400)
RBC: 4.79 10*6/uL (ref 3.80–5.10)
RDW: 11.8 % (ref 11.0–15.0)
Total Lymphocyte: 25.4 %
WBC: 5.8 10*3/uL (ref 3.8–10.8)

## 2023-06-16 LAB — HEMOGLOBIN A1C
Hgb A1c MFr Bld: 5.8 %{Hb} — ABNORMAL HIGH (ref <5.7)
Mean Plasma Glucose: 120 mg/dL
eAG (mmol/L): 6.6 mmol/L

## 2023-06-16 LAB — LIPID PANEL
Cholesterol: 228 mg/dL — ABNORMAL HIGH (ref <200)
HDL: 75 mg/dL (ref >=50)
LDL Cholesterol (Calc): 134 mg/dL — ABNORMAL HIGH
Non-HDL Cholesterol (Calc): 153 mg/dL — ABNORMAL HIGH (ref <130)
Total CHOL/HDL Ratio: 3 (calc) (ref <5.0)
Triglycerides: 91 mg/dL (ref <150)

## 2023-06-16 LAB — TSH: TSH: 2.65 m[IU]/L (ref 0.40–4.50)

## 2023-06-18 ENCOUNTER — Encounter: Payer: Self-pay | Admitting: Nurse Practitioner

## 2023-06-22 ENCOUNTER — Other Ambulatory Visit: Payer: Self-pay | Admitting: Podiatry

## 2023-07-09 ENCOUNTER — Encounter: Payer: Self-pay | Admitting: Medical

## 2023-07-09 ENCOUNTER — Ambulatory Visit: Payer: Medicare Other | Attending: Medical | Admitting: Medical

## 2023-07-09 VITALS — BP 163/114 | HR 71 | Ht 69.0 in | Wt 227.0 lb

## 2023-07-09 DIAGNOSIS — Z79899 Other long term (current) drug therapy: Secondary | ICD-10-CM | POA: Diagnosis not present

## 2023-07-09 DIAGNOSIS — I4821 Permanent atrial fibrillation: Secondary | ICD-10-CM

## 2023-07-09 DIAGNOSIS — I10 Essential (primary) hypertension: Secondary | ICD-10-CM | POA: Diagnosis not present

## 2023-07-09 MED ORDER — TELMISARTAN 40 MG PO TABS: 40.0000 mg | ORAL_TABLET | Freq: Every day | ORAL | 3 refills | Status: DC

## 2023-07-09 NOTE — Progress Notes (Signed)
  Cardiology Office Note:  .   Date:  07/09/2023  ID:  Esbeydi, Manago 04-Jul-1945, MRN [id number] PCP: Berniece Salines, FNP  [facility] Providers Cardiologist:  Julien Nordmann, MD     History of Present Illness: .   Rebecca Anthony is a 78 y.o. female with a history of hyperlipidemia, permanent A-fib, aortic atherosclerosis, hypertension who presents for 2-month follow-up for A-fib.  Echocardiogram in 2015 showed normal EF.  Patient reports history of A-fib diagnosed when she was living in [place].  In 2015 she had a rapid A-fib following her colonoscopy at [facility] and was started on metoprolol.  Since that time she has had many intermittent EKGs showing A-fib.  She was last seen in August 2024 in rate controlled A-fib.  Today, blood pressure is elevated. She was told to increase metoprolol by her PCP. She does not check blood pressure at home.  She denies chest pain. She feels SOB when she was doing the elliptical and walking around the house.  She uses a cane, and has a walker. She has back and hip pain that limits her function. Feels pain has been worse in the last 3 days. She has been eating more salt.      Physical Exam:   VS:  BP (!) 163/114 (BP Location: Right Arm)   Pulse 71   Ht 5\' 9"  (1.753 m)   Wt 227 lb (103 kg)   SpO2 97%   BMI 33.52 kg/m    Wt Readings from Last 3 Encounters:  07/09/23 227 lb (103 kg)  06/15/23 231 lb 1.6 oz (104.8 kg)  12/15/22 229 lb 12.8 oz (104.2 kg)    GEN: Well nourished, well developed in no acute distress NECK: No JVD; No carotid bruits CARDIAC: RRR, no murmurs, rubs, gallops RESPIRATORY:  Clear to auscultation without rales, wheezing or rhonchi  ABDOMEN: Soft, non-tender, non-distended EXTREMITIES:  No edema; No deformity   ASSESSMENT AND PLAN: .    HTN BP today is high. Arthritic pain and salt intake may be contributing. She takes Toprol 50mg  BID. She has Telmisartan on her list, but she has not been taking this. Start Telmisartan  40mg  daily. BMET in 2 weeks. 1 month follow-up for BP check.    Permanent Afib She is rate controlled Afib. Continue Eliquis 5mg  BID for stroke ppx. Continue Toprol 50mg  BID.   Chronic pain She has chronic back pain that has been worse the last few days, may be contributing to elevated BP.  Dispo: Follow-up 1 month  Signed, Prestyn Mahn David Stall, PA-C

## 2023-07-09 NOTE — Patient Instructions (Signed)
 Medication Instructions:  Your physician recommends the following medication changes.  RESTART: Telmisartan 40 mg by mouth daily  *If you need a refill on your cardiac medications before your next appointment, please call your pharmacy*   Lab Work: Your provider would like for you to return in 2 weeks to have the following labs drawn: (BMP).   Please go to [facility] [address] ([facility] [address], [place] [address] You do not need an appointment.  They are open from 8 am- 4:30 pm.  Lunch from 1:00 pm- 2:00 pm You will not need to be fasting.    Testing/Procedures: No test ordered today    Follow-Up: At [facility], you and your health needs are our priority.  As part of our continuing mission to provide you with exceptional heart care, we have created designated Provider Care Teams.  These Care Teams include your primary Cardiologist (physician) and Advanced Practice Providers (APPs -  Physician Assistants and Nurse Practitioners) who all work together to provide you with the care you need, when you need it.  We recommend signing up for the patient portal called "MyChart".  Sign up information is provided on this After Visit Summary.  MyChart is used to connect with patients for Virtual Visits (Telemedicine).  Patients are able to view lab/test results, encounter notes, upcoming appointments, etc.  Non-urgent messages can be sent to your provider as well.   To learn more about what you can do with MyChart, go to [email or web address].    Your next appointment:   1 month(s)  Provider:   Terrilee Croak, PA-C

## 2023-07-10 ENCOUNTER — Telehealth: Payer: Self-pay | Admitting: Cardiovascular Disease

## 2023-07-10 NOTE — Telephone Encounter (Signed)
 Pt c/o medication issue:  1. Name of Medication:   telmisartan (MICARDIS) 40 MG tablet    2. How are you currently taking this medication (dosage and times per day)?   3. Are you having a reaction (difficulty breathing--STAT)?   4. What is your medication issue?   Patient is following up regarding yesterday's appointment with Cadence Fransico Michael, [place]. She would like to know if she should keep taking Metoprolol since Telmisartan was added. She would also like to know if Telmisartan will cause interaction with Eliquis. Also, is there a specific time of day to take Telmisartan. She mentions that today BP was 134/100 and then 126/81 ans then 128/93.

## 2023-07-10 NOTE — Telephone Encounter (Signed)
 Pt called to confirm if she was is supposed to continue taking metoprolol in addition to the telmisartan. Nurse confirmed that Cadence recommended she continue taking metoprolol. Pt verbalized understanding.

## 2023-07-14 ENCOUNTER — Other Ambulatory Visit: Payer: Self-pay | Admitting: Nurse Practitioner

## 2023-07-14 DIAGNOSIS — E039 Hypothyroidism, unspecified: Secondary | ICD-10-CM

## 2023-07-17 NOTE — Telephone Encounter (Signed)
 Requested Prescriptions  Pending Prescriptions Disp Refills   levothyroxine (SYNTHROID) 50 MCG tablet [Pharmacy Med Name: LEVOTHYROXINE 50 MCG TABLET] 90 tablet 3    Sig: TAKE 1 TABLET BY MOUTH EVERY DAY BEFORE BREAKFAST     Endocrinology:  Hypothyroid Agents Passed - 07/17/2023 12:17 PM      Passed - TSH in normal range and within 360 days    TSH  Date Value Ref Range Status  06/15/2023 2.65 0.40 - 4.50 mIU/L Final         Passed - Valid encounter within last 12 months    Recent Outpatient Visits           1 month ago PAF (paroxysmal atrial fibrillation) [facility])   [facility] Berniece Salines, FNP       Future Appointments             In 3 weeks Fransico Michael, Cadence H, PA-C [facility] [place]   In 4 months Zane Herald, Rudolpho Sevin, FNP [facility], [facility]

## 2023-07-23 ENCOUNTER — Telehealth: Payer: Self-pay | Admitting: Nurse Practitioner

## 2023-07-23 ENCOUNTER — Other Ambulatory Visit: Payer: Self-pay | Admitting: Nurse Practitioner

## 2023-07-23 DIAGNOSIS — Z1231 Encounter for screening mammogram for malignant neoplasm of breast: Secondary | ICD-10-CM

## 2023-07-23 NOTE — Telephone Encounter (Signed)
 Pt scheduled having breast tenderness

## 2023-07-23 NOTE — Telephone Encounter (Signed)
 Copied from CRM [id number]. Topic: Clinical - Medical Advice >> Jul 23, 2023  3:32 PM Antwanette L wrote: Reason for CRM: Patient wants to know if she should schedule a mammogram with  [facility] at [facility]. Patient is requesting a callback from either Della Goo or her nurse at [phone]

## 2023-07-27 ENCOUNTER — Other Ambulatory Visit
Admission: RE | Admit: 2023-07-27 | Discharge: 2023-07-27 | Disposition: A | Discharge status: Home / Self Care | Source: Ambulatory Visit | Attending: Medical | Admitting: Medical

## 2023-07-27 DIAGNOSIS — Z79899 Other long term (current) drug therapy: Secondary | ICD-10-CM | POA: Diagnosis present

## 2023-07-27 LAB — BASIC METABOLIC PANEL WITH GFR
Anion gap: 8 (ref 5–15)
BUN: 23 mg/dL (ref 8–23)
CO2: 23 mmol/L (ref 22–32)
Calcium: 9.5 mg/dL (ref 8.9–10.3)
Chloride: 106 mmol/L (ref 98–111)
Creatinine, Ser: 0.73 mg/dL (ref 0.44–1.00)
GFR, Estimated: >60 mL/min (ref >60)
Glucose, Bld: 104 mg/dL — ABNORMAL HIGH (ref 70–99)
Potassium: 4.2 mmol/L (ref 3.5–5.1)
Sodium: 137 mmol/L (ref 135–145)

## 2023-08-03 ENCOUNTER — Encounter: Payer: Self-pay | Admitting: Emergency Medicine

## 2023-08-09 ENCOUNTER — Ambulatory Visit: Attending: Medical | Admitting: Medical

## 2023-08-09 ENCOUNTER — Encounter: Payer: Self-pay | Admitting: Medical

## 2023-08-09 VITALS — BP 120/70 | HR 84 | Ht 69.0 in | Wt 223.5 lb

## 2023-08-09 DIAGNOSIS — I10 Essential (primary) hypertension: Secondary | ICD-10-CM | POA: Diagnosis not present

## 2023-08-09 DIAGNOSIS — I4821 Permanent atrial fibrillation: Secondary | ICD-10-CM | POA: Diagnosis not present

## 2023-08-09 DIAGNOSIS — I700 Atherosclerosis of aorta: Secondary | ICD-10-CM | POA: Diagnosis not present

## 2023-08-09 NOTE — Progress Notes (Signed)
  Cardiology Office Note:  .   Date:  08/09/2023  ID:  Mardee, Clune 1946/02/10, MRN [id number] PCP: Quinton Buckler, FNP  [facility] Providers Cardiologist:  Belva Boyden, MD     History of Present Illness: .   Rebecca Anthony is a 78 y.o. female with a history of hyperlipidemia, permanent A-fib, aortic atherosclerosis, hypertension who presents for 60-month follow-up for A-fib.   Echocardiogram in 2015 showed normal EF.  Patient reports history of A-fib diagnosed when she was living in [place] .  In 2015 she had a rapid A-fib following her colonoscopy at [facility] and was started on metoprolol .  Since that time she has had many intermittent EKGs showing A-fib.  She was last seen in August 2024 in rate controlled A-fib.   The patient was last seen 07/09/23 and BP was high. She was started on Telmisartan .   Today, the patient is overall doing well. BP at home has been better. BP today is much better. She is wanting to get back into exercise. Weight has gone down some.   Studies Reviewed: Aaron Aas   EKG Interpretation Date/Time:  Thursday August 09 2023 15:14:32 EDT Ventricular Rate:  84 PR Interval:    QRS Duration:  80 QT Interval:  342 QTC Calculation: 404 R Axis:   18  Text Interpretation: Atrial fibrillation Nonspecific ST abnormality When compared with ECG of 28-Nov-2022 16:46, No significant change was found Confirmed by Gennaro Khat, Aerica Rincon ([id number]) on 08/09/2023 3:20:26 PM        Physical Exam:   VS:  BP 120/70 (BP Location: Left Arm, Patient Position: Sitting, Cuff Size: Normal)   Pulse 84   Ht 5\' 9"  (1.753 m)   Wt 223 lb 8 oz (101.4 kg)   SpO2 97%   BMI 33.01 kg/m    Wt Readings from Last 3 Encounters:  08/09/23 223 lb 8 oz (101.4 kg)  07/09/23 227 lb (103 kg)  06/15/23 231 lb 1.6 oz (104.8 kg)    GEN: Well nourished, well developed in no acute distress NECK: No JVD; No carotid bruits CARDIAC: Irreg Irreg, no murmurs, rubs, gallops RESPIRATORY:  Clear to auscultation  without rales, wheezing or rhonchi  ABDOMEN: Soft, non-tender, non-distended EXTREMITIES:  No edema; No deformity   ASSESSMENT AND PLAN: .    HTN  BP is much better with Telmisartan . Continue Toprol  50mg  BID and Telmisartan  40mg  daily.   Permanent Afib She is in rate controlled Afib. Continue Eliquis  5mg  BID for stroke ppx.        Dispo: Follow-up in 6 months  Signed, Tramel Westbrook Rebekah Canada, PA-C

## 2023-08-09 NOTE — Patient Instructions (Signed)
 Medication Instructions:  Your Physician recommend you continue on your current medication as directed.    *If you need a refill on your cardiac medications before your next appointment, please call your pharmacy*  Lab Work: No labs ordered today  If you have labs (blood work) drawn today and your tests are completely normal, you will receive your results only by: MyChart Message (if you have MyChart) OR A paper copy in the mail If you have any lab test that is abnormal or we need to change your treatment, we will call you to review the results.  Testing/Procedures: No test ordered today   Follow-Up: At [facility], you and your health needs are our priority.  As part of our continuing mission to provide you with exceptional heart care, our providers are all part of one team.  This team includes your primary Cardiologist (physician) and Advanced Practice Providers or APPs (Physician Assistants and Nurse Practitioners) who all work together to provide you with the care you need, when you need it.  Your next appointment:   6 month(s)  Provider:   Timothy Gollan, MD

## 2023-08-13 ENCOUNTER — Ambulatory Visit: Admitting: Nurse Practitioner

## 2023-08-14 IMAGING — MG MM DIGITAL SCREENING BILAT W/ TOMO AND CAD
8 series · 8 of 24 positions shown · non-contrast
Comparison: Previous exam(s).

CLINICAL DATA: Screening.

EXAM:
DIGITAL SCREENING BILATERAL MAMMOGRAM WITH TOMOSYNTHESIS AND CAD
TECHNIQUE: Bilateral screening digital craniocaudal and mediolateral oblique
mammograms were obtained. Bilateral screening digital breast
tomosynthesis was performed. The images were evaluated with
computer-aided detection.

[L MLO synth-2D]
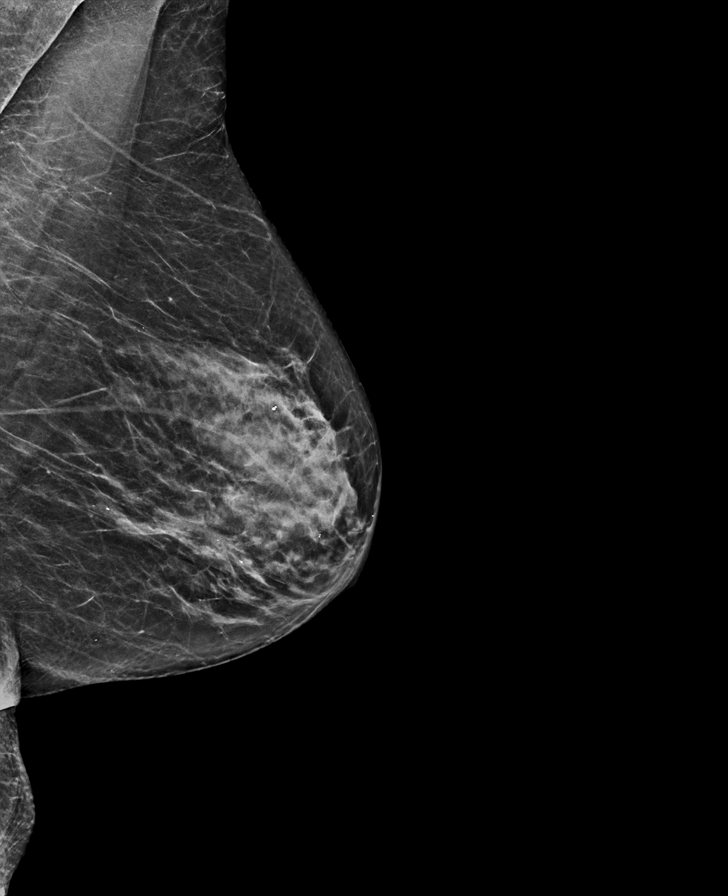

[R MLO synth-2D]
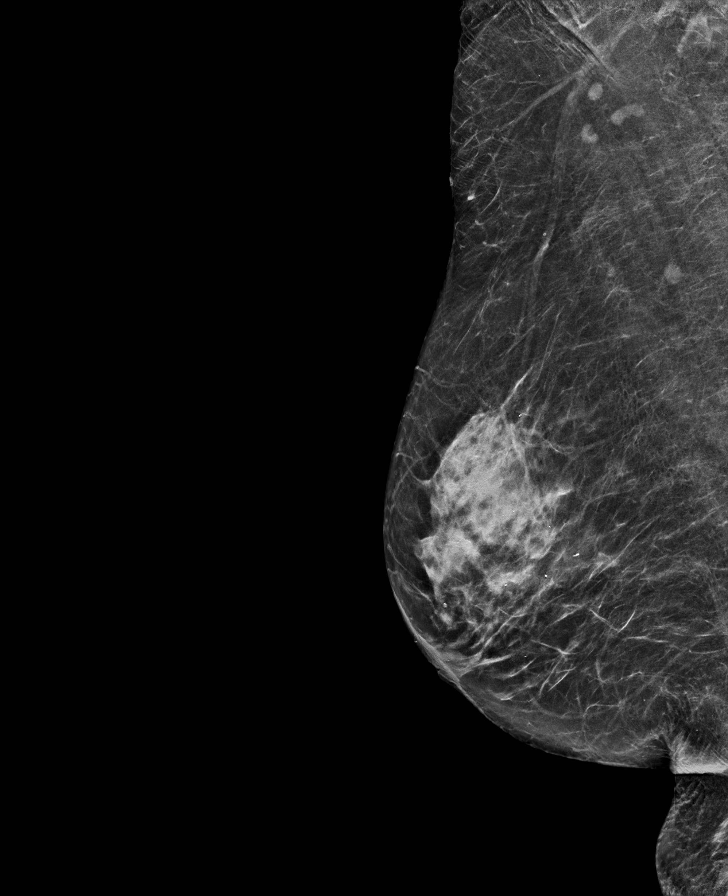

[L CC synth-2D]
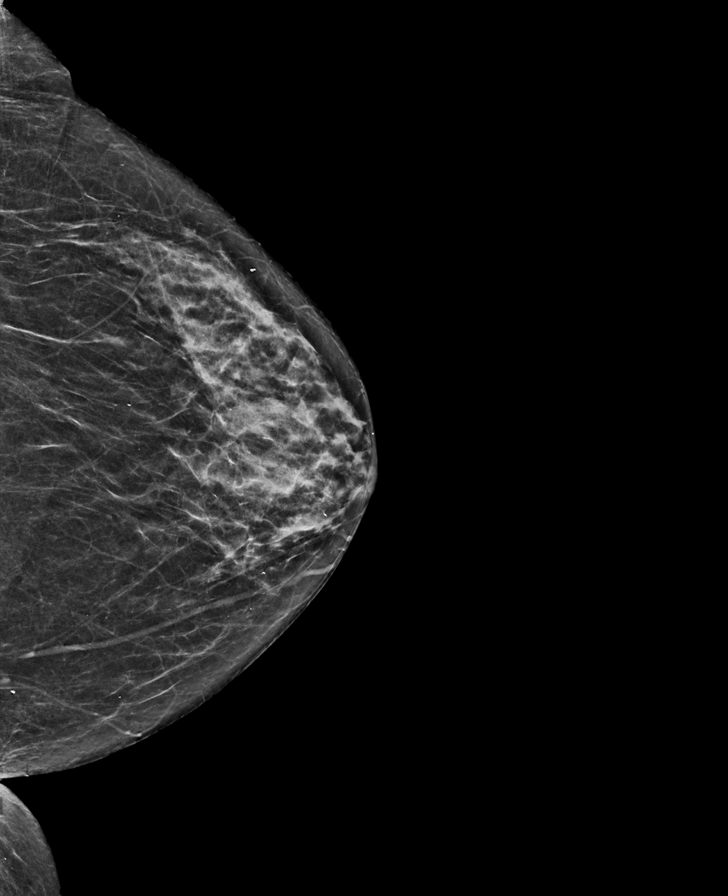

[R CC synth-2D]
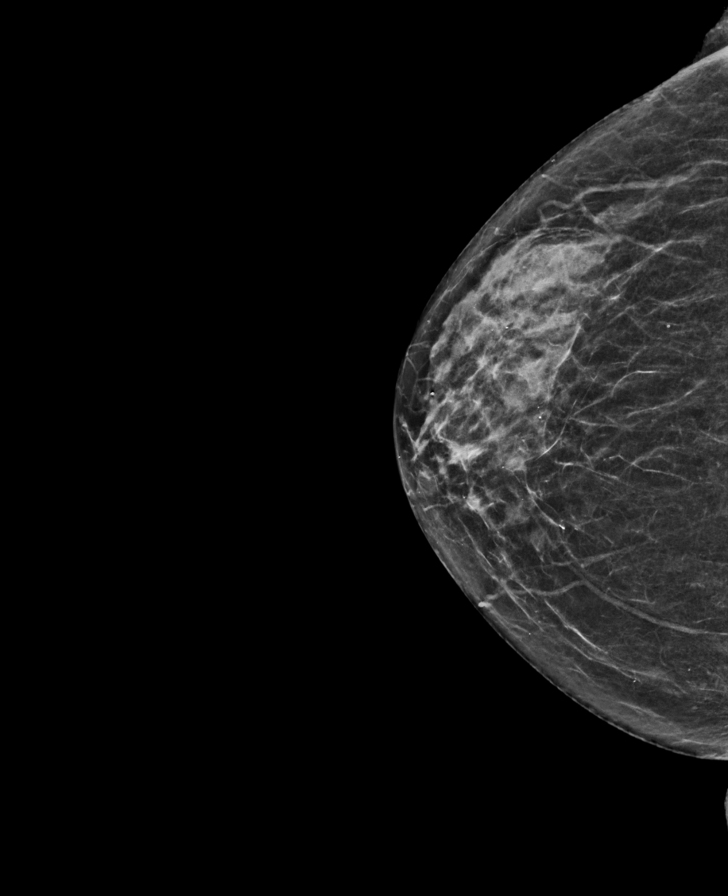

[R CC tomo · tomo slice 28/55.0]
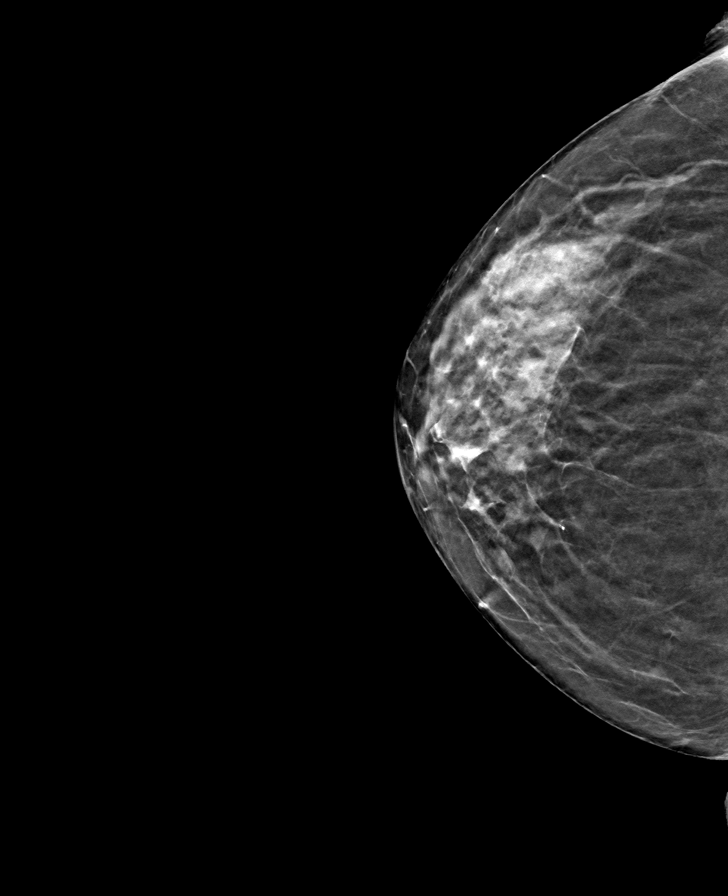

[L CC tomo · tomo slice 27/53.0]
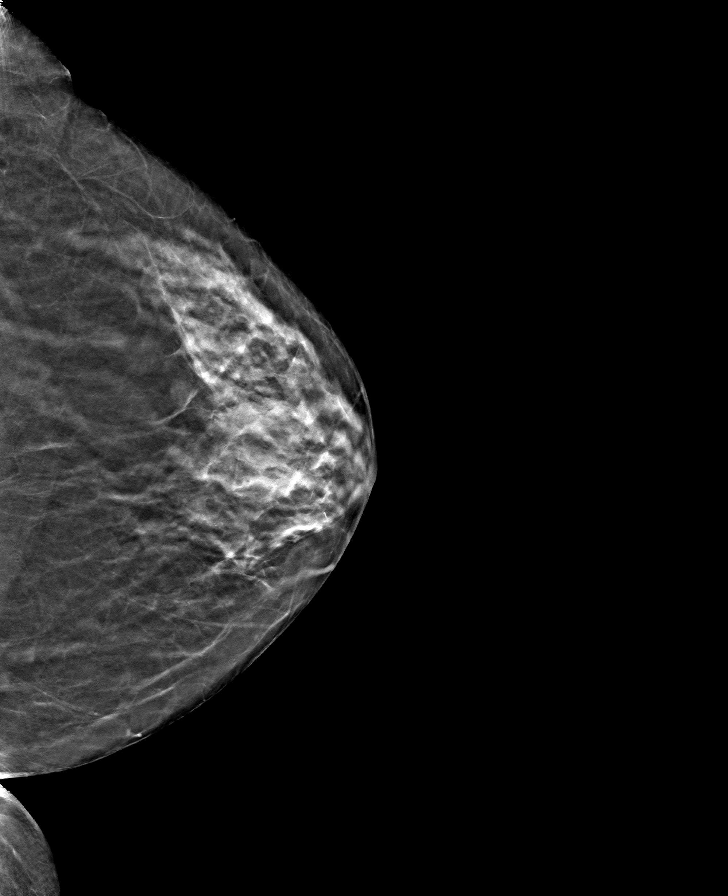

[R MLO tomo · tomo slice 32/63.0]
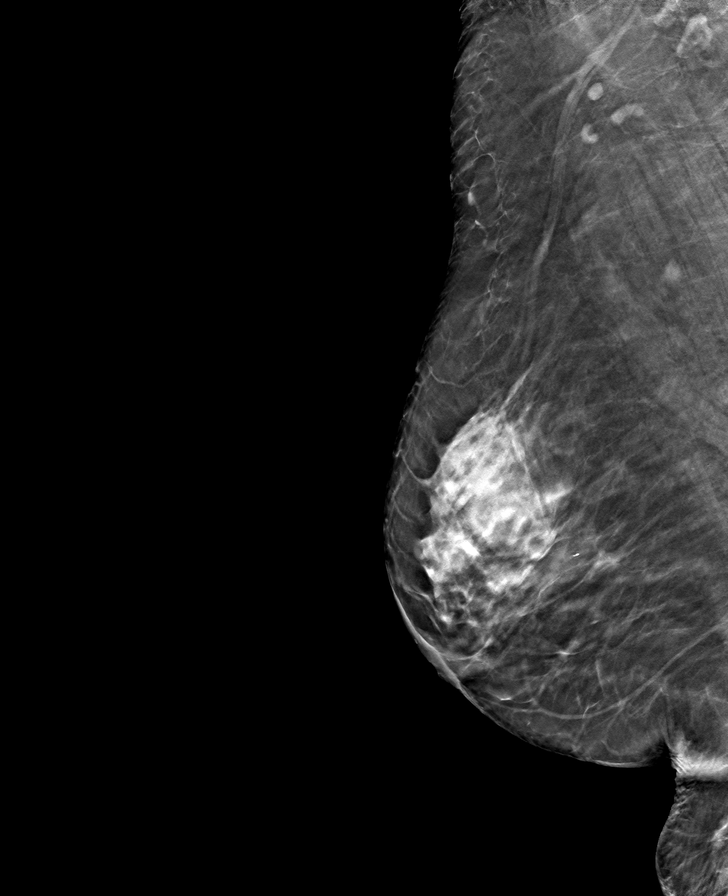

[L MLO tomo · tomo slice 29/58.0]
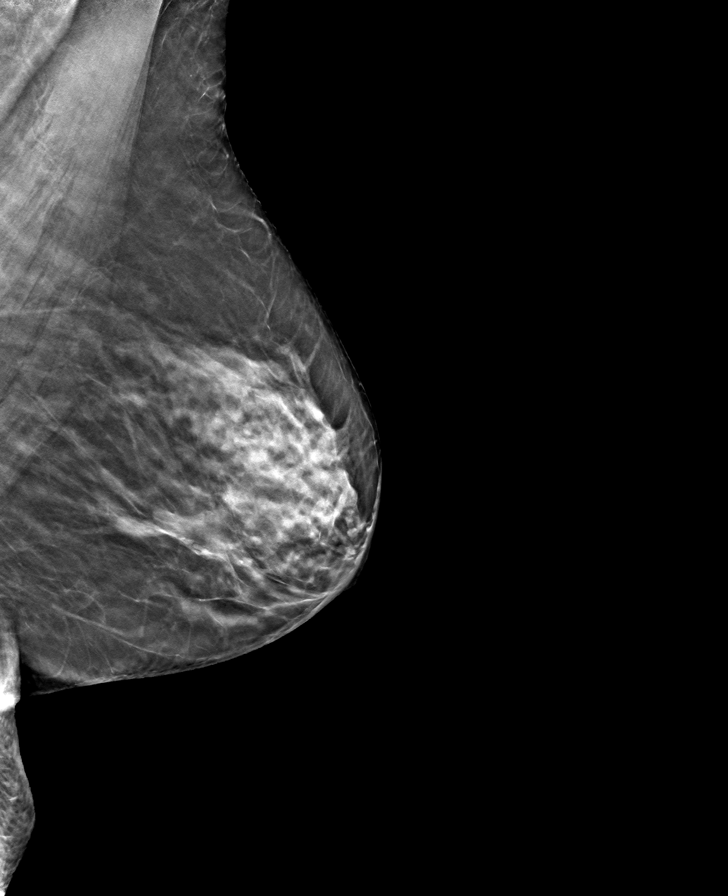

[8 of 24 positions shown; findings below may reference images not displayed]

ACR Breast Density Category c: The breast tissue is heterogeneously
dense, which may obscure small masses.
FINDINGS: There are no findings suspicious for malignancy.
IMPRESSION: No mammographic evidence of malignancy. A result letter of this
screening mammogram will be mailed directly to the patient.

RECOMMENDATION:
Screening mammogram in one year. (Code:Q3-W-BC3)

BI-RADS CATEGORY  1: Negative.

## 2023-08-17 ENCOUNTER — Ambulatory Visit: Admitting: Nurse Practitioner

## 2023-08-22 ENCOUNTER — Ambulatory Visit: Payer: Medicare Other | Admitting: Podiatry

## 2023-08-22 ENCOUNTER — Encounter: Payer: Self-pay | Admitting: Podiatry

## 2023-08-22 DIAGNOSIS — D689 Coagulation defect, unspecified: Secondary | ICD-10-CM | POA: Diagnosis not present

## 2023-08-22 DIAGNOSIS — M79676 Pain in unspecified toe(s): Secondary | ICD-10-CM

## 2023-08-22 DIAGNOSIS — D2371 Other benign neoplasm of skin of right lower limb, including hip: Secondary | ICD-10-CM | POA: Diagnosis not present

## 2023-08-22 DIAGNOSIS — D2372 Other benign neoplasm of skin of left lower limb, including hip: Secondary | ICD-10-CM | POA: Diagnosis not present

## 2023-08-22 DIAGNOSIS — B351 Tinea unguium: Secondary | ICD-10-CM

## 2023-08-23 NOTE — Progress Notes (Signed)
 Presents today for chief complaint of painful elongated nails and calluses bilateral.  Objective: Vital signs are stable alert oriented x 3 pulses are palpable.  No open lesions or wounds are noted.  She has significant callus buildup benign skin lesion buildup to the plantar aspect of the forefoot bilateral toenails are long thick yellow dystrophic like mycotic sharply incurvated tender on palpation.  Assessment: Pain in limb secondary to onychomycosis benign skin lesions.  Plan: Debridement of toenails 1 through 5 bilateral debridement of benign skin lesions.  She will also use Voltaren gel for the occasional heel pain and midfoot pain.

## 2023-08-24 ENCOUNTER — Ambulatory Visit: Admitting: Nurse Practitioner

## 2023-08-27 ENCOUNTER — Ambulatory Visit: Admitting: Nurse Practitioner

## 2023-09-04 NOTE — Progress Notes (Deleted)
   There were no vitals taken for this visit.   Subjective:    [name], female    DOB: March 01, 1946, 78 y.o.   MRN: [id number]  HPI: Rebecca Anthony is a 78 y.o. female  No chief complaint on file.   Discussed the use of AI scribe software for clinical note transcription with the patient, who gave verbal consent to proceed.  History of Present Illness          12/15/2022    2:03 PM 09/07/2022    3:21 PM 09/07/2022    3:20 PM  Depression screen PHQ 2/9  Decreased Interest 0 0 0  Down, Depressed, Hopeless 0 0 0  PHQ - 2 Score 0 0 0    Relevant past medical, surgical, family and social history reviewed and updated as indicated. Interim medical history since our last visit reviewed. Allergies and medications reviewed and updated.  Review of Systems  Per HPI unless specifically indicated above     Objective:      There were no vitals taken for this visit.  {Vitals History (Optional):23777} Wt Readings from Last 3 Encounters:  08/09/23 223 lb 8 oz (101.4 kg)  07/09/23 227 lb (103 kg)  06/15/23 231 lb 1.6 oz (104.8 kg)    Physical Exam Physical Exam    Results for orders placed or performed during the hospital encounter of 07/27/23  Basic metabolic panel   Collection Time: 07/27/23  3:58 PM  Result Value Ref Range   Sodium 137 135 - 145 mmol/L   Potassium 4.2 3.5 - 5.1 mmol/L   Chloride 106 98 - 111 mmol/L   CO2 23 22 - 32 mmol/L   Glucose, Bld 104 (H) 70 - 99 mg/dL   BUN 23 8 - 23 mg/dL   Creatinine, Ser 4.09 0.44 - 1.00 mg/dL   Calcium  9.5 8.9 - 10.3 mg/dL   GFR, Estimated >81 >19 mL/min   Anion gap 8 5 - 15   {Labs (Optional):23779}       Assessment & Plan:   Problem List Items Addressed This Visit   None    Assessment and Plan Assessment & Plan         Follow up plan: No follow-ups on file.

## 2023-09-05 ENCOUNTER — Ambulatory Visit: Admitting: Nurse Practitioner

## 2023-09-06 ENCOUNTER — Encounter: Payer: Self-pay | Admitting: Nurse Practitioner

## 2023-09-06 ENCOUNTER — Ambulatory Visit: Admitting: Nurse Practitioner

## 2023-09-06 VITALS — BP 142/84 | HR 96 | Temp 97.8°F | Ht 69.0 in | Wt 229.0 lb

## 2023-09-06 DIAGNOSIS — N644 Mastodynia: Secondary | ICD-10-CM

## 2023-09-06 DIAGNOSIS — I10 Essential (primary) hypertension: Secondary | ICD-10-CM

## 2023-09-06 NOTE — Progress Notes (Signed)
 BP (!) 142/84   Pulse 96   Temp 97.8 F (36.6 C)   Ht 5\' 9"  (1.753 m)   Wt 229 lb (103.9 kg)   SpO2 97%   BMI 33.82 kg/m    Subjective:    [name], female    DOB: 10-17-1945, 78 y.o.   MRN: [id number]  HPI: Rebecca Anthony is a 78 y.o. female  Chief Complaint  Patient presents with   Breast Problem    Left axilla tenderness     Discussed the use of AI scribe software for clinical note transcription with the patient, who gave verbal consent to proceed.  History of Present Illness Rebecca Anthony is a 78 year old female who presents with left breast tenderness.  She experiences tenderness in her left breast, particularly near the axilla, which is most noticeable when she raises her arm. There is a sensation of hardness in the area. She has not yet scheduled a mammogram, with her last one being on August 15, 2022.  She mentions a rash-like area under her breast, initially treated with Noxzema for relief. The rash was due to skin rubbing, leading to irritation, but it has cleared up over the past three days. She also notes a skin tag and a cherry hemangioma in the area.  Her blood pressure has been slightly elevated, with recent readings of 142/88. She is currently taking telmisartan  and metoprolol , with the latter increased to two doses per day due to previously high blood pressure. She takes one metoprolol  in the morning and one in the evening, with telmisartan  in between. She attributes some of her blood pressure issues to recent family stress.  She has noticed hair loss, which has slowed down recently. Her hairdresser has observed less hair loss compared to previous visits. She has a history of thick hair and is concerned about potential bald spots, although none have been observed.         09/06/2023    1:27 PM 12/15/2022    2:03 PM 09/07/2022    3:21 PM  Depression screen PHQ 2/9  Decreased Interest 0 0 0  Down, Depressed, Hopeless 0 0 0  PHQ - 2 Score  0 0 0  Altered sleeping 0    Tired, decreased energy 0    Change in appetite 0    Feeling bad or failure about yourself  0    Trouble concentrating 0    Moving slowly or fidgety/restless 0    Suicidal thoughts 0    PHQ-9 Score 0    Difficult doing work/chores Not difficult at all      Relevant past medical, surgical, family and social history reviewed and updated as indicated. Interim medical history since our last visit reviewed. Allergies and medications reviewed and updated.  Review of Systems  Per HPI unless specifically indicated above     Objective:      BP (!) 142/84   Pulse 96   Temp 97.8 F (36.6 C)   Ht 5\' 9"  (1.753 m)   Wt 229 lb (103.9 kg)   SpO2 97%   BMI 33.82 kg/m    Wt Readings from Last 3 Encounters:  09/06/23 229 lb (103.9 kg)  08/09/23 223 lb 8 oz (101.4 kg)  07/09/23 227 lb (103 kg)    Physical Exam Vitals reviewed. Exam conducted with a chaperone present.  Constitutional:      Appearance: Normal appearance.  HENT:     Head: Normocephalic.  Cardiovascular:     Rate and Rhythm: Normal rate and regular rhythm.  Pulmonary:     Effort: Pulmonary effort is normal.     Breath sounds: Normal breath sounds.  Chest:  Breasts:    Right: Normal.     Left: Tenderness present.  Musculoskeletal:        General: Normal range of motion.  Skin:    General: Skin is warm and dry.  Neurological:     General: No focal deficit present.     Mental Status: She is alert and oriented to person, place, and time. Mental status is at baseline.  Psychiatric:        Mood and Affect: Mood normal.        Behavior: Behavior normal.        Thought Content: Thought content normal.        Judgment: Judgment normal.       Results for orders placed or performed during the hospital encounter of 07/27/23  Basic metabolic panel   Collection Time: 07/27/23  3:58 PM  Result Value Ref Range   Sodium 137 135 - 145 mmol/L   Potassium 4.2 3.5 - 5.1 mmol/L   Chloride 106  98 - 111 mmol/L   CO2 23 22 - 32 mmol/L   Glucose, Bld 104 (H) 70 - 99 mg/dL   BUN 23 8 - 23 mg/dL   Creatinine, Ser 1.61 0.44 - 1.00 mg/dL   Calcium  9.5 8.9 - 10.3 mg/dL   GFR, Estimated >09 >60 mL/min   Anion gap 8 5 - 15          Assessment & Plan:   Problem List Items Addressed This Visit       Cardiovascular and Mediastinum   Essential hypertension   Other Visit Diagnoses       Breast pain    -  Primary   Relevant Orders   MM 3D DIAGNOSTIC MAMMOGRAM BILATERAL BREAST   US  LIMITED ULTRASOUND INCLUDING AXILLA LEFT BREAST    US  LIMITED ULTRASOUND INCLUDING AXILLA RIGHT BREAST        Assessment and Plan Assessment & Plan Breast tenderness Left breast tenderness near the axilla with no palpable masses or abnormalities on examination. Diagnostic imaging is necessary to rule out underlying pathology. - Order diagnostic mammogram and breast ultrasound  Skin irritation under breast Skin irritation under the breast likely due to friction and moisture accumulation. No signs of infection. Presence of benign skin tags and cherry hemangioma. - Advise keeping the area dry to prevent moisture buildup  Elevated blood pressure Slightly elevated blood pressure today. Current regimen includes telmisartan  and metoprolol , adjusted by cardiology. Stress and dietary factors may contribute to fluctuations. She is advised to continue her current medication regimen and monitor blood pressure regularly. - Recheck blood pressure before she leaves - Continue telmisartan  and metoprolol  as prescribed -try and reduce stress, eat a heart healthy diet, decrease sodium, getting exercise can help too.         Follow up plan: Return if symptoms worsen or fail to improve.

## 2023-09-13 ENCOUNTER — Ambulatory Visit: Payer: Medicare Other

## 2023-09-18 ENCOUNTER — Ambulatory Visit
Admission: RE | Admit: 2023-09-18 | Discharge: 2023-09-18 | Disposition: A | Discharge status: Home / Self Care | Source: Ambulatory Visit | Attending: Nurse Practitioner | Admitting: Nurse Practitioner

## 2023-09-18 DIAGNOSIS — N644 Mastodynia: Secondary | ICD-10-CM

## 2023-10-24 ENCOUNTER — Other Ambulatory Visit: Payer: Self-pay | Admitting: Podiatry

## 2023-10-24 ENCOUNTER — Other Ambulatory Visit: Payer: Self-pay | Admitting: Cardiovascular Disease

## 2023-10-24 DIAGNOSIS — I480 Paroxysmal atrial fibrillation: Secondary | ICD-10-CM

## 2023-10-24 DIAGNOSIS — I10 Essential (primary) hypertension: Secondary | ICD-10-CM

## 2023-11-18 ENCOUNTER — Other Ambulatory Visit: Payer: Self-pay | Admitting: Cardiovascular Disease

## 2023-11-18 DIAGNOSIS — I480 Paroxysmal atrial fibrillation: Secondary | ICD-10-CM

## 2023-11-19 NOTE — Telephone Encounter (Signed)
 Prescription refill request for Eliquis  received. Indication:afib Last office visit:4/25 Scr:0.73  4/25 Age: 78 Weight:103.9  kg  Prescription refilled

## 2023-11-21 ENCOUNTER — Ambulatory Visit: Admitting: Podiatry

## 2023-11-28 ENCOUNTER — Ambulatory Visit: Admitting: Podiatry

## 2023-12-03 ENCOUNTER — Emergency Department

## 2023-12-03 ENCOUNTER — Other Ambulatory Visit: Payer: Self-pay

## 2023-12-03 ENCOUNTER — Inpatient Hospital Stay
Admission: EM | Admit: 2023-12-03 | Discharge: 2023-12-10 | DRG: 309 | Disposition: A | Discharge status: Skilled Nursing Facility | Attending: Internal Medicine | Admitting: Internal Medicine

## 2023-12-03 DIAGNOSIS — E7800 Pure hypercholesterolemia, unspecified: Secondary | ICD-10-CM | POA: Diagnosis present

## 2023-12-03 DIAGNOSIS — D72829 Elevated white blood cell count, unspecified: Secondary | ICD-10-CM | POA: Diagnosis present

## 2023-12-03 DIAGNOSIS — I2489 Other forms of acute ischemic heart disease: Secondary | ICD-10-CM | POA: Diagnosis present

## 2023-12-03 DIAGNOSIS — Z751 Person awaiting admission to adequate facility elsewhere: Secondary | ICD-10-CM

## 2023-12-03 DIAGNOSIS — I4891 Unspecified atrial fibrillation: Secondary | ICD-10-CM | POA: Diagnosis not present

## 2023-12-03 DIAGNOSIS — Z79899 Other long term (current) drug therapy: Secondary | ICD-10-CM

## 2023-12-03 DIAGNOSIS — I4821 Permanent atrial fibrillation: Secondary | ICD-10-CM | POA: Diagnosis not present

## 2023-12-03 DIAGNOSIS — Z7901 Long term (current) use of anticoagulants: Secondary | ICD-10-CM

## 2023-12-03 DIAGNOSIS — E876 Hypokalemia: Secondary | ICD-10-CM | POA: Diagnosis present

## 2023-12-03 DIAGNOSIS — T68XXXA Hypothermia, initial encounter: Secondary | ICD-10-CM | POA: Diagnosis present

## 2023-12-03 DIAGNOSIS — M170 Bilateral primary osteoarthritis of knee: Secondary | ICD-10-CM | POA: Diagnosis present

## 2023-12-03 DIAGNOSIS — I10 Essential (primary) hypertension: Secondary | ICD-10-CM | POA: Diagnosis present

## 2023-12-03 DIAGNOSIS — M810 Age-related osteoporosis without current pathological fracture: Secondary | ICD-10-CM | POA: Diagnosis present

## 2023-12-03 DIAGNOSIS — R680 Hypothermia, not associated with low environmental temperature: Secondary | ICD-10-CM | POA: Diagnosis present

## 2023-12-03 DIAGNOSIS — E039 Hypothyroidism, unspecified: Secondary | ICD-10-CM | POA: Diagnosis present

## 2023-12-03 DIAGNOSIS — Z90710 Acquired absence of both cervix and uterus: Secondary | ICD-10-CM

## 2023-12-03 DIAGNOSIS — Z8619 Personal history of other infectious and parasitic diseases: Secondary | ICD-10-CM

## 2023-12-03 DIAGNOSIS — Z8349 Family history of other endocrine, nutritional and metabolic diseases: Secondary | ICD-10-CM

## 2023-12-03 DIAGNOSIS — E66811 Obesity, class 1: Secondary | ICD-10-CM | POA: Diagnosis present

## 2023-12-03 DIAGNOSIS — R29898 Other symptoms and signs involving the musculoskeletal system: Secondary | ICD-10-CM | POA: Diagnosis not present

## 2023-12-03 DIAGNOSIS — I959 Hypotension, unspecified: Secondary | ICD-10-CM | POA: Diagnosis present

## 2023-12-03 DIAGNOSIS — Z79890 Hormone replacement therapy (postmenopausal): Secondary | ICD-10-CM

## 2023-12-03 DIAGNOSIS — I5A Non-ischemic myocardial injury (non-traumatic): Secondary | ICD-10-CM | POA: Diagnosis present

## 2023-12-03 DIAGNOSIS — Z6833 Body mass index (BMI) 33.0-33.9, adult: Secondary | ICD-10-CM

## 2023-12-03 DIAGNOSIS — M4316 Spondylolisthesis, lumbar region: Secondary | ICD-10-CM | POA: Diagnosis present

## 2023-12-03 DIAGNOSIS — M48061 Spinal stenosis, lumbar region without neurogenic claudication: Secondary | ICD-10-CM | POA: Diagnosis present

## 2023-12-03 DIAGNOSIS — Z833 Family history of diabetes mellitus: Secondary | ICD-10-CM

## 2023-12-03 DIAGNOSIS — H9190 Unspecified hearing loss, unspecified ear: Secondary | ICD-10-CM | POA: Diagnosis present

## 2023-12-03 DIAGNOSIS — Z8249 Family history of ischemic heart disease and other diseases of the circulatory system: Secondary | ICD-10-CM

## 2023-12-03 DIAGNOSIS — R531 Weakness: Secondary | ICD-10-CM

## 2023-12-03 DIAGNOSIS — R7303 Prediabetes: Secondary | ICD-10-CM | POA: Diagnosis present

## 2023-12-03 LAB — COMPREHENSIVE METABOLIC PANEL WITH GFR
ALT: 22 U/L (ref 0–44)
AST: 54 U/L — ABNORMAL HIGH (ref 15–41)
Albumin: 3.8 g/dL (ref 3.5–5.0)
Alkaline Phosphatase: 62 U/L (ref 38–126)
Anion gap: 10 (ref 5–15)
BUN: 20 mg/dL (ref 8–23)
CO2: 20 mmol/L — ABNORMAL LOW (ref 22–32)
Calcium: 9.2 mg/dL (ref 8.9–10.3)
Chloride: 107 mmol/L (ref 98–111)
Creatinine, Ser: 0.65 mg/dL (ref 0.44–1.00)
GFR, Estimated: >60 mL/min (ref >60)
Glucose, Bld: 102 mg/dL — ABNORMAL HIGH (ref 70–99)
Potassium: 3.1 mmol/L — ABNORMAL LOW (ref 3.5–5.1)
Sodium: 137 mmol/L (ref 135–145)
Total Bilirubin: 1.1 mg/dL (ref 0.0–1.2)
Total Protein: 6.9 g/dL (ref 6.5–8.1)

## 2023-12-03 LAB — CBC WITH DIFFERENTIAL/PLATELET
Abs Immature Granulocytes: 0.05 K/uL (ref 0.00–0.07)
Basophils Absolute: 0 K/uL (ref 0.0–0.1)
Basophils Relative: 0 %
Eosinophils Absolute: 0 K/uL (ref 0.0–0.5)
Eosinophils Relative: 0 %
HCT: 34.8 % — ABNORMAL LOW (ref 36.0–46.0)
Hemoglobin: 11.2 g/dL — ABNORMAL LOW (ref 12.0–15.0)
Immature Granulocytes: 1 %
Lymphocytes Relative: 11 %
Lymphs Abs: 1.2 K/uL (ref 0.7–4.0)
MCH: 27.9 pg (ref 26.0–34.0)
MCHC: 32.2 g/dL (ref 30.0–36.0)
MCV: 86.8 fL (ref 80.0–100.0)
Monocytes Absolute: 1.1 K/uL — ABNORMAL HIGH (ref 0.1–1.0)
Monocytes Relative: 10 %
Neutro Abs: 8.5 K/uL — ABNORMAL HIGH (ref 1.7–7.7)
Neutrophils Relative %: 78 %
Platelets: 253 K/uL (ref 150–400)
RBC: 4.01 MIL/uL (ref 3.87–5.11)
RDW: 13 % (ref 11.5–15.5)
WBC: 10.9 K/uL — ABNORMAL HIGH (ref 4.0–10.5)
nRBC: 0 % (ref 0.0–0.2)

## 2023-12-03 LAB — SEDIMENTATION RATE: Sed Rate: 13 mm/h (ref 0–22)

## 2023-12-03 LAB — PHOSPHORUS: Phosphorus: 1.9 mg/dL — ABNORMAL LOW (ref 2.5–4.6)

## 2023-12-03 LAB — T4, FREE: Free T4: 1.11 ng/dL (ref 0.61–1.12)

## 2023-12-03 LAB — MAGNESIUM: Magnesium: 1.8 mg/dL (ref 1.7–2.4)

## 2023-12-03 LAB — URINALYSIS, ROUTINE W REFLEX MICROSCOPIC
Bilirubin Urine: NEGATIVE
Glucose, UA: NEGATIVE mg/dL
Hgb urine dipstick: NEGATIVE
Ketones, ur: NEGATIVE mg/dL
Nitrite: NEGATIVE
Protein, ur: 30 mg/dL — AB
Specific Gravity, Urine: 1.025 (ref 1.005–1.030)
pH: 5 (ref 5.0–8.0)

## 2023-12-03 LAB — TROPONIN I (HIGH SENSITIVITY)
Troponin I (High Sensitivity): 33 ng/L — ABNORMAL HIGH (ref <18)
Troponin I (High Sensitivity): 38 ng/L — ABNORMAL HIGH (ref <18)

## 2023-12-03 LAB — PROCALCITONIN: Procalcitonin: 0.21 ng/mL

## 2023-12-03 LAB — TSH: TSH: 1.83 u[IU]/mL (ref 0.350–4.500)

## 2023-12-03 MED ORDER — METOPROLOL SUCCINATE ER 50 MG PO TB24
50.0000 mg | ORAL_TABLET | Freq: Two times a day (BID) | ORAL | Status: DC
  Administered 2023-12-04 – 2023-12-10 (×14): 50 mg via ORAL
  Filled 2023-12-03 (×14): qty 1

## 2023-12-03 MED ORDER — GADOBUTROL 1 MMOL/ML IV SOLN
10.0000 mL | Freq: Once | INTRAVENOUS | Status: AC | PRN
  Administered 2023-12-03: 10 mL via INTRAVENOUS

## 2023-12-03 MED ORDER — APIXABAN 5 MG PO TABS
5.0000 mg | ORAL_TABLET | Freq: Two times a day (BID) | ORAL | Status: DC
  Administered 2023-12-04 – 2023-12-10 (×14): 5 mg via ORAL
  Filled 2023-12-03 (×14): qty 1

## 2023-12-03 MED ORDER — LEVOTHYROXINE SODIUM 50 MCG PO TABS
50.0000 ug | ORAL_TABLET | Freq: Every day | ORAL | Status: DC
  Administered 2023-12-05 – 2023-12-10 (×6): 50 ug via ORAL
  Filled 2023-12-03 (×6): qty 1

## 2023-12-03 MED ORDER — POTASSIUM CHLORIDE CRYS ER 20 MEQ PO TBCR
40.0000 meq | EXTENDED_RELEASE_TABLET | Freq: Once | ORAL | Status: AC
  Administered 2023-12-03: 40 meq via ORAL
  Filled 2023-12-03: qty 2

## 2023-12-03 MED ORDER — ONDANSETRON HCL 4 MG/2ML IJ SOLN: 4.0000 mg | Freq: Three times a day (TID) | INTRAMUSCULAR | Status: DC | PRN

## 2023-12-03 MED ORDER — HYDRALAZINE HCL 20 MG/ML IJ SOLN: 5.0000 mg | INTRAMUSCULAR | Status: DC | PRN

## 2023-12-03 MED ORDER — DILTIAZEM HCL-DEXTROSE 125-5 MG/125ML-% IV SOLN (PREMIX)
5.0000 mg/h | INTRAVENOUS | Status: DC
  Administered 2023-12-04: 5 mg/h via INTRAVENOUS
  Filled 2023-12-03: qty 125

## 2023-12-03 MED ORDER — NYSTATIN 100000 UNIT/GM EX POWD
1.0000 | Freq: Three times a day (TID) | CUTANEOUS | Status: DC
  Administered 2023-12-04 – 2023-12-05 (×2): 1 via TOPICAL
  Filled 2023-12-03 (×2): qty 15

## 2023-12-03 MED ORDER — VITAMIN C 500 MG PO TABS
500.0000 mg | ORAL_TABLET | Freq: Every day | ORAL | Status: DC
  Administered 2023-12-04 – 2023-12-10 (×7): 500 mg via ORAL
  Filled 2023-12-03 (×7): qty 1

## 2023-12-03 MED ORDER — GABAPENTIN 300 MG PO CAPS
300.0000 mg | ORAL_CAPSULE | Freq: Every day | ORAL | Status: DC
  Administered 2023-12-04 – 2023-12-09 (×7): 300 mg via ORAL
  Filled 2023-12-03 (×7): qty 1

## 2023-12-03 MED ORDER — METOPROLOL TARTRATE 5 MG/5ML IV SOLN: 2.5000 mg | INTRAVENOUS | Status: DC | PRN

## 2023-12-03 MED ORDER — METOPROLOL TARTRATE 5 MG/5ML IV SOLN
5.0000 mg | Freq: Once | INTRAVENOUS | Status: AC
  Administered 2023-12-03: 5 mg via INTRAVENOUS
  Filled 2023-12-03: qty 5

## 2023-12-03 MED ORDER — MAGNESIUM SULFATE 2 GM/50ML IV SOLN
2.0000 g | Freq: Once | INTRAVENOUS | Status: AC
  Administered 2023-12-03: 2 g via INTRAVENOUS
  Filled 2023-12-03: qty 50

## 2023-12-03 MED ORDER — POLYETHYLENE GLYCOL 3350 17 G PO PACK
17.0000 g | PACK | Freq: Every day | ORAL | Status: DC | PRN
  Administered 2023-12-04 – 2023-12-05 (×2): 17 g via ORAL
  Filled 2023-12-03 (×2): qty 1

## 2023-12-03 MED ORDER — ACETAMINOPHEN 325 MG PO TABS
650.0000 mg | ORAL_TABLET | Freq: Four times a day (QID) | ORAL | Status: DC | PRN
  Administered 2023-12-05 – 2023-12-10 (×5): 650 mg via ORAL
  Filled 2023-12-03 (×5): qty 2

## 2023-12-03 NOTE — ED Notes (Signed)
 Called CCMD to place patient on monitor

## 2023-12-03 NOTE — ED Provider Notes (Signed)
 [facility] Provider Note    Event Date/Time   First MD Initiated Contact with Patient 12/03/23 1600     (approximate)   History   Weakness   HPI  Rebecca Anthony is a 78 y.o. female  history of hyperlipidemia, permanent A-fib, aortic atherosclerosis, hypertension who presents with 48 hours of bilateral lower extremity weakness and palpitations.  Patient states that she has always had difficulty with her lower extremities and normally gets around with her walker however 2 days ago developed bilateral lower extremity weakness.  She denies any sensation changes or urinary or bowel incontinence but states that she has required extra assistance getting where she needs to go.  She denies any back pain or lower extremity pain.  She reports compliance with all of her medications but has felt increased palpitations over the past 24 hours.  Denies any chest pain or shortness of breath.  No fevers or chills or recent illness     Physical Exam   Triage Vital Signs: ED Triage Vitals  Encounter Vitals Group     BP      Girls Systolic BP Percentile      Girls Diastolic BP Percentile      Boys Systolic BP Percentile      Boys Diastolic BP Percentile      Pulse      Resp      Temp      Temp src      SpO2      Weight      Height      Head Circumference      Peak Flow      Pain Score      Pain Loc      Pain Education      Exclude from Growth Chart     Most recent vital signs: Vitals:   12/03/23 1930 12/03/23 2000  BP: 127/74 (!) 139/92  Pulse: 95 91  Resp: (!) 22 16  Temp:  (!) 97.2 F (36.2 C)  SpO2: 99% 100%    Nursing Triage Note reviewed. Vital signs reviewed and patients oxygen saturation is normoxic  General: Patient is well nourished, well developed, awake and alert, resting comfortably in no acute distress Head: Normocephalic and atraumatic Eyes: Normal inspection, extraocular muscles intact, no conjunctival pallor Ear, nose, throat: Normal  external exam Neck: Normal range of motion Respiratory: Patient is in no respiratory distress, lungs CTAB Cardiovascular: Patient is tachycardic, irregular irregular without murmur appreciated GI: Abd SNT with no guarding or rebound  Back: Normal inspection of the back with good strength and range of motion throughout all ext No CT or L-spine tenderness to palpation Extremities: pulses intact with good cap refills, 1+ LE pitting edema, no calf tenderness Neuro: The patient is alert and oriented to person, place, and time, appropriately conversive, with 5/5 bilat UE strength, patient has 2 out of 5 strength in her lower extremities.  She has full sensation. Skin: Warm, dry, and intact Psych: normal mood and affect, no SI or HI  ED Results / Procedures / Treatments   Labs (all labs ordered are listed, but only abnormal results are displayed) Labs Reviewed  CBC WITH DIFFERENTIAL/PLATELET - Abnormal; Notable for the following components:      Result Value   WBC 10.9 (*)    Hemoglobin 11.2 (*)    HCT 34.8 (*)    Neutro Abs 8.5 (*)    Monocytes Absolute 1.1 (*)    All  other components within normal limits  COMPREHENSIVE METABOLIC PANEL WITH GFR - Abnormal; Notable for the following components:   Potassium 3.1 (*)    CO2 20 (*)    Glucose, Bld 102 (*)    AST 54 (*)    All other components within normal limits  URINALYSIS, ROUTINE W REFLEX MICROSCOPIC - Abnormal; Notable for the following components:   Color, Urine YELLOW (*)    APPearance HAZY (*)    Protein, ur 30 (*)    Leukocytes,Ua TRACE (*)    Bacteria, UA FEW (*)    All other components within normal limits  PHOSPHORUS - Abnormal; Notable for the following components:   Phosphorus 1.9 (*)    All other components within normal limits  TROPONIN I (HIGH SENSITIVITY) - Abnormal; Notable for the following components:   Troponin I (High Sensitivity) 33 (*)    All other components within normal limits  TROPONIN I (HIGH  SENSITIVITY) - Abnormal; Notable for the following components:   Troponin I (High Sensitivity) 38 (*)    All other components within normal limits  URINE CULTURE  CULTURE, BLOOD (ROUTINE X 2)  CULTURE, BLOOD (ROUTINE X 2)  MAGNESIUM   TSH  T4, FREE  PROCALCITONIN  SEDIMENTATION RATE  C-REACTIVE PROTEIN  CORTISOL  BASIC METABOLIC PANEL WITH GFR  CBC  TROPONIN I (HIGH SENSITIVITY)     EKG EKG and rhythm strip are interpreted by myself:   EKG: [tachycardic afib rhythm] at heart rate of 133, normal QRS duration, QTc 463, nonspecific ST segments and T waves no ectopy EKG not consistent with Acute STEMI Rhythm strip: afib with RVR in lead II   RADIOLOGY Xray of chest: No acute abnormality on my independent review interpretation and radiologist agrees MRI lumbar spine with and without contrast    PROCEDURES:  Critical Care performed: No  Procedures   MEDICATIONS ORDERED IN ED: Medications  ondansetron  (ZOFRAN ) injection 4 mg (has no administration in time range)  hydrALAZINE  (APRESOLINE ) injection 5 mg (has no administration in time range)  acetaminophen  (TYLENOL ) tablet 650 mg (has no administration in time range)  metoprolol  tartrate (LOPRESSOR ) injection 2.5 mg (has no administration in time range)  metoprolol  succinate (TOPROL -XL) 24 hr tablet 50 mg (has no administration in time range)  levothyroxine  (SYNTHROID ) tablet 50 mcg (has no administration in time range)  polyethylene glycol (MIRALAX  / GLYCOLAX ) packet 17 g (has no administration in time range)  apixaban  (ELIQUIS ) tablet 5 mg (has no administration in time range)  gabapentin  (NEURONTIN ) capsule 300 mg (has no administration in time range)  ascorbic acid  (VITAMIN C ) tablet 500 mg (has no administration in time range)  nystatin  (MYCOSTATIN /NYSTOP ) topical powder 1 Application (has no administration in time range)  diltiazem  (CARDIZEM ) 125 mg in dextrose  5% 125 mL (1 mg/mL) infusion (has no administration in  time range)  potassium chloride  SA (KLOR-CON  M) CR tablet 40 mEq (40 mEq Oral Given 12/03/23 1733)  metoprolol  tartrate (LOPRESSOR ) injection 5 mg (5 mg Intravenous Given 12/03/23 1733)  magnesium  sulfate IVPB 2 g 50 mL (0 g Intravenous Stopped 12/03/23 2029)  gadobutrol  (GADAVIST ) 1 MMOL/ML injection 10 mL (10 mLs Intravenous Contrast Given 12/03/23 2109)     IMPRESSION / MDM / ASSESSMENT AND PLAN / ED COURSE                                Differential diagnosis includes, but is not limited to, spinal hematoma,  spinal stenosis, subacute cauda equina, transverse myelitis, Guillain-Barr, UTI, electrolyte derangement  ED course: Patient presents with over 48 hours of bilateral lower extremity weakness but no sensation changes and no urinary or bowel incontinence.  Although patient endorses a change 48 hours ago it does seem that it has been declining more chronically over the past week as well when for the last.  She does have decree strength in her lower extremities.  I have ordered an MRI with and without contrast and this is currently pending.  She is not having any urinary retention at this time, and I think acute cauda equina is less likely.  I am concerned about the possibility of Guillain-Barr or transverse myelitis although it is curious that patient denies any back pain or sensation changes in her lower extremities.  She did arrive with A-fib with RVR however this was rate controlled with a dose of metoprolol  IV.  Her magnesium  is borderline low and I will replete this.  Anticipate patient will require admission today, pending MRI spine.   Clinical Course as of 12/03/23 2330  Mon Dec 03, 2023  1636 Hemoglobin(!): 11.2 Not profoundly anemic [HD]  1707 Potassium(!): 3.1 Will replete [HD]  1718 Troponin I (High Sensitivity)(!): 33 Likely secondary to ongoing A-fib with RVR.  Will give a dose of metoprolol  since blood pressure is stable and she is on metoprolol  at baseline [HD]  1718  TSH No thyroid  abnormalities [HD]  1815 DG Chest 2 View enlarge cardiac sillohoute,  but no opacities [HD]  1856 I updated the patient with the plan for MRI 38.  Patient's heart rate currently 98 in the room [HD]  2216 MR Lumbar Spine W Wo Contrast No evidence of spinal cord compression will discuss admission with the hospitalist [HD]  2240 Sed Rate: 13 Not elevated [HD]  2241 Case discussed with hospitalist for admission [HD]    Clinical Course User Index [HD] Nicholaus Rolland BRAVO, MD     FINAL CLINICAL IMPRESSION(S) / ED DIAGNOSES   Final diagnoses:  Weakness of both lower extremities  Atrial fibrillation with RVR (HCC)     Rx / DC Orders   ED Discharge Orders     None        Note:  This document was prepared using Dragon voice recognition software and may include unintentional dictation errors.   Nicholaus Rolland BRAVO, MD 12/03/23 [phone]

## 2023-12-03 NOTE — ED Triage Notes (Signed)
 EMS states they were called out to patient's residence for bilateral leg weakness since yesterday.    Hx A-fib 90-180's CBG 112 Temp 97.8 BP 167/127

## 2023-12-03 NOTE — ED Notes (Signed)
 Patient still unable to provide urine sample, states she will try again.

## 2023-12-03 NOTE — ED Notes (Signed)
 Patient politely refusing In and Out cath; would like to try a purewick. Purewick applied.

## 2023-12-03 NOTE — H&P (Incomplete)
 History and Physical    [name] FMW:[id number] DOB: 1945/10/13 DOA: 12/03/2023  Referring MD/NP/PA:   PCP: Rebecca Mliss FALCON, FNP   Patient coming from:  The patient is coming from home.     Chief Complaint: Bilateral leg weakness and palpitation  HPI: Rebecca Anthony is a 78 y.o. female with medical history significant of A fib on Eliquis , HTN, HLD, pre-DM, hypothyroidism, obesity, scoliosis and spinal stenosis, chronic pain, IBS, diverticulitis, who presents with bilateral leg weakness and palpitation.   Patient states that she has a chronic back problem with difficulty in lower extremity, but normally she can get around using her cane. In the past 2 days, she feels weaker, particularly in both legs, no fall.  She could not get up from a chair without assistance which is not normal to her.  No leg numbness or leg pain.  No loss control of bladder or bowel movement.  No fall or injury. Pt states that she has palpitation over the past 24 hours.  Denies chest pain, SOB, cough, fever or chills.  No nausea, vomiting, diarrhea or abdominal pain.  Denies symptoms of UTI.  Patient was found to have A-fib with RVR, with heart rate 110-150 in ED.   Data reviewed independently and ED Course: pt was found to have WBC 10.9, potassium 3.1, magnesium  1.8, GFR> 60, UA (his appearance, trace amount leukocyte esterase, few bacteria, WBC 0-5), troponin 33 --> 38, procalcitonin 0.21, TSH 1.830, free T4 1.11.  Temperature 97.2, blood pressure 139/92, RR 16-25, chest x-ray negative.  Patient is placed in PCU for observation.  MRI of lumbar spine: 1. At L3-L4, moderate left and mild right foraminal stenosis. Mild left subarticular recess stenosis. 2. At L4-L5, mild left subarticular recess stenosis and mild left foraminal stenosis. 3. At L5-S1, mild left foraminal stenosis. 4. At L2-L3, mild canal stenosis and mild right foraminal stenosis. 5. Rotatory levocurvature.  Grade 1 anterolisthesis of L4 on  L5.    EKG: I have personally reviewed.  A-fib, heart rate of 133, early R wave progression.   Review of Systems:   General: no fevers, chills, no body weight gain, has fatigue HEENT: no blurry vision, hearing changes or sore throat Respiratory: no dyspnea, coughing, wheezing CV: no chest pain, has palpitations GI: no nausea, vomiting, abdominal pain, diarrhea, constipation GU: no dysuria, burning on urination, increased urinary frequency, hematuria  Ext: no leg edema Neuro: no unilateral numbness, or tingling, no vision change. Has chronic hearing loss.  Skin: no rash, no skin tear. MSK: No muscle spasm, no deformity, no limitation of range of movement in spin. Has bilateral leg weakness Heme: No easy bruising.  Travel history: No recent long distant travel.   Allergy:  Allergies  Allergen Reactions   Anaprox [Naproxen Sodium] Rash    Past Medical History:  Diagnosis Date   Chronic abdominal pain (LLQ) (Primary Area of Pain) (Left) 10/31/2017   Chronic ankle pain (Fourth Area of Pain) (Left) 11/07/2017   Chronic low back pain (Secondary Area of Pain) (Bilateral) (L>R) 10/31/2017   Chronic upper back pain [facility] Area of Pain) (Bilateral) (L>R) 10/31/2017   Diverticulitis    Diverticulosis    Dysuria    Heart murmur    Hematuria, unspecified    History of shingles    History of swelling of feet    Hypercholesterolemia    Hypertension    Hypothyroidism    Irritable bowel syndrome    Lichenification and lichen simplex chronicus  Localized superficial swelling, mass, or lump    Onychomycosis 03/23/2016   Osteoarthrosis, unspecified whether generalized or localized, unspecified site    Osteoporosis, unspecified    PAF (paroxysmal atrial fibrillation) (HCC)    Diagnosed years ago -- originally on Quinidine   Sigmoid diverticulosis 11/28/2017   Sinus problem    Synovial cyst, unspecified    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    Past Surgical History:  Procedure  Laterality Date   ABDOMINAL HYSTERECTOMY     ANKLE SURGERY Left    Torned tendon   APPENDECTOMY     AV FISTULA REPAIR     BUNIONECTOMY     CATARACT EXTRACTION W/PHACO Right 01/02/2019   Procedure: CATARACT EXTRACTION PHACO AND INTRAOCULAR LENS PLACEMENT (IOC) RIGHT;  Surgeon: Rebecca Fallow, MD;  Location: [facility] ORS;  Service: Ophthalmology;  Laterality: Right;  US  00:42 CDE 7.91 fluid pack lot # [id number]   CATARACT EXTRACTION W/PHACO Left 02/07/2019   Procedure: CATARACT EXTRACTION PHACO AND INTRAOCULAR LENS PLACEMENT (IOC) LEFT;  Surgeon: Rebecca Fallow, MD;  Location: [facility] ORS;  Service: Ophthalmology;  Laterality: Left;  US  00:36.3CDE 5.01Fluid Pack Lot # [id number]   COLONOSCOPY     FOOT SURGERY Left    VAGINAL HYSTERECTOMY     VESICOVAGINAL FISTULA CLOSURE W/ TAH      Social History:  reports that she has never smoked. She has never used smokeless tobacco. She reports that she does not drink alcohol and does not use drugs.  Family History:  Family History  Problem Relation Age of Onset   Diabetes Mother    Hypertension Mother    Heart failure Mother    Heart disease Father    Hypertension Father    Thyroid  disease Sister    Healthy Sister    Breast cancer Neg Hx      Prior to Admission medications   Medication Sig Start Date End Date Taking? Authorizing Provider  acetaminophen  (TYLENOL  8 HOUR ARTHRITIS PAIN) 650 MG CR tablet Take 650 mg by mouth every 8 (eight) hours as needed for pain.   Yes [provider]  calcium  carbonate (OSCAL) 1500 (600 Ca) MG TABS tablet Take by mouth.    Yes [provider]  ELIQUIS  5 MG TABS tablet TAKE 1 TABLET BY MOUTH TWICE A DAY 11/19/23  Yes Anthony, Rebecca J, MD  gabapentin  (NEURONTIN ) 300 MG capsule TAKE 1 CAPSULE BY MOUTH EVERYDAY AT BEDTIME 10/28/23  Yes Anthony, Rebecca T, DPM  HORSE CHESTNUT PO Take 1 tablet by mouth daily.   Yes [provider]  levothyroxine  (SYNTHROID ) 50 MCG tablet TAKE 1 TABLET BY MOUTH  EVERY DAY BEFORE BREAKFAST 07/17/23  Yes Anthony, Rebecca F, FNP  methocarbamol  (ROBAXIN ) 750 MG tablet Take 1 tablet (750 mg total) by mouth 2 (two) times daily as needed for muscle spasms. 06/08/22  Yes Anthony, Rebecca F, FNP  metoprolol  succinate (TOPROL -XL) 50 MG 24 hr tablet TAKE 1 TABLET (50 MG TOTAL) BY MOUTH TWICE A DAY .TAKE WITH OR IMMEDIATELY FOLLOWING A MEAL 10/25/23  Yes Anthony, Rebecca J, MD  nystatin  (MYCOSTATIN /NYSTOP ) powder Apply 1 Application topically 3 (three) times daily. 04/13/23  Yes Anthony, Rebecca F, FNP  polyethylene glycol powder (GLYCOLAX /MIRALAX ) powder Take 17 g by mouth daily. Dissolve 1 capful in 8 oz of liquid daily PRN Patient taking differently: Take 17 g by mouth daily as needed (constipation.). 03/26/18  Yes Lada, Newell SQUIBB, MD  telmisartan  (MICARDIS ) 40 MG tablet Take 1 tablet (40 mg total) by mouth daily. 07/09/23  Yes Furth, Cadence H, PA-C  vitamin C  (ASCORBIC ACID ) 500 MG tablet Take 500 mg by mouth daily.   Yes [provider]  VITAMIN D , CHOLECALCIFEROL, PO    Yes [provider]    Physical Exam: Vitals:   12/03/23 1730 12/03/23 1830 12/03/23 1930 12/03/23 2000  BP: (!) 146/97 (!) 147/100 127/74 (!) 139/92  Pulse: (!) 111 90 95 91  Resp: 18 (!) 22 (!) 22 16  Temp:    (!) 97.2 F (36.2 C)  TempSrc:    Oral  SpO2: 100% 100% 99% 100%   General: Not in acute distress HEENT:       Eyes: PERRL, EOMI, no jaundice       ENT: No discharge from the ears and nose, no pharynx injection, no tonsillar enlargement.        Neck: No JVD, no bruit, no mass felt. Heme: No neck lymph node enlargement. Cardiac: S1/S2, irregularly irregular rhythm, No gallops or rubs. Respiratory: No rales, wheezing, rhonchi or rubs. GI: Soft, nondistended, nontender, no rebound pain, no organomegaly, BS present. GU: No hematuria Ext: No pitting leg edema bilaterally. 1+DP/PT pulse bilaterally. Musculoskeletal: No joint deformities, No joint redness or warmth, no  limitation of ROM in spin. Skin: No rashes.  Neuro: Alert, oriented X3, cranial nerves II-XII grossly intact except for hearing loss,, moves all extremities normally.  Patient has symmetric bilateral leg weakness with muscle strength 3/5 in both legs.  Muscle strength is normal in both arms. Sensation to light touch intact. Knee reflex 2+ bilaterally.  Psych: Patient is not psychotic, no suicidal or hemocidal ideation.  Labs on Admission: I have personally reviewed following labs and imaging studies  CBC: Recent Labs  Lab 12/03/23 1609  WBC 10.9*  NEUTROABS 8.5*  HGB 11.2*  HCT 34.8*  MCV 86.8  PLT 253   Basic Metabolic Panel: Recent Labs  Lab 12/03/23 1609  NA 137  K 3.1*  CL 107  CO2 20*  GLUCOSE 102*  BUN 20  CREATININE 0.65  CALCIUM  9.2  MG 1.8  PHOS 1.9*   GFR: CrCl cannot be calculated (Unknown ideal weight.). Liver Function Tests: Recent Labs  Lab 12/03/23 1609  AST 54*  ALT 22  ALKPHOS 62  BILITOT 1.1  PROT 6.9  ALBUMIN 3.8   No results for input(s): LIPASE, AMYLASE in the last 168 hours. No results for input(s): AMMONIA in the last 168 hours. Coagulation Profile: No results for input(s): INR, PROTIME in the last 168 hours. Cardiac Enzymes: No results for input(s): CKTOTAL, CKMB, CKMBINDEX, TROPONINI in the last 168 hours. BNP (last 3 results) No results for input(s): PROBNP in the last 8760 hours. HbA1C: No results for input(s): HGBA1C in the last 72 hours. CBG: No results for input(s): GLUCAP in the last 168 hours. Lipid Profile: No results for input(s): CHOL, HDL, LDLCALC, TRIG, CHOLHDL, LDLDIRECT in the last 72 hours. Thyroid  Function Tests: Recent Labs    12/03/23 1609  TSH 1.830  FREET4 1.11   Anemia Panel: No results for input(s): VITAMINB12, FOLATE, FERRITIN, TIBC, IRON, RETICCTPCT in the last 72 hours. Urine analysis:    Component Value Date/Time   COLORURINE YELLOW (A) 12/03/2023  1609   APPEARANCEUR HAZY (A) 12/03/2023 1609   APPEARANCEUR Clear 08/23/2017 1354   LABSPEC 1.025 12/03/2023 1609   PHURINE 5.0 12/03/2023 1609   GLUCOSEU NEGATIVE 12/03/2023 1609   HGBUR NEGATIVE 12/03/2023 1609   BILIRUBINUR NEGATIVE 12/03/2023 1609   BILIRUBINUR negative 03/01/2022 1538  BILIRUBINUR Negative 08/23/2017 1354   KETONESUR NEGATIVE 12/03/2023 1609   PROTEINUR 30 (A) 12/03/2023 1609   UROBILINOGEN 0.2 03/01/2022 1538   NITRITE NEGATIVE 12/03/2023 1609   LEUKOCYTESUR TRACE (A) 12/03/2023 1609   Sepsis Labs: @LABRCNTIP (procalcitonin:4,lacticidven:4) )No results found for this or any previous visit (from the past 240 hours).   Radiological Exams on Admission:   Assessment/Plan Principal Problem:   Atrial fibrillation with RVR (HCC) Active Problems:   Myocardial injury   Hypothermia   Leukocytosis   Hypokalemia   Hypophosphatemia   Essential hypertension   Hypothyroidism   Bilateral leg weakness   Obesity (BMI 30.0-34.9)   Assessment and Plan:   Atrial fibrillation with RVR (HCC): Heart rate 110-150 in ED. TSH and free T4 normal.  Magnesium  1.8, potassium 6.1.  -Placed in PCU for observation - Continue home metoprolol  50 mg twice daily - Started Cardizem  drip  -As needed IV metoprolol  2.5 mg q2h for HR > 125 - Continue home Eliquis  - Give 2 g magnesium  sulfate to keep magnesium  above 2.0  Myocardial injury: trop  33 --> 38 --> 28. No CP.  Due to demand ischemia. -Will not give aspirin  since patient is on Eliquis   Hypothermia: Temperature 97.2 -Bair hugger - Check cortisol level  Leukocytosis: WBC 10.9, no source infection identified.  UA not impressive.  Patient denies symptoms of UTI.  Procalcitonin 0.21, hold off antibiotics now. -Follow-up blood culture and urine culture  Hypokalemia and hypophosphatemia: Potassium 3.1: Phosphorus 1.9, magnesium  1.8. - Replete phosphorus and potassium  Essential hypertension -IV hydralazine  as needed -  Metoprolol  - Temporarily hold Micardis  since patient will be Cardizem  drip  Hypothyroidism: TSH and free T4 normal -Continue Synthroid   Bilateral leg weakness: Likely multifactorial etiology, including A-fib with RVR, electrolyte disturbance, low suspicions for Guillain-Barr syndrome since patient has normal knee reflex.  MRI of lumbar spine showed multilevel of stenosis as above, but no acute spine cord compression. - PT/OT - TOC consult for home health need - Fall precaution  Obesity (BMI 30.0-34.9):  Patient has Obesity Class I, with body weight 103.9 Kg and BMI 33.63 kg/m2.  - Encourage losing weight - Exercise and healthy diet      DVT ppx: on Eliquis   Code Status: Full code   Family Communication:     not done, no family member is at bed side.    Disposition Plan:  Anticipate discharge back to previous environment  Consults called:  none  Admission status and Level of care: Progressive:    for obs     Dispo: The patient is from: Home              Anticipated d/c is to: Home              Anticipated d/c date is: 1 day              Patient currently is not medically stable to d/c.    Severity of Illness:  The appropriate patient status for this patient is OBSERVATION. Observation status is judged to be reasonable and necessary in order to provide the required intensity of service to ensure the patient's safety. The patient's presenting symptoms, physical exam findings, and initial radiographic and laboratory data in the context of their medical condition is felt to place them at decreased risk for further clinical deterioration. Furthermore, it is anticipated that the patient will be medically stable for discharge from the hospital within 2 midnights of admission.  Date of Service 12/04/2023    Caleb Exon [facility]   If 7PM-7AM, please contact night-coverage [email or web address] 12/04/2023, 1:14 AM

## 2023-12-04 ENCOUNTER — Encounter: Payer: Self-pay | Admitting: Internal Medicine

## 2023-12-04 ENCOUNTER — Observation Stay

## 2023-12-04 DIAGNOSIS — Z8249 Family history of ischemic heart disease and other diseases of the circulatory system: Secondary | ICD-10-CM | POA: Diagnosis not present

## 2023-12-04 DIAGNOSIS — D72829 Elevated white blood cell count, unspecified: Secondary | ICD-10-CM | POA: Diagnosis present

## 2023-12-04 DIAGNOSIS — E876 Hypokalemia: Secondary | ICD-10-CM | POA: Diagnosis present

## 2023-12-04 DIAGNOSIS — I2489 Other forms of acute ischemic heart disease: Secondary | ICD-10-CM | POA: Diagnosis present

## 2023-12-04 DIAGNOSIS — Z79890 Hormone replacement therapy (postmenopausal): Secondary | ICD-10-CM | POA: Diagnosis not present

## 2023-12-04 DIAGNOSIS — Z7901 Long term (current) use of anticoagulants: Secondary | ICD-10-CM | POA: Diagnosis not present

## 2023-12-04 DIAGNOSIS — I10 Essential (primary) hypertension: Secondary | ICD-10-CM | POA: Diagnosis present

## 2023-12-04 DIAGNOSIS — E7800 Pure hypercholesterolemia, unspecified: Secondary | ICD-10-CM | POA: Diagnosis present

## 2023-12-04 DIAGNOSIS — I5A Non-ischemic myocardial injury (non-traumatic): Secondary | ICD-10-CM | POA: Diagnosis present

## 2023-12-04 DIAGNOSIS — T68XXXS Hypothermia, sequela: Secondary | ICD-10-CM

## 2023-12-04 DIAGNOSIS — R531 Weakness: Secondary | ICD-10-CM

## 2023-12-04 DIAGNOSIS — H9190 Unspecified hearing loss, unspecified ear: Secondary | ICD-10-CM | POA: Diagnosis present

## 2023-12-04 DIAGNOSIS — M4316 Spondylolisthesis, lumbar region: Secondary | ICD-10-CM | POA: Diagnosis present

## 2023-12-04 DIAGNOSIS — R680 Hypothermia, not associated with low environmental temperature: Secondary | ICD-10-CM | POA: Diagnosis present

## 2023-12-04 DIAGNOSIS — Z8349 Family history of other endocrine, nutritional and metabolic diseases: Secondary | ICD-10-CM | POA: Diagnosis not present

## 2023-12-04 DIAGNOSIS — Z751 Person awaiting admission to adequate facility elsewhere: Secondary | ICD-10-CM | POA: Diagnosis not present

## 2023-12-04 DIAGNOSIS — I4821 Permanent atrial fibrillation: Secondary | ICD-10-CM | POA: Diagnosis present

## 2023-12-04 DIAGNOSIS — Z90710 Acquired absence of both cervix and uterus: Secondary | ICD-10-CM | POA: Diagnosis not present

## 2023-12-04 DIAGNOSIS — M810 Age-related osteoporosis without current pathological fracture: Secondary | ICD-10-CM | POA: Diagnosis present

## 2023-12-04 DIAGNOSIS — Z6833 Body mass index (BMI) 33.0-33.9, adult: Secondary | ICD-10-CM | POA: Diagnosis not present

## 2023-12-04 DIAGNOSIS — E039 Hypothyroidism, unspecified: Secondary | ICD-10-CM | POA: Diagnosis present

## 2023-12-04 DIAGNOSIS — I4891 Unspecified atrial fibrillation: Secondary | ICD-10-CM | POA: Diagnosis present

## 2023-12-04 DIAGNOSIS — R29898 Other symptoms and signs involving the musculoskeletal system: Secondary | ICD-10-CM | POA: Diagnosis not present

## 2023-12-04 DIAGNOSIS — Z79899 Other long term (current) drug therapy: Secondary | ICD-10-CM | POA: Diagnosis not present

## 2023-12-04 DIAGNOSIS — M48061 Spinal stenosis, lumbar region without neurogenic claudication: Secondary | ICD-10-CM | POA: Diagnosis present

## 2023-12-04 DIAGNOSIS — Z8619 Personal history of other infectious and parasitic diseases: Secondary | ICD-10-CM | POA: Diagnosis not present

## 2023-12-04 DIAGNOSIS — Z833 Family history of diabetes mellitus: Secondary | ICD-10-CM | POA: Diagnosis not present

## 2023-12-04 LAB — TROPONIN I (HIGH SENSITIVITY): Troponin I (High Sensitivity): 28 ng/L — ABNORMAL HIGH (ref <18)

## 2023-12-04 LAB — CBC
HCT: 36.3 % (ref 36.0–46.0)
Hemoglobin: 11.6 g/dL — ABNORMAL LOW (ref 12.0–15.0)
MCH: 28 pg (ref 26.0–34.0)
MCHC: 32 g/dL (ref 30.0–36.0)
MCV: 87.7 fL (ref 80.0–100.0)
Platelets: 262 K/uL (ref 150–400)
RBC: 4.14 MIL/uL (ref 3.87–5.11)
RDW: 13 % (ref 11.5–15.5)
WBC: 8.1 K/uL (ref 4.0–10.5)
nRBC: 0 % (ref 0.0–0.2)

## 2023-12-04 LAB — CORTISOL: Cortisol, Plasma: 17.4 ug/dL

## 2023-12-04 LAB — BASIC METABOLIC PANEL WITH GFR
Anion gap: 8 (ref 5–15)
BUN: 19 mg/dL (ref 8–23)
CO2: 23 mmol/L (ref 22–32)
Calcium: 9 mg/dL (ref 8.9–10.3)
Chloride: 108 mmol/L (ref 98–111)
Creatinine, Ser: 0.62 mg/dL (ref 0.44–1.00)
GFR, Estimated: >60 mL/min (ref >60)
Glucose, Bld: 117 mg/dL — ABNORMAL HIGH (ref 70–99)
Potassium: 4.4 mmol/L (ref 3.5–5.1)
Sodium: 139 mmol/L (ref 135–145)

## 2023-12-04 LAB — C-REACTIVE PROTEIN: CRP: 6.4 mg/dL — ABNORMAL HIGH (ref <1.0)

## 2023-12-04 LAB — PHOSPHORUS: Phosphorus: 3.5 mg/dL (ref 2.5–4.6)

## 2023-12-04 MED ORDER — METHOCARBAMOL 500 MG PO TABS
750.0000 mg | ORAL_TABLET | Freq: Three times a day (TID) | ORAL | Status: DC | PRN
  Administered 2023-12-10 (×2): 750 mg via ORAL
  Filled 2023-12-04 (×2): qty 2

## 2023-12-04 MED ORDER — POTASSIUM PHOSPHATES 15 MMOLE/5ML IV SOLN
15.0000 mmol | Freq: Once | INTRAVENOUS | Status: AC
  Administered 2023-12-04: 15 mmol via INTRAVENOUS
  Filled 2023-12-04: qty 5

## 2023-12-04 MED ORDER — SODIUM CHLORIDE 0.9 % IV BOLUS
250.0000 mL | Freq: Once | INTRAVENOUS | Status: AC
  Administered 2023-12-04: 250 mL via INTRAVENOUS

## 2023-12-04 MED ORDER — DILTIAZEM HCL 30 MG PO TABS
30.0000 mg | ORAL_TABLET | Freq: Three times a day (TID) | ORAL | Status: DC
  Administered 2023-12-04 – 2023-12-10 (×19): 30 mg via ORAL
  Filled 2023-12-04 (×19): qty 1

## 2023-12-04 NOTE — ED Notes (Signed)
 Pts HR noted to maintain between 70-80 bpm. Pt also noted to have BP of 90/60. Dr. Lawence messaged and made aware of same via secure chat.

## 2023-12-04 NOTE — Evaluation (Signed)
 Occupational Therapy Evaluation Patient Details Name: Rebecca Anthony MRN: [id number] DOB: 25-Nov-1945 Today's Date: 12/04/2023   History of Present Illness   Rebecca Anthony is a 78 y.o. female with medical history significant of A fib on Eliquis , HTN, HLD, pre-DM, hypothyroidism, obesity, scoliosis and spinal stenosis, chronic pain, IBS, diverticulitis, who presents with bilateral leg weakness and palpitation.     Clinical Impressions Rebecca Anthony was seen for OT evaluation this date. Prior to hospital admission, Rebecca Anthony was generally independent with ADL management. Per chart, she was using a SPC for functional mobility. Rebecca Anthony declines needing assistance with dressing, bathing, or toileting at baseline. Rebecca Anthony lives with her spouse in a 1 level home with ~3 STE. Rebecca Anthony presents with deficits in strength, activity tolerance, balance, and safety awareness affecting safe and optimal ADL completion. Rebecca Anthony currently requires MOD A for bed mobility, MOD A +2 for STS t/fs with RW, and is unable to take steps despite +2 MAX A for support.  Rebecca Anthony would benefit from skilled OT services to address noted impairments and functional limitations (see below for any additional details) in order to maximize safety and independence while minimizing future risk of falls, injury, and readmission. Anticipate the need for follow up OT services upon acute hospital DC.      If plan is discharge home, recommend the following:   Two people to help with walking and/or transfers;A lot of help with bathing/dressing/bathroom;Assist for transportation;Help with stairs or ramp for entrance;Assistance with cooking/housework;Supervision due to cognitive status     Functional Status Assessment   Patient has had a recent decline in their functional status and demonstrates the ability to make significant improvements in function in a reasonable and predictable amount of time.     Equipment Recommendations   BSC/3in1     Recommendations for  Other Services         Precautions/Restrictions   Precautions Precautions: Fall Restrictions Weight Bearing Restrictions Per Provider Order: No     Mobility Bed Mobility Overal bed mobility: Needs Assistance Bed Mobility: Supine to Sit, Sit to Supine     Supine to sit: Contact guard, HOB elevated, Used rails Sit to supine: Mod assist, Used rails, HOB elevated        Transfers Overall transfer level: Needs assistance Equipment used: Rolling walker (2 wheels) Transfers: Sit to/from Stand Sit to Stand: Mod assist, +2 physical assistance, Max assist           General transfer comment: Difficulty coming to full stand. Heavy assist to attempt side steps toward [facility], unable to shift weight due to bilat knee buckling.      Balance Overall balance assessment: Needs assistance Sitting-balance support: Feet supported, No upper extremity supported Sitting balance-Leahy Scale: Good     Standing balance support: Reliant on assistive device for balance, Bilateral upper extremity supported Standing balance-Leahy Scale: Poor                             ADL either performed or assessed with clinical judgement   ADL Overall ADL's : Needs assistance/impaired                                       General ADL Comments: MOD A for bed mobiliyt, Mod A +2 for STS t/fs, Rebecca Anthony with bilat knee buckling noted in standing. Rebecca Anthony with heavy posterior lean onto  bed with back of legs. Has difficulty coming to full stand. Is able to scoot laterally toward [facility] with increased time/effort to perform but no physical assist. Able to don bilat socks with SUPERVISION for safety and increased time/effort to perform.     Vision Baseline Vision/History: 1 Wears glasses Ability to See in Adequate Light: 1 Impaired Patient Visual Report: No change from baseline       Perception         Praxis         Pertinent Vitals/Pain Pain Assessment Pain Assessment: No/denies pain      Extremity/Trunk Assessment Upper Extremity Assessment Upper Extremity Assessment: Generalized weakness   Lower Extremity Assessment Lower Extremity Assessment: Generalized weakness   Cervical / Trunk Assessment Cervical / Trunk Assessment: Normal   Communication Communication Communication: No apparent difficulties Factors Affecting Communication: Hearing impaired   Cognition Arousal: Alert Behavior During Therapy: Lability, WFL for tasks assessed/performed Cognition: No family/caregiver present to determine baseline             OT - Cognition Comments: Rebecca Anthony has some difficulty with awareness of deficits and safety awareness during session. Requires re-direction consistently to complete single step tasks. No family at bedside to determine baseline cognition. Will continue to monitor.                 Following commands: Intact       Cueing  General Comments   Cueing Techniques: Verbal cues;Gestural cues;Tactile cues  VS monitored during session. HR noted to reach 174 with standing attempt. Decreases to low 100's quickly with therapeutic rest break. Rebecca Anthony denies adverse s/s, but does appear SOB with mobility attempts. spO2 WNL on RA.   Exercises Other Exercises Other Exercises: Rebecca Anthony educated on role of OT in acute setting, safety, falls prevention strateagies, safe use of AE/DME for ADL management and extended time taken to discuss DC recs. Will benefit from reinforcement.   Shoulder Instructions      Home Living Family/patient expects to be discharged to:: Private residence Living Arrangements: Spouse/significant other Available Help at Discharge: Family;Available PRN/intermittently Type of Home: House Home Access: Stairs to enter [employer] of Steps: 3 at garage   Home Layout: One level     Bathroom Shower/Tub: Chief Strategy Officer: Standard     Home Equipment: Agricultural consultant (2 wheels);Cane - single point          Prior  Functioning/Environment Prior Level of Function : Independent/Modified Independent             Mobility Comments: Was amb with SPC prior to admission. Denies additional falls history in past 6 months. ADLs Comments: Rebecca Anthony reports she was independent for bathing and dressing PTA. Reports spouse has been assisting lately 2/2 increased weakness in BLE.    OT Problem List: Decreased strength;Decreased coordination;Cardiopulmonary status limiting activity;Decreased safety awareness;Decreased knowledge of use of DME or AE;Impaired balance (sitting and/or standing)   OT Treatment/Interventions: Self-care/ADL training;Therapeutic exercise;Therapeutic activities;DME and/or AE instruction;Patient/family education;Balance training;Energy conservation;Cognitive remediation/compensation      OT Goals(Current goals can be found in the care plan section)   Acute Rehab OT Goals Patient Stated Goal: To go home OT Goal Formulation: With patient Time For Goal Achievement: 12/18/23 Potential to Achieve Goals: Good ADL Goals Rebecca Anthony Will Perform Grooming: sitting;with modified independence Rebecca Anthony Will Perform Lower Body Dressing: with adaptive equipment;sit to/from stand;with min assist Rebecca Anthony Will Transfer to Toilet: bedside commode;stand pivot transfer;with min assist Rebecca Anthony Will Perform Toileting - Clothing Manipulation  and hygiene: sitting/lateral leans;sit to/from stand;with min assist;with adaptive equipment   OT Frequency:  Min 2X/week    Co-evaluation              AM-PAC OT 6 Clicks Daily Activity     Outcome Measure Help from another person eating meals?: None Help from another person taking care of personal grooming?: A Little Help from another person toileting, which includes using toliet, bedpan, or urinal?: A Lot Help from another person bathing (including washing, rinsing, drying)?: A Lot Help from another person to put on and taking off regular upper body clothing?: A Little Help from another  person to put on and taking off regular lower body clothing?: A Lot 6 Click Score: 16   End of Session Equipment Utilized During Treatment: Gait belt;Rolling walker (2 wheels) Nurse Communication: Mobility status  Activity Tolerance: Patient tolerated treatment well Patient left: in bed;with call bell/phone within reach;with bed alarm set  OT Visit Diagnosis: Other abnormalities of gait and mobility (R26.89);Muscle weakness (generalized) (M62.81)                Time: 8968-8885 OT Time Calculation (min): 43 min Charges:  OT General Charges $OT Visit: 1 Visit OT Evaluation $OT Eval Moderate Complexity: 1 Mod OT Treatments $Self Care/Home Management : 23-37 mins  Jhonny Pelton, M.S., OTR/L 12/04/23, 2:47 PM

## 2023-12-04 NOTE — Progress Notes (Signed)
 Progress Note   Patient: Rebecca Anthony FMW:[id number] DOB: 06-03-1945 DOA: 12/03/2023     0 DOS: the patient was seen and examined on 12/04/2023   Brief hospital course: 78 y.o. female with medical history significant of A fib on Eliquis , HTN, HLD, pre-DM, hypothyroidism, obesity, scoliosis and spinal stenosis, chronic pain, IBS, diverticulitis, who presents with bilateral leg weakness and palpitation.     Patient states that she has a chronic back problem with difficulty in lower extremity, but normally she can get around using her cane. In the past 2 days, she feels weaker, particularly in both legs, no fall.  She could not get up from a chair without assistance which is not normal to her.  No leg numbness or leg pain.  No loss control of bladder or bowel movement.  No fall or injury. Pt states that she has palpitation over the past 24 hours.  Denies chest pain, SOB, cough, fever or chills.  No nausea, vomiting, diarrhea or abdominal pain.  Denies symptoms of UTI.   Patient was found to have A-fib with RVR, with heart rate 110-150 in ED.  8/19.  Patient states that her knees give out when she is trying to walk complains of weakness.  Also found to be in rapid atrial fibrillation.  Cardizem  drip stopped secondary to low blood pressure.  Started on low-dose oral Cardizem .  Assessment and Plan: * Atrial fibrillation with RVR (HCC) Continue metoprolol  XL 50 mg twice a day.  Cardizem  drip stopped secondary to low blood pressure.  Low-dose oral Cardizem  3 times daily.  Continue Eliquis  for anticoagulation.  Heart rate better controlled this afternoon than this morning.  Need to see what happens when she moves around.  Generalized weakness Patient states that her knees gave out when she walks.  Patient able to straight leg raise.  Will start off with a knee x-ray.  Check uric acid.  PT and OT evaluations.  With OT, it took 2 people to get her up.  Leukocytosis Normalized  Hypothermia Last  temperature normal.  Urinalysis negative.  Chest x-ray negative.  Hypophosphatemia Replaced  Hypokalemia Replaced  Essential hypertension Continue Toprol -XL and low-dose Cardizem   Hypothyroidism On Synthroid   Myocardial injury Troponin trending down from 38 to 28.  Bilateral leg weakness MRI of the lumbar spine does not explain this.  Obesity (BMI 30.0-34.9) Class I obesity with a BMI of 32.7        Subjective: Patient states she is coming in with weakness.  She states her knees give out when she tries to walk.  Also found to have atrial fibrillation with rapid ventricular rate.  Physical Exam: Vitals:   12/04/23 1500 12/04/23 1530 12/04/23 1608 12/04/23 1619  BP: 111/75 121/80 (!) 155/98   Pulse: 80 73 82   Resp: (!) 23 (!) 23 18   Temp:   98.6 F (37 C)   TempSrc:   Oral   SpO2: 100% 100% 100%   Weight:    100.7 kg  Height:    5' 9 (1.753 m)   Physical Exam HENT:     Head: Normocephalic.     Mouth/Throat:     Pharynx: No oropharyngeal exudate.  Eyes:     General: Lids are normal.     Conjunctiva/sclera: Conjunctivae normal.  Cardiovascular:     Rate and Rhythm: Normal rate and regular rhythm.     Heart sounds: Normal heart sounds, S1 normal and S2 normal.  Pulmonary:     Breath sounds:  No decreased breath sounds, wheezing, rhonchi or rales.  Abdominal:     Palpations: Abdomen is soft.     Tenderness: There is no abdominal tenderness.  Musculoskeletal:     Right knee: Decreased range of motion.     Left knee: Decreased range of motion.  Skin:    General: Skin is warm.     Findings: No rash.  Neurological:     Mental Status: She is alert and oriented to person, place, and time.     Comments: Able to straight leg raise.     Data Reviewed: MRI lumbar spine reviewed Creatinine 0.62, potassium 4.4, phosphorus 3.5, white blood cell count 8.1, hemoglobin 11.6, platelet count 262, CRP 6.4, last troponin 28  Family Communication: Left message for  husband  Disposition: Status is: Switched to inpatient.  Need to see how far she is able to walk with physical therapy.  Planned Discharge Destination: Rehab    Time spent: 28 minutes  Author: Charlie Patterson, MD 12/04/2023 5:38 PM  For on call review [email or web address].

## 2023-12-04 NOTE — Assessment & Plan Note (Addendum)
 Continue metoprolol  XL 50 mg twice a day.  Cardizem  drip stopped secondary to low blood pressure.  Low-dose oral Cardizem  3 times daily.  Continue Eliquis  for anticoagulation.  Heart rate better controlled this afternoon than this morning.  Need to see what happens when she moves around.

## 2023-12-04 NOTE — Assessment & Plan Note (Signed)
 Class I obesity with a BMI of 32.7

## 2023-12-04 NOTE — Assessment & Plan Note (Addendum)
 Patient states that her knees gave out when she walks.  Patient able to straight leg raise.  Will start off with a knee x-ray.  Check uric acid.  PT and OT evaluations.  With OT, it took 2 people to get her up.

## 2023-12-04 NOTE — Assessment & Plan Note (Signed)
 MRI of the lumbar spine does not explain this.

## 2023-12-04 NOTE — Assessment & Plan Note (Signed)
 Last temperature normal.  Urinalysis negative.  Chest x-ray negative.

## 2023-12-04 NOTE — Assessment & Plan Note (Signed)
 On Synthroid

## 2023-12-04 NOTE — Assessment & Plan Note (Signed)
 Continue Toprol -XL and low-dose Cardizem 

## 2023-12-04 NOTE — Assessment & Plan Note (Signed)
 Replaced

## 2023-12-04 NOTE — Plan of Care (Signed)
  Problem: Pain Managment: Goal: General experience of comfort will improve and/or be controlled Outcome: Progressing   Problem: Safety: Goal: Ability to remain free from injury will improve Outcome: Progressing   Problem: Skin Integrity: Goal: Risk for impaired skin integrity will decrease Outcome: Progressing

## 2023-12-04 NOTE — ED Notes (Signed)
 This tech and Delon, Charity fundraiser, helped this pt clean up after she missed the bed pan. Pt clean and dry, call light in reach.

## 2023-12-04 NOTE — Hospital Course (Signed)
 78 y.o. female with medical history significant of A fib on Eliquis , HTN, HLD, pre-DM, hypothyroidism, obesity, scoliosis and spinal stenosis, chronic pain, IBS, diverticulitis, who presents with bilateral leg weakness and palpitation.     Patient states that she has a chronic back problem with difficulty in lower extremity, but normally she can get around using her cane. In the past 2 days, she feels weaker, particularly in both legs, no fall.  She could not get up from a chair without assistance which is not normal to her.  No leg numbness or leg pain.  No loss control of bladder or bowel movement.  No fall or injury. Pt states that she has palpitation over the past 24 hours.  Denies chest pain, SOB, cough, fever or chills.  No nausea, vomiting, diarrhea or abdominal pain.  Denies symptoms of UTI.   Patient was found to have A-fib with RVR, with heart rate 110-150 in ED.  8/19.  Patient states that her knees give out when she is trying to walk complains of weakness.  Also found to be in rapid atrial fibrillation.  Cardizem  drip stopped secondary to low blood pressure.  Started on low-dose oral Cardizem .

## 2023-12-04 NOTE — Assessment & Plan Note (Signed)
 Normalized

## 2023-12-04 NOTE — Assessment & Plan Note (Signed)
 Troponin trending down from 38 to 28.

## 2023-12-05 DIAGNOSIS — I4891 Unspecified atrial fibrillation: Secondary | ICD-10-CM | POA: Diagnosis not present

## 2023-12-05 LAB — BASIC METABOLIC PANEL WITH GFR
Anion gap: 7 (ref 5–15)
BUN: 14 mg/dL (ref 8–23)
CO2: 24 mmol/L (ref 22–32)
Calcium: 9 mg/dL (ref 8.9–10.3)
Chloride: 107 mmol/L (ref 98–111)
Creatinine, Ser: 0.62 mg/dL (ref 0.44–1.00)
GFR, Estimated: >60 mL/min (ref >60)
Glucose, Bld: 102 mg/dL — ABNORMAL HIGH (ref 70–99)
Potassium: 4.3 mmol/L (ref 3.5–5.1)
Sodium: 138 mmol/L (ref 135–145)

## 2023-12-05 LAB — URINE CULTURE

## 2023-12-05 LAB — URIC ACID: Uric Acid, Serum: 5.8 mg/dL (ref 2.5–7.1)

## 2023-12-05 LAB — PHOSPHORUS: Phosphorus: 3.1 mg/dL (ref 2.5–4.6)

## 2023-12-05 NOTE — Progress Notes (Signed)
 Progress Note   Patient: Rebecca Anthony FMW:[id number] DOB: [birth year] DOA: 12/03/2023     1 DOS: the patient was seen and examined on 12/05/2023   Brief hospital course: 78 y.o. female with medical history significant of A fib on Eliquis , HTN, HLD, pre-DM, hypothyroidism, obesity, scoliosis and spinal stenosis, chronic pain, IBS, diverticulitis, who presents with bilateral leg weakness and palpitation.     Patient states that she has a chronic back problem with difficulty in lower extremity, but normally she can get around using her cane. In the past 2 days, she feels weaker, particularly in both legs, no fall.  She could not get up from a chair without assistance which is not normal to her.  No leg numbness or leg pain.  No loss control of bladder or bowel movement.  No fall or injury. Pt states that she has palpitation over the past 24 hours.  Denies chest pain, SOB, cough, fever or chills.  No nausea, vomiting, diarrhea or abdominal pain.  Denies symptoms of UTI.   Patient was found to have A-fib with RVR, with heart rate 110-150 in ED.  8/19.  Patient states that her knees give out when she is trying to walk complains of weakness.  Also found to be in rapid atrial fibrillation.  Cardizem  drip stopped secondary to low blood pressure.  Started on low-dose oral Cardizem .   I assumed care on 12/05/23.  Patient's heart rates better controlled, overall in 60-80's Knee x-rays yesterday showed severe arthritis on left, moderate on right. Continuing to work with PT/OT. Patient to discuss with husband about SNF/rehab vs going home with home health.  Further hospital course and management as outlined below.    Assessment and Plan:  * Atrial fibrillation with RVR (HCC) Initially on Cardizem  drip, stopped secondary to low blood pressure.   Heart rate better controlled this afternoon than this morning.   --Need to assess ambulatory HR's --Telemetry monitoring --Continue metoprolol  50 mg  BID --Low dose Cardizem  PO --Continue Eliquis   Generalized weakness Patient states that her knees gave out when she walks.  Patient able to straight leg raise.  Left knee x-ray severe tricompartmental arthritis Right knee x-ray moderate medial arthritis --PT/OT - SNF recommended --Pt considering home health instead of rehab --Fall precautions   Leukocytosis - resolved --Monitor CBC   Hypothermia - resolved Urinalysis negative.  Chest x-ray negative. --Monitor clinically   Hypophosphatemia Replaced   Hypokalemia Replaced --Monitor BMP   Essential hypertension --Continue Toprol -XL and low-dose Cardizem    Hypothyroidism --On Synthroid    Myocardial injury Troponin trending down from 38 to 28.   Bilateral leg weakness MRI of the lumbar spine without cord compression to explain weakness, it does show multi-level foraminal stenosis and grade 1 anterolisthesis of L4 on L5.   Obesity (BMI 30.0-34.9) Class I obesity with a BMI of 32.7      Subjective: Pt seen seated edge of bed after working with PT today.  She would prefer to go home with [facility] instead of rehab but states she needs to discuss with her husband today. She feels her legs are a bit stronger than when she came in.  No other acute complaints.   Physical Exam: Vitals:   12/05/23 0335 12/05/23 0745 12/05/23 1021 12/05/23 1114  BP: 122/77 (!) 135/97 (!) 135/97 (!) 122/93  Pulse: 89  89 84  Resp: 18 18  20   Temp: 98 F (36.7 C) 98.4 F (36.9 C)  98.2 F (36.8 C)  TempSrc:  SpO2: 98% 98%  100%  Weight:      Height:       General exam: awake, alert, no acute distress HEENT: moist mucus membranes, hearing grossly normal  Respiratory system: CTA, no wheezes, rales or rhonchi, normal respiratory effort. Cardiovascular system: normal S1/S2, RRR, no pedal edema.   Gastrointestinal system: soft, NT, ND, no HSM felt, +bowel sounds. Central nervous system: A&O x 3. no gross focal neurologic deficits, normal  speech Extremities: moves all, no edema, normal tone Skin: dry, intact, normal temperature Psychiatry: normal mood, congruent affect, judgement and insight appear normal    Data Reviewed:  Notable labs --  Glucose 102 otherwise normal BMP Uric acid normal 5.8    Family Communication: None present. Pt able to update.  Disposition: Status is: Inpatient Remains inpatient appropriate because: needs SNF placement   Planned Discharge Destination: Skilled nursing facility vs [facility]    Time spent: 45 minutes  Author: Burnard DELENA Cunning, DO 12/05/2023 1:55 PM  For on call review [email or web address].

## 2023-12-05 NOTE — Plan of Care (Signed)
  Problem: Education: Goal: Knowledge of General Education information will improve Description: Including pain rating scale, medication(s)/side effects and non-pharmacologic comfort measures Outcome: Progressing   Problem: Health Behavior/Discharge Planning: Goal: Ability to manage health-related needs will improve Outcome: Progressing   Problem: Clinical Measurements: Goal: Ability to maintain clinical measurements within normal limits will improve Outcome: Progressing Goal: Cardiovascular complication will be avoided Outcome: Progressing   Problem: Activity: Goal: Risk for activity intolerance will decrease Outcome: Progressing   Problem: Coping: Goal: Level of anxiety will decrease Outcome: Progressing   Problem: Elimination: Goal: Will not experience complications related to bowel motility Outcome: Progressing

## 2023-12-05 NOTE — Evaluation (Signed)
 Physical Therapy Evaluation Patient Details Name: Rebecca Anthony MRN: [id number] DOB: 12-20-1945 Today's Date: 12/05/2023  History of Present Illness  Rebecca Anthony is a 78 y.o. female with medical history significant of A fib on Eliquis , HTN, HLD, pre-DM, hypothyroidism, obesity, scoliosis and spinal stenosis, chronic pain, IBS, diverticulitis, who presents with bilateral leg weakness and palpitation.   Clinical Impression  Pt admitted with above diagnosis. Pt currently with functional limitations due to the deficits listed below (see PT Problem List). Pt received upright in recliner agreeable to PT services. Reports PTA being mod-I with SPC for gait and indep with ADL's.   To date, pt fearful and hesitant with mobility. Relied on education and encouragement for x2 STS efforts needing mod multi modal cuing for hand placement and modA+1 for each STS. Heavy posterior lean appreciated on first rep needing PT demo for neutral standing during seated rest before second STS. Pt with improved carryover with STS on second attempt maintaining neutral positioning. Unable to take steps however to trial SPT back to bed. Pt reports significant fatigue requesting return to bed. Max HR 110 BPM with standing efforts. Utilized STEDY for t/f back to seated EOB. Encouraged mobility with NSG as able to progress functional mobility. Pt left seated EOB in care of attending MD. Pt will benefit from skilled PT services < 3 hours/day to address acute weakness and falls risk to maximize return to PLOF.      If plan is discharge home, recommend the following: Two people to help with walking and/or transfers;A lot of help with bathing/dressing/bathroom;Assistance with cooking/housework;Help with stairs or ramp for entrance   Can travel by private vehicle   No    Equipment Recommendations Other (comment) (TBD by next venue of care)  Recommendations for Other Services       Functional Status Assessment Patient has had a  recent decline in their functional status and demonstrates the ability to make significant improvements in function in a reasonable and predictable amount of time.     Precautions / Restrictions Precautions Precautions: Fall Restrictions Weight Bearing Restrictions Per Provider Order: No      Mobility  Bed Mobility               General bed mobility comments: NT. Left seated EOB. Patient Response: Cooperative  Transfers Overall transfer level: Needs assistance Equipment used: Rolling walker (2 wheels) Transfers: Sit to/from Stand, Bed to chair/wheelchair/BSC Sit to Stand: Mod assist           General transfer comment: heavy posterior lean in standing. Reliant on [place] for transfer Transfer via Financial trader: Stedy  Ambulation/Gait               General Gait Details: unable at this time  Acupuncturist Bed Tilt Bed Patient Response: Cooperative  Modified Rankin (Stroke Patients Only)       Balance Overall balance assessment: Needs assistance Sitting-balance support: Feet supported, No upper extremity supported Sitting balance-Leahy Scale: Good     Standing balance support: Reliant on assistive device for balance, Bilateral upper extremity supported Standing balance-Leahy Scale: Poor Standing balance comment: heavy BUE support needed to stand                             Pertinent Vitals/Pain Pain Assessment Pain Assessment: No/denies pain    Home Living Family/patient  expects to be discharged to:: Private residence Living Arrangements: Spouse/significant other Available Help at Discharge: Family;Available PRN/intermittently Type of Home: House Home Access: Stairs to enter Entrance Stairs-Rails: Right;Left Entrance Stairs-Number of Steps: 3-4 at garage. 5 from the front door   Home Layout: One level Home Equipment: Agricultural consultant (2 wheels);Cane - single point      Prior Function Prior  Level of Function : Independent/Modified Independent             Mobility Comments: Was amb with SPC prior to admission. Denies additional falls history in past 6 months. ADLs Comments: Pt reports she was independent for bathing and dressing PTA. Reports spouse has been assisting lately 2/2 increased weakness in BLE.     Extremity/Trunk Assessment   Upper Extremity Assessment Upper Extremity Assessment: Defer to OT evaluation    Lower Extremity Assessment Lower Extremity Assessment: Generalized weakness    Cervical / Trunk Assessment Cervical / Trunk Assessment: Normal  Communication   Communication Communication: Impaired Factors Affecting Communication: Hearing impaired    Cognition Arousal: Alert Behavior During Therapy: WFL for tasks assessed/performed   PT - Cognitive impairments: No apparent impairments                         Following commands: Intact       Cueing Cueing Techniques: Verbal cues, Tactile cues     General Comments General comments (skin integrity, edema, etc.): HR only up to 110 BPM with standing    Exercises Other Exercises Other Exercises: Role of PT in acute setting, d/c recs, DME needs.   Assessment/Plan    PT Assessment Patient needs continued PT services  PT Problem List Decreased strength;Decreased mobility;Decreased safety awareness;Decreased activity tolerance;Decreased balance       PT Treatment Interventions DME instruction;Therapeutic exercise;Gait training;Balance training;Stair training;Neuromuscular re-education;Functional mobility training;Patient/family education;Therapeutic activities    PT Goals (Current goals can be found in the Care Plan section)  Acute Rehab PT Goals Patient Stated Goal: improve LE strength PT Goal Formulation: With patient Time For Goal Achievement: 12/19/23 Potential to Achieve Goals: Fair    Frequency Min 3X/week     Co-evaluation               AM-PAC PT 6 Clicks  Mobility  Outcome Measure Help needed turning from your back to your side while in a flat bed without using bedrails?: A Little Help needed moving from lying on your back to sitting on the side of a flat bed without using bedrails?: A Little Help needed moving to and from a bed to a chair (including a wheelchair)?: Total Help needed standing up from a chair using your arms (e.g., wheelchair or bedside chair)?: A Lot Help needed to walk in hospital room?: Total Help needed climbing 3-5 steps with a railing? : Total 6 Click Score: 11    End of Session Equipment Utilized During Treatment: Gait belt Activity Tolerance: Patient limited by fatigue Patient left:  (seated EOB) Nurse Communication: Mobility status PT Visit Diagnosis: Muscle weakness (generalized) (M62.81);Difficulty in walking, not elsewhere classified (R26.2)    Time: 8884-8851 PT Time Calculation (min) (ACUTE ONLY): 33 min   Charges:   PT Evaluation $PT Eval Moderate Complexity: 1 Mod PT Treatments $Therapeutic Activity: 8-22 mins PT General Charges $$ ACUTE PT VISIT: 1 Visit        Dorina HERO. Fairly IV, PT, DPT Physical Therapist- [place]  [facility] 12/05/2023, 12:00 PM

## 2023-12-06 DIAGNOSIS — I4891 Unspecified atrial fibrillation: Secondary | ICD-10-CM | POA: Diagnosis not present

## 2023-12-06 NOTE — Care Management Important Message (Signed)
 Important Message  Patient Details  Name: Rebecca Anthony MRN: [id number] Date of Birth: February 16, 1946   Important Message Given:  Yes - Medicare IM     [name] 12/06/2023, 12:49 PM

## 2023-12-06 NOTE — Progress Notes (Signed)
 Progress Note   Patient: Rebecca Anthony FMW:[id number] DOB: 08/14/1945 DOA: 12/03/2023     2 DOS: the patient was seen and examined on 12/06/2023   Brief hospital course: 78 y.o. female with medical history significant of A fib on Eliquis , HTN, HLD, pre-DM, hypothyroidism, obesity, scoliosis and spinal stenosis, chronic pain, IBS, diverticulitis, who presents with bilateral leg weakness and palpitation.     Patient states that she has a chronic back problem with difficulty in lower extremity, but normally she can get around using her cane. In the past 2 days, she feels weaker, particularly in both legs, no fall.  She could not get up from a chair without assistance which is not normal to her.  No leg numbness or leg pain.  No loss control of bladder or bowel movement.  No fall or injury. Pt states that she has palpitation over the past 24 hours.  Denies chest pain, SOB, cough, fever or chills.  No nausea, vomiting, diarrhea or abdominal pain.  Denies symptoms of UTI.   Patient was found to have A-fib with RVR, with heart rate 110-150 in ED.  8/19.  Patient states that her knees give out when she is trying to walk complains of weakness.  Also found to be in rapid atrial fibrillation.  Cardizem  drip stopped secondary to low blood pressure.  Started on low-dose oral Cardizem .   I assumed care on 12/05/23.  Patient's heart rates better controlled, overall in 60-80's Knee x-rays yesterday showed severe arthritis on left, moderate on right. Continuing to work with PT/OT. Patient to discuss with husband about SNF/rehab vs going home with home health.  Further hospital course and management as outlined below.    Assessment and Plan:  * Atrial fibrillation with RVR (HCC) Initially on Cardizem  drip, stopped secondary to low blood pressure.   Heart rate better controlled this afternoon than this morning.   --Need to assess ambulatory HR's --Telemetry monitoring --Continue metoprolol  50 mg  BID --Low dose Cardizem  PO --Continue Eliquis   Bilateral leg weakness MRI of the lumbar spine without cord compression to explain weakness, it does show multi-level generative changes including foraminal stenosis and grade 1 anterolisthesis of L4 on L5. See MRI report for full details. --PT/OT - rehab recommended --Fall precautions  Generalized weakness Patient states that her knees gave out when she walks.  Patient able to straight leg raise.  Left knee x-ray severe tricompartmental arthritis. Right knee x-ray moderate medial arthritis. No unilateral or focal neurologic changes. --PT/OT - SNF recommended - pt agreeable --Fall precautions --TOC working on placement   Leukocytosis - resolved --Monitor CBC   Hypothermia - resolved Urinalysis negative, culture grew multiple species.   Chest x-ray negative. --Monitor clinically   Hypophosphatemia Replaced   Hypokalemia Replaced --Monitor BMP   Essential hypertension --Continue Toprol -XL and low-dose Cardizem    Hypothyroidism --On Synthroid    Myocardial injury - likely due to A-fib RVR. No chest pain. Troponin trending down from 38 to 28.   Obesity (BMI 30.0-34.9) Class I obesity with a BMI of 32.7      Subjective: Pt up in recliner when seen today. She reports doing better with PT today, was able to tolerate standing for a couple of minutes which is significantly improved.  Pt states feeling more encouraged with progress today than previously.  Pt has decided to go to rehab as recommended by PT/OT.  No other acute complaints.   Physical Exam: Vitals:   12/05/23 2331 12/06/23 9541 12/06/23 9156 12/06/23 9040  BP: 114/62 (!) 122/92 136/89   Pulse: 74 61 72   Resp: 17 17 20    Temp: 98.7 F (37.1 C) 98.5 F (36.9 C)  98.3 F (36.8 C)  TempSrc: Oral   Oral  SpO2: 98% 98% 98%   Weight:      Height:       General exam: awake, alert, no acute distress HEENT: moist mucus membranes, hearing grossly normal   Respiratory system: CTA, no wheezes, rales or rhonchi, normal respiratory effort. Cardiovascular system: normal S1/S2, RRR, no pedal edema.   Gastrointestinal system: soft, NT, ND, no HSM felt, +bowel sounds. Central nervous system: A&O x 3. no gross focal neurologic deficits, normal speech Extremities: moves all, no edema, normal tone Skin: dry, intact, normal temperature Psychiatry: normal mood, congruent affect, judgement and insight appear normal    Data Reviewed:  Notable labs --  Glucose 102 otherwise normal BMP Uric acid normal 5.8    Family Communication: Updated daughter Joselyn by phone this afternoon.   Disposition: Status is: Inpatient Remains inpatient appropriate because: needs SNF placement for rehab.    Planned Discharge Destination: Skilled nursing facility vs [facility]    Time spent: 45 minutes  Author: Burnard DELENA Cunning, DO 12/06/2023 12:32 PM  For on call review [email or web address].

## 2023-12-06 NOTE — Plan of Care (Signed)

## 2023-12-06 NOTE — Progress Notes (Signed)
 Physical Therapy Treatment Patient Details Name: Rebecca Anthony MRN: [id number] DOB: October 16, 1945 Today's Date: 12/06/2023   History of Present Illness Rebecca Anthony is a 78 y.o. female with medical history significant of A fib on Eliquis , HTN, HLD, pre-DM, hypothyroidism, obesity, scoliosis and spinal stenosis, chronic pain, IBS, diverticulitis, who presents with bilateral leg weakness and palpitation. MD assessment includes: A-fib with RVR, generalized weakness, myocardial injury, hypokalemia, and hypophosphatemia.    PT Comments  Pt was pleasant and motivated to participate during the session and put forth good effort throughout. Pt was able to go from sup to sit at the EOB with significant time and effort and use of the bed rails but required no physical assist.  Pt was able to perform multiple sit to/from stands from the EOB using a BRW initially and then a sara stedy that was ultimately used to get from bed to chair for pt safety.  While standing initially with the BRW pt was able to march in place on each LE and then take several very small lateral steps but then impulsively returned to sitting at the EOB due to LE weakness.  Pt making progress towards goals but remains functionally weak and is at a high risk for falls.  Pt will benefit from continued PT services upon discharge to safely address deficits listed in patient problem list for decreased caregiver assistance and eventual return to PLOF.      If plan is discharge home, recommend the following: Two people to help with walking and/or transfers;A lot of help with bathing/dressing/bathroom;Assistance with cooking/housework;Help with stairs or ramp for entrance;Assist for transportation   Can travel by private vehicle     No  Equipment Recommendations  Other (comment) (TBD)    Recommendations for Other Services       Precautions / Restrictions Precautions Precautions: Fall Recall of Precautions/Restrictions:  Intact Restrictions Weight Bearing Restrictions Per Provider Order: No     Mobility  Bed Mobility Overal bed mobility: Needs Assistance Bed Mobility: Supine to Sit     Supine to sit: Supervision     General bed mobility comments: Min verbal cues for sequencing but no physical assist needed    Transfers Overall transfer level: Needs assistance Equipment used: Rolling walker (2 wheels) Transfers: Sit to/from Stand Sit to Stand: Contact guard assist, From elevated surface           General transfer comment: Mod verbal and tactile cues for sequencing but pt able to come to standing with extra effort only, no physical assist needed both with use of RW and sara stedy Transfer via Lift Equipment: Stedy  Ambulation/Gait Ambulation/Gait assistance: Min assist +2 for pt safety  Gait Distance (Feet): 2 Feet Assistive device: Rolling walker (2 wheels) Gait Pattern/deviations: Step-to pattern, Trunk flexed, Shuffle, Decreased step length - right, Decreased step length - left Gait velocity: decreased     General Gait Details: Pt able to take several very small, effortful steps laterally at the EOB before needing to return to sitting somewhat impulsively due to weakness; min A needed to manage/advance the [employer]             Wheelchair Mobility     Tilt Bed    Modified Rankin (Stroke Patients Only)       Balance Overall balance assessment: Needs assistance Sitting-balance support: Feet supported, No upper extremity supported Sitting balance-Leahy Scale: Good     Standing balance support: Reliant on assistive device for balance, Bilateral upper extremity  supported, During functional activity Standing balance-Leahy Scale: Fair                              Hotel manager: Impaired Factors Affecting Communication: Hearing impaired  Cognition Arousal: Alert Behavior During Therapy: WFL for tasks assessed/performed   PT  - Cognitive impairments: No apparent impairments                         Following commands: Intact      Cueing Cueing Techniques: Verbal cues, Tactile cues, Visual cues  Exercises Total Joint Exercises Long Arc Quad: AROM, Strengthening, Both, 10 reps Knee Flexion: AROM, Strengthening, Both, 10 reps Marching in Standing: Strengthening, Both, 5 reps, Standing    General Comments        Pertinent Vitals/Pain Pain Assessment Pain Assessment: No/denies pain    Home Living                          Prior Function            PT Goals (current goals can now be found in the care plan section) Progress towards PT goals: Progressing toward goals    Frequency    Min 3X/week      PT Plan      Co-evaluation PT/OT/SLP Co-Evaluation/Treatment: Yes Reason for Co-Treatment: For patient/therapist safety PT goals addressed during session: Mobility/safety with mobility;Proper use of DME;Strengthening/ROM        AM-PAC PT 6 Clicks Mobility   Outcome Measure  Help needed turning from your back to your side while in a flat bed without using bedrails?: A Little Help needed moving from lying on your back to sitting on the side of a flat bed without using bedrails?: A Little Help needed moving to and from a bed to a chair (including a wheelchair)?: A Little Help needed standing up from a chair using your arms (e.g., wheelchair or bedside chair)?: A Little Help needed to walk in hospital room?: Total Help needed climbing 3-5 steps with a railing? : Total 6 Click Score: 14    End of Session Equipment Utilized During Treatment: Gait belt Activity Tolerance: Patient tolerated treatment well Patient left: Other (comment) (Pt left with OT in sara stedy) Nurse Communication: Mobility status PT Visit Diagnosis: Muscle weakness (generalized) (M62.81);Difficulty in walking, not elsewhere classified (R26.2)     Time: 8991-8956 PT Time Calculation (min) (ACUTE  ONLY): 35 min  Charges:    $Therapeutic Exercise: 8-22 mins $Therapeutic Activity: 8-22 mins PT General Charges $$ ACUTE PT VISIT: 1 Visit                    D. Scott Jonluke Cobbins PT, DPT 12/06/23, 11:58 AM

## 2023-12-06 NOTE — TOC Initial Note (Signed)
 Transition of Care [facility]) - Initial/Assessment Note    Patient Details  Name: Rebecca Anthony MRN: [id number] Date of Birth: 03-Sep-1945  Transition of Care [facility]) CM/SW Contact:    Lauraine JAYSON Carpen, LCSW Phone Number: 12/06/2023, 12:06 PM  Clinical Narrative:  CSW met with patient. No family at bedside. CSW introduced role and explained that therapy recommendations would be discussed. Patient is agreeable to SNF placement. Will follow up with bed offers once available. No further concerns. CSW will continue to follow patient for support and facilitate discharge to SNF once medically stable.                Expected Discharge Plan: Skilled Nursing Facility Barriers to Discharge: Continued Medical Work up   Patient Goals and CMS Choice            Expected Discharge Plan and Services     Post Acute Care Choice: Skilled Nursing Facility Living arrangements for the past 2 months: Single Family Home                                      Prior Living Arrangements/Services Living arrangements for the past 2 months: Single Family Home Lives with:: Spouse Patient language and need for interpreter reviewed:: Yes Do you feel safe going back to the place where you live?: Yes      Need for Family Participation in Patient Care: Yes (Comment) Care giver support system in place?: Yes (comment)   Criminal Activity/Legal Involvement Pertinent to Current Situation/Hospitalization: No - Comment as needed  Activities of Daily Living   ADL Screening (condition at time of admission) Independently performs ADLs?: No Does the patient have a NEW difficulty with bathing/dressing/toileting/self-feeding that is expected to last >3 days?: Yes (Initiates electronic notice to provider for possible OT consult) Does the patient have a NEW difficulty with getting in/out of bed, walking, or climbing stairs that is expected to last >3 days?: Yes (Initiates electronic notice to provider for possible PT  consult) Does the patient have a NEW difficulty with communication that is expected to last >3 days?: No Is the patient deaf or have difficulty hearing?: Yes (difficulty hearing) Does the patient have difficulty seeing, even when wearing glasses/contacts?: No Does the patient have difficulty concentrating, remembering, or making decisions?: No  Permission Sought/Granted Permission sought to share information with : Facility Industrial/product designer granted to share information with : Yes, Verbal Permission Granted     Permission granted to share info w AGENCY: SNF's        Emotional Assessment Appearance:: Appears stated age Attitude/Demeanor/Rapport: Engaged, Gracious Affect (typically observed): Accepting, Appropriate, Calm, Pleasant Orientation: : Oriented to Self, Oriented to Place, Oriented to  Time, Oriented to Situation Alcohol / Substance Use: Not Applicable Psych Involvement: No (comment)  Admission diagnosis:  Atrial fibrillation with RVR (HCC) [I48.91] Weakness of both lower extremities [R29.898] Patient Active Problem List   Diagnosis Date Noted   Hypophosphatemia 12/04/2023   Generalized weakness 12/04/2023   Atrial fibrillation with RVR (HCC) 12/03/2023   Hypothermia 12/03/2023   Leukocytosis 12/03/2023   Myocardial injury 12/03/2023   Hypokalemia 12/03/2023   Bilateral leg weakness 12/03/2023   Arthralgia of left knee 11/21/2022   Lumbar spondylosis 02/09/2022   Localized osteoporosis without current pathological fracture 11/03/2021   Degeneration of lumbar intervertebral disc 10/06/2021   Degenerative scoliosis 10/06/2021   Spondylosis without myelopathy 10/06/2021   Chronic  atrial fibrillation (HCC) 12/04/2019   Aortic atherosclerosis (HCC) 12/04/2019   Chronic sciatica of left side 10/24/2019   Lumbar radiculopathy 08/28/2019   Dyslipidemia 05/30/2018   Prediabetes 05/14/2018   Thyroid  nodule 01/28/2018   Gallbladder polyp 01/28/2018   Cyst  of pancreas 01/28/2018   Foraminal stenosis of thoracic region 01/28/2018   Idiopathic scoliosis of thoracolumbar spine 11/28/2017   Chronic pain syndrome 10/31/2017   Obesity (BMI 30.0-34.9) 09/13/2017   Hematuria, microscopic 07/16/2017   Hypothyroidism 02/23/2017   Osteopenia of left femoral neck 03/23/2016   Essential hypertension    PCP:  Gareth Mliss FALCON, FNP Pharmacy:   [facility] [phone] [address] [place] [address] [place] [address] Phone: [phone] Fax: [phone]     Social Drivers of Health (SDOH) Social History: SDOH Screenings   Food Insecurity: No Food Insecurity (12/04/2023)  Housing: Low Risk  (12/04/2023)  Transportation Needs: No Transportation Needs (12/04/2023)  Utilities: Not At Risk (12/04/2023)  Alcohol Screen: Low Risk  (12/15/2022)  Depression (PHQ2-9): Low Risk  (09/06/2023)  Financial Resource Strain: Low Risk  (09/07/2022)  Physical Activity: Insufficiently Active (09/07/2022)  Social Connections: Moderately Isolated (12/04/2023)  Stress: No Stress Concern Present (09/07/2022)  Tobacco Use: Low Risk  (12/04/2023)   SDOH Interventions:     Readmission Risk Interventions     No data to display

## 2023-12-06 NOTE — Progress Notes (Signed)
 Occupational Therapy Treatment Patient Details Name: Rebecca Anthony MRN: [id number] DOB: October 18, 1945 Today's Date: 12/06/2023   History of present illness Rebecca Anthony is a 78 y.o. female with medical history significant of A fib on Eliquis , HTN, HLD, pre-DM, hypothyroidism, obesity, scoliosis and spinal stenosis, chronic pain, IBS, diverticulitis, who presents with bilateral leg weakness and palpitation.   OT comments  Ms Heiser was seen for OT treatment on this date overlapping with PT for safe mobility. Upon arrival to room pt seated EOB, agreeable to tx. Pt requires MIN A + Camie Ip for [facility] t/f, assist to stand from [facility] and chair height. CGA sit<>stand from elevated bed with Stedy. MAX A pericare in standing. MIN A don/doff gown in sitting. Pt making good progress toward goals, will continue to follow POC. Discharge recommendation remains appropriate.        If plan is discharge home, recommend the following:  Two people to help with walking and/or transfers;A lot of help with bathing/dressing/bathroom;Assist for transportation;Help with stairs or ramp for entrance;Assistance with cooking/housework;Supervision due to cognitive status   Equipment Recommendations  BSC/3in1    Recommendations for Other Services      Precautions / Restrictions Precautions Precautions: Fall Recall of Precautions/Restrictions: Intact Restrictions Weight Bearing Restrictions Per Provider Order: No       Mobility Bed Mobility               General bed mobility comments: not tested    Transfers Overall transfer level: Needs assistance Equipment used: Rolling walker (2 wheels) Transfers: Sit to/from Stand Sit to Stand: Contact guard assist                 Balance Overall balance assessment: Needs assistance Sitting-balance support: Feet supported, No upper extremity supported Sitting balance-Leahy Scale: Good     Standing balance support: Reliant on assistive device for balance,  Bilateral upper extremity supported Standing balance-Leahy Scale: Fair                             ADL either performed or assessed with clinical judgement   ADL Overall ADL's : Needs assistance/impaired                                       General ADL Comments: MAX A pericare in standing. MIN A don/doff gown in sitting    Extremity/Trunk Assessment              Vision       Perception     Praxis     Communication Communication Communication: Impaired Factors Affecting Communication: Hearing impaired   Cognition Arousal: Alert Behavior During Therapy: WFL for tasks assessed/performed Cognition: No family/caregiver present to determine baseline             OT - Cognition Comments: increased processing time                 Following commands: Intact        Cueing   Cueing Techniques: Verbal cues, Tactile cues  Exercises      Shoulder Instructions       General Comments      Pertinent Vitals/ Pain       Pain Assessment Pain Assessment: No/denies pain  Home Living  Prior Functioning/Environment              Frequency  Min 2X/week        Progress Toward Goals  OT Goals(current goals can now be found in the care plan section)  Progress towards OT goals: Progressing toward goals  Acute Rehab OT Goals OT Goal Formulation: With patient Time For Goal Achievement: 12/18/23 Potential to Achieve Goals: Good ADL Goals Pt Will Perform Grooming: sitting;with modified independence Pt Will Perform Lower Body Dressing: with adaptive equipment;sit to/from stand;with min assist Pt Will Transfer to Toilet: bedside commode;stand pivot transfer;with min assist Pt Will Perform Toileting - Clothing Manipulation and hygiene: sitting/lateral leans;sit to/from stand;with min assist;with adaptive equipment  Plan      Co-evaluation                  AM-PAC OT 6 Clicks Daily Activity     Outcome Measure   Help from another person eating meals?: None Help from another person taking care of personal grooming?: A Little Help from another person toileting, which includes using toliet, bedpan, or urinal?: A Lot Help from another person bathing (including washing, rinsing, drying)?: A Lot Help from another person to put on and taking off regular upper body clothing?: A Little Help from another person to put on and taking off regular lower body clothing?: A Lot 6 Click Score: 16    End of Session Equipment Utilized During Treatment: Gait belt;Rolling walker (2 wheels)  OT Visit Diagnosis: Other abnormalities of gait and mobility (R26.89);Muscle weakness (generalized) (M62.81)   Activity Tolerance Patient tolerated treatment well   Patient Left in chair;with call bell/phone within reach;with chair alarm set   Nurse Communication Mobility status        Time: 8975-8881 OT Time Calculation (min): 54 min  Charges: OT General Charges $OT Visit: 1 Visit OT Treatments $Self Care/Home Management : 23-37 mins  Elston Slot, M.S. OTR/L  12/06/23, 11:43 AM  ascom [phone]

## 2023-12-06 NOTE — [place] FL2 (Signed)
 [place]  MEDICAID FL2 LEVEL OF CARE FORM     IDENTIFICATION  Patient Name: Rebecca Anthony Birthdate: May 30, 1945 Sex: female Admission Date (Current Location): 12/03/2023  [place] and [place] Number:  Chiropodist and Address:  [facility], [address] [place], [place] [address]      Provider Number: [id number]  Attending Physician Name and Address:  Fausto Burnard LABOR, DO  Relative Name and Phone Number:       Current Level of Care: Hospital Recommended Level of Care: Skilled Nursing Facility Prior Approval Number:    Date Approved/Denied:   PASRR Number: [id number]  Discharge Plan: SNF    Current Diagnoses: Patient Active Problem List   Diagnosis Date Noted   Hypophosphatemia 12/04/2023   Generalized weakness 12/04/2023   Atrial fibrillation with RVR (HCC) 12/03/2023   Hypothermia 12/03/2023   Leukocytosis 12/03/2023   Myocardial injury 12/03/2023   Hypokalemia 12/03/2023   Bilateral leg weakness 12/03/2023   Arthralgia of left knee 11/21/2022   Lumbar spondylosis 02/09/2022   Localized osteoporosis without current pathological fracture 11/03/2021   Degeneration of lumbar intervertebral disc 10/06/2021   Degenerative scoliosis 10/06/2021   Spondylosis without myelopathy 10/06/2021   Chronic atrial fibrillation (HCC) 12/04/2019   Aortic atherosclerosis (HCC) 12/04/2019   Chronic sciatica of left side 10/24/2019   Lumbar radiculopathy 08/28/2019   Dyslipidemia 05/30/2018   Prediabetes 05/14/2018   Thyroid  nodule 01/28/2018   Gallbladder polyp 01/28/2018   Cyst of pancreas 01/28/2018   Foraminal stenosis of thoracic region 01/28/2018   Idiopathic scoliosis of thoracolumbar spine 11/28/2017   Chronic pain syndrome 10/31/2017   Obesity (BMI 30.0-34.9) 09/13/2017   Hematuria, microscopic 07/16/2017   Hypothyroidism 02/23/2017   Osteopenia of left femoral neck 03/23/2016   Essential hypertension     Orientation  RESPIRATION BLADDER Height & Weight     Self, Time, Situation, Place  Normal Continent, External catheter Weight: 222 lb 0.1 oz (100.7 kg) Height:  5' 9 (175.3 cm)  BEHAVIORAL SYMPTOMS/MOOD NEUROLOGICAL BOWEL NUTRITION STATUS   (None)   Continent Diet (Heart healthy)  AMBULATORY STATUS COMMUNICATION OF NEEDS Skin   Limited Assist Verbally Skin abrasions, Bruising, Other (Comment) (Erythema/redness.)                       Personal Care Assistance Level of Assistance  Bathing, Feeding, Dressing Bathing Assistance: Maximum assistance Feeding assistance: Limited assistance Dressing Assistance: Maximum assistance     Functional Limitations Info  Sight, Hearing, Speech Sight Info: Adequate Hearing Info: Adequate Speech Info: Adequate    SPECIAL CARE FACTORS FREQUENCY  PT (By licensed PT), OT (By licensed OT)     PT Frequency: 5 x week OT Frequency: 5 x week            Contractures Contractures Info: Not present    Additional Factors Info  Code Status, Allergies Code Status Info: Full code Allergies Info: Anaprox (Naproxen Sodium)           Current Medications (12/06/2023):  This is the current hospital active medication list Current Facility-Administered Medications  Medication Dose Route Frequency Provider Last Rate Last Admin   acetaminophen  (TYLENOL ) tablet 650 mg  650 mg Oral Q6H PRN Niu, Xilin, MD   650 mg at 12/05/23 2305   apixaban  (ELIQUIS ) tablet 5 mg  5 mg Oral BID Niu, Xilin, MD   5 mg at 12/06/23 0957   ascorbic acid  (VITAMIN C ) tablet 500 mg  500 mg Oral Daily  Niu, Xilin, MD   500 mg at 12/06/23 9042   diltiazem  (CARDIZEM ) tablet 30 mg  30 mg Oral TID Josette Ade, MD   30 mg at 12/06/23 0957   gabapentin  (NEURONTIN ) capsule 300 mg  300 mg Oral QHS Niu, Xilin, MD   300 mg at 12/05/23 2140   hydrALAZINE  (APRESOLINE ) injection 5 mg  5 mg Intravenous Q2H PRN Niu, Xilin, MD       levothyroxine  (SYNTHROID ) tablet 50 mcg  50 mcg Oral Q0600 Niu, Xilin,  MD   50 mcg at 12/06/23 0605   methocarbamol  (ROBAXIN ) tablet 750 mg  750 mg Oral Q8H PRN Niu, Xilin, MD       metoprolol  succinate (TOPROL -XL) 24 hr tablet 50 mg  50 mg Oral BID Niu, Xilin, MD   50 mg at 12/06/23 0957   metoprolol  tartrate (LOPRESSOR ) injection 2.5 mg  2.5 mg Intravenous Q2H PRN Niu, Xilin, MD       nystatin  (MYCOSTATIN /NYSTOP ) topical powder 1 Application  1 Application Topical TID Niu, Xilin, MD   1 Application at 12/05/23 1024   ondansetron  (ZOFRAN ) injection 4 mg  4 mg Intravenous Q8H PRN Niu, Xilin, MD       polyethylene glycol (MIRALAX  / GLYCOLAX ) packet 17 g  17 g Oral Daily PRN Niu, Xilin, MD   17 g at 12/05/23 1024     Discharge Medications: Please see discharge summary for a list of discharge medications.  Relevant Imaging Results:  Relevant Lab Results:   Additional Information SS#: [id number]  Lauraine JAYSON Carpen, LCSW

## 2023-12-07 DIAGNOSIS — I4891 Unspecified atrial fibrillation: Secondary | ICD-10-CM | POA: Diagnosis not present

## 2023-12-07 MED ORDER — MAGNESIUM CHLORIDE 64 MG PO TBEC
1.0000 | DELAYED_RELEASE_TABLET | Freq: Every day | ORAL | Status: DC
  Administered 2023-12-07 – 2023-12-09 (×3): 64 mg via ORAL
  Filled 2023-12-07 (×3): qty 1

## 2023-12-07 NOTE — Progress Notes (Signed)
 Progress Note   Patient: Rebecca Anthony FMW:[id number] DOB: 1945-10-12 DOA: 12/03/2023     3 DOS: the patient was seen and examined on 12/07/2023   Brief hospital course: 78 y.o. female with medical history significant of A fib on Eliquis , HTN, HLD, pre-DM, hypothyroidism, obesity, scoliosis and spinal stenosis, chronic pain, IBS, diverticulitis, who presents with bilateral leg weakness and palpitation.     Patient states that she has a chronic back problem with difficulty in lower extremity, but normally she can get around using her cane. In the past 2 days, she feels weaker, particularly in both legs, no fall.  She could not get up from a chair without assistance which is not normal to her.  No leg numbness or leg pain.  No loss control of bladder or bowel movement.  No fall or injury. Pt states that she has palpitation over the past 24 hours.  Denies chest pain, SOB, cough, fever or chills.  No nausea, vomiting, diarrhea or abdominal pain.  Denies symptoms of UTI.   Patient was found to have A-fib with RVR, with heart rate 110-150 in ED.  8/19.  Patient states that her knees give out when she is trying to walk complains of weakness.  Also found to be in rapid atrial fibrillation.  Cardizem  drip stopped secondary to low blood pressure.  Started on low-dose oral Cardizem .   I assumed care on 12/05/23.  Patient's heart rates better controlled, overall in 60-80's Knee x-rays yesterday showed severe arthritis on left, moderate on right. Continuing to work with PT/OT. Patient to discuss with husband about SNF/rehab vs going home with home health.  Further hospital course and management as outlined below.    Assessment and Plan:  * Atrial fibrillation with RVR (HCC) Initially on Cardizem  drip, stopped secondary to low blood pressure.   Heart rate better controlled this afternoon than this morning.   --Need to assess ambulatory HR's --Telemetry monitoring --Continue metoprolol  50 mg  BID --Low dose Cardizem  PO --Continue Eliquis   Bilateral leg weakness MRI of the lumbar spine without cord compression to explain weakness, it does show multi-level generative changes including foraminal stenosis and grade 1 anterolisthesis of L4 on L5. See MRI report for full details. --PT/OT - rehab recommended --Fall precautions  Generalized weakness Patient states that her knees gave out when she walks.  Patient able to straight leg raise.  Left knee x-ray severe tricompartmental arthritis. Right knee x-ray moderate medial arthritis. No unilateral or focal neurologic changes. --PT/OT - SNF recommended - pt agreeable --Fall precautions --TOC working on placement   Leukocytosis - resolved --Monitor CBC   Hypothermia - resolved Urinalysis negative, culture grew multiple species.   Chest x-ray negative. --Monitor clinically   Hypophosphatemia Replaced   Hypokalemia Replaced --Monitor BMP   Essential hypertension --Continue Toprol -XL and low-dose Cardizem    Hypothyroidism --On Synthroid    Myocardial injury - likely due to A-fib RVR. No chest pain. Troponin trending down from 38 to 28.   Obesity (BMI 30.0-34.9) Class I obesity with a BMI of 32.7      Subjective: Pt awake sitting up in bed this AM.  She reports not sleeping well and tired today.  She ate more fruit than usual yesterday and was up using bathroom overnight. Reports tingling in her feet, wonders about RLS. Feet feel better with pressure, when she rests them on the footboard of the bed.  She was moved to a new room overnight due to a lot of noise near her  old room.   Physical Exam: Vitals:   12/06/23 2356 12/07/23 0413 12/07/23 0720 12/07/23 1205  BP: (!) 123/96 113/67 (!) 142/85 126/75  Pulse: (!) 56 69 96 (!) 47  Resp: 18 18 18    Temp: 97.8 F (36.6 C) (!) 97.4 F (36.3 C) 98.7 F (37.1 C) 97.8 F (36.6 C)  TempSrc: Oral Oral    SpO2: 98% 100% 99% 100%  Weight:      Height:       General  exam: awake, alert, no acute distress HEENT: moist mucus membranes, hearing grossly normal  Respiratory system: CTA, no wheezes, rales or rhonchi, normal respiratory effort. Cardiovascular system: normal S1/S2, RRR, trace ankle edema.   Gastrointestinal system: soft, NT, ND, no HSM felt, +bowel sounds. Central nervous system: A&O x 3. no gross focal neurologic deficits, normal speech Skin: dry, intact, normal temperature Psychiatry: normal mood, congruent affect, judgement and insight appear normal    Data Reviewed: No new labs today Notable labs from 8/20 --  Glucose 102 otherwise normal BMP Uric acid normal 5.8    Family Communication: Updated daughter Joselyn by phone 8/21 afternoon. No new medical updates at this time.   Disposition: Status is: Inpatient Remains inpatient appropriate because: needs SNF placement for rehab.    Planned Discharge Destination: Skilled nursing facility vs [facility]    Time spent: 35 minutes  Author: Burnard DELENA Cunning, DO 12/07/2023 1:35 PM  For on call review [email or web address].

## 2023-12-07 NOTE — Discharge Summary (Incomplete)
 Physician Discharge Summary   Patient: Rebecca Anthony MRN: [id number] DOB: 08-20-1945  Admit date:     12/03/2023  Discharge date: {dischdate:26783}  Discharge Physician: Burnard DELENA Cunning   PCP: Gareth Mliss FALCON, FNP   Recommendations at discharge:  {Tip this will not be part of the note when signed- Example include specific recommendations for outpatient follow-up, pending tests to follow-up on. (Optional):26781}  ***  Discharge Diagnoses: Principal Problem:   Atrial fibrillation with RVR (HCC) Active Problems:   Generalized weakness   Hypothermia   Leukocytosis   Hypokalemia   Hypophosphatemia   Essential hypertension   Hypothyroidism   Myocardial injury   Bilateral leg weakness   Obesity (BMI 30.0-34.9)  Resolved Problems:   * No resolved hospital problems. *  Hospital Course: 78 y.o. female with medical history significant of A fib on Eliquis , HTN, HLD, pre-DM, hypothyroidism, obesity, scoliosis and spinal stenosis, chronic pain, IBS, diverticulitis, who presents with bilateral leg weakness and palpitation.     Patient states that she has a chronic back problem with difficulty in lower extremity, but normally she can get around using her cane. In the past 2 days, she feels weaker, particularly in both legs, no fall.  She could not get up from a chair without assistance which is not normal to her.  No leg numbness or leg pain.  No loss control of bladder or bowel movement.  No fall or injury. Pt states that she has palpitation over the past 24 hours.  Denies chest pain, SOB, cough, fever or chills.  No nausea, vomiting, diarrhea or abdominal pain.  Denies symptoms of UTI.   Patient was found to have A-fib with RVR, with heart rate 110-150 in ED.  8/19.  Patient states that her knees give out when she is trying to walk complains of weakness.  Also found to be in rapid atrial fibrillation.  Cardizem  drip stopped secondary to low blood pressure.  Started on low-dose oral  Cardizem .  Assessment and Plan: * Atrial fibrillation with RVR (HCC) Continue metoprolol  XL 50 mg twice a day.  Cardizem  drip stopped secondary to low blood pressure.  Low-dose oral Cardizem  3 times daily.  Continue Eliquis  for anticoagulation.  Heart rate better controlled this afternoon than this morning.  Need to see what happens when she moves around.  Generalized weakness Patient states that her knees gave out when she walks.  Patient able to straight leg raise.  Will start off with a knee x-ray.  Check uric acid.  PT and OT evaluations.  With OT, it took 2 people to get her up.  Leukocytosis Normalized  Hypothermia Last temperature normal.  Urinalysis negative.  Chest x-ray negative.  Hypophosphatemia Replaced  Hypokalemia Replaced  Essential hypertension Continue Toprol -XL and low-dose Cardizem   Hypothyroidism On Synthroid   Myocardial injury Troponin trending down from 38 to 28.  Bilateral leg weakness MRI of the lumbar spine does not explain this.  Obesity (BMI 30.0-34.9) Class I obesity with a BMI of 32.7      {Tip this will not be part of the note when signed Body mass index is 32.78 kg/m. , ,  (Optional):26781}  {(NOTE) Pain control PDMP Statment (Optional):26782} Consultants: *** Procedures performed: ***  Disposition: {Plan; Disposition:26390} Diet recommendation:  {Diet_Plan:26776} DISCHARGE MEDICATION: Allergies as of 12/07/2023       Reactions   Anaprox [naproxen Sodium] Rash     Med Rec must be completed prior to using this SMARTLINK***  Discharge Exam: Filed Weights   12/04/23 1619  Weight: 100.7 kg   ***  Condition at discharge: {DC Condition:26389}  The results of significant diagnostics from this hospitalization (including imaging, microbiology, ancillary and laboratory) are listed below for reference.   Imaging Studies: DG Knee 1-2 Views Right Result Date: 12/04/2023 CLINICAL DATA:  Chronic knee pain EXAM: RIGHT  KNEE - 1-2 VIEW COMPARISON:  None Available. FINDINGS: No fracture or malalignment. Moderate lateral tibiofemoral joint space narrowing and degenerative change. No significant knee effusion IMPRESSION: Moderate lateral tibiofemoral degenerative change. Electronically Signed   By: Luke Bun M.D.   On: 12/04/2023 18:12   DG Knee 1-2 Views Left Result Date: 12/04/2023 CLINICAL DATA:  Chronic knee pain EXAM: LEFT KNEE - 1-2 VIEW COMPARISON:  None Available. FINDINGS: No fracture or malalignment. Tricompartment arthritis with mild to moderate lateral tibiofemoral disease. No significant effusion IMPRESSION: Tricompartment arthritis, no acute osseous abnormality Electronically Signed   By: Luke Bun M.D.   On: 12/04/2023 18:11   MR Lumbar Spine W Wo Contrast Result Date: 12/03/2023 CLINICAL DATA:  24 hours of lower extremity weakness, no back pain EXAM: MRI LUMBAR SPINE WITHOUT AND WITH CONTRAST TECHNIQUE: Multiplanar and multiecho pulse sequences of the lumbar spine were obtained without and with intravenous contrast. CONTRAST:  10mL GADAVIST  GADOBUTROL  1 MMOL/ML IV SOLN COMPARISON:  Lumbar radiographs June 30, 2019. FINDINGS: Segmentation: Standard. Alignment: Rotatory levocurvature. Grade 1 anterolisthesis of L4 on L5. Vertebrae: No fracture, evidence of discitis, or suspicious bone lesion. Conus medullaris and cauda equina: Conus extends to the L1-L2 level. Conus and cauda equina appear normal. Paraspinal and other soft tissues: Unremarkable. Disc levels: T12-L1: Mild disc bulging. Mild facet arthropathy. No significant stenosis. L1-L2: Mild disc bulging, ligamentum flavum thickening and facet arthropathy. Mild right foraminal stenosis. Patent canal and left foramen. L2-L3: Broad disc bulging with ligamentum flavum thickening and facet arthropathy. Resulting mild canal stenosis and mild right foraminal stenosis. L3-L4: Disc bulging with left eye facet arthropathy. Resulting moderate left and mild right  foraminal stenosis. Mild left subarticular recess stenosis. L4-L5: Grade 1 anterolisthesis. Left greater than right facet arthropathy. Resulting mild left subarticular recess stenosis and mild left foraminal stenosis. L5-S1: Disc bulging and left eccentric endplate spurring. Left greater than right facet arthropathy. Resulting mild left foraminal stenosis. Patent canal. IMPRESSION: 1. At L3-L4, moderate left and mild right foraminal stenosis. Mild left subarticular recess stenosis. 2. At L4-L5, mild left subarticular recess stenosis and mild left foraminal stenosis. 3. At L5-S1, mild left foraminal stenosis. 4. At L2-L3, mild canal stenosis and mild right foraminal stenosis. 5. Rotatory levocurvature.  Grade 1 anterolisthesis of L4 on L5. Electronically Signed   By: Gilmore GORMAN Molt M.D.   On: 12/03/2023 21:59   DG Chest 2 View Result Date: 12/03/2023 CLINICAL DATA:  Atrial fibrillation with rapid ventricular response, weakness EXAM: CHEST - 2 VIEW COMPARISON:  None Available. FINDINGS: Frontal and lateral views of the chest demonstrate an enlarged cardiac silhouette. No airspace disease, effusion, or pneumothorax. No acute bony abnormalities. IMPRESSION: 1. Enlarged cardiac silhouette.  No acute airspace disease. Electronically Signed   By: Ozell Daring M.D.   On: 12/03/2023 16:47    Microbiology: Results for orders placed or performed during the hospital encounter of 12/03/23  Urine Culture (for pregnant, neutropenic or urologic patients or patients with an indwelling urinary catheter)     Status: Abnormal   Collection Time: 12/03/23  4:09 PM   Specimen: Urine, Random  Result Value Ref Range  Status   Specimen Description   Final    URINE, RANDOM Performed at [facility], [address] [place], [place] [address]    Special Requests   Final    NONE Performed at [facility], [address] [place], [place] [address]    Culture MULTIPLE SPECIES PRESENT, SUGGEST  RECOLLECTION (A)  Final   Report Status 12/05/2023 FINAL  Final  Culture, blood (Routine X 2) w Reflex to ID Panel     Status: None (Preliminary result)   Collection Time: 12/03/23 11:39 PM   Specimen: BLOOD  Result Value Ref Range Status   Specimen Description BLOOD left [facility]  Final   Special Requests   Final    BOTTLES DRAWN AEROBIC AND ANAEROBIC Blood Culture results may not be optimal due to an inadequate volume of blood received in culture bottles   Culture   Final    NO GROWTH 4 DAYS Performed at [facility], [address] [place], [place] [address]    Report Status PENDING  Incomplete  Culture, blood (Routine X 2) w Reflex to ID Panel     Status: None (Preliminary result)   Collection Time: 12/03/23 11:45 PM   Specimen: BLOOD  Result Value Ref Range Status   Specimen Description BLOOD left forearm  Final   Special Requests   Final    BOTTLES DRAWN AEROBIC AND ANAEROBIC Blood Culture results may not be optimal due to an inadequate volume of blood received in culture bottles   Culture   Final    NO GROWTH 4 DAYS Performed at [facility], [address] [place], [place] [address]    Report Status PENDING  Incomplete    Labs: CBC: Recent Labs  Lab 12/03/23 1609 12/04/23 0544  WBC 10.9* 8.1  NEUTROABS 8.5*  --   HGB 11.2* 11.6*  HCT 34.8* 36.3  MCV 86.8 87.7  PLT 253 262   Basic Metabolic Panel: Recent Labs  Lab 12/03/23 1609 12/04/23 0544 12/05/23 0319  NA 137 139 138  K 3.1* 4.4 4.3  CL 107 108 107  CO2 20* 23 24  GLUCOSE 102* 117* 102*  BUN 20 19 14   CREATININE 0.65 0.62 0.62  CALCIUM  9.2 9.0 9.0  MG 1.8  --   --   PHOS 1.9* 3.5 3.1   Liver Function Tests: Recent Labs  Lab 12/03/23 1609  AST 54*  ALT 22  ALKPHOS 62  BILITOT 1.1  PROT 6.9  ALBUMIN 3.8   CBG: No results for input(s): GLUCAP in the last 168 hours.  Discharge time spent: {LESS THAN/GREATER UYJW:73611} 30 minutes.  Signed: Burnard DELENA Cunning, DO [facility] 12/07/2023

## 2023-12-07 NOTE — Plan of Care (Signed)

## 2023-12-07 NOTE — Progress Notes (Addendum)
 Discussed SNF bed offers with patient, who prefers a list to view. List below will be given to patient for SNF decision.  [facility] SNF  Accepted -- [address] [place] [address] [phone]    [facility] Preferred SNF  Accepted -- [address] [place] [address] [phone]    [facility] Preferred SNF  Accepted -- [address] [place] [address] [phone]    [facility]  Accepted -- [address], [place] [address] [phone]    [facility]  Accepted -- [address], [place] [address] [phone]     1:40 pm. Spoke with patient who selected [facility] and next [place], if [facility] does not have a bed. Left message for Barnie at [facility], pending confirmation.  3:27 pm: Darian from [facility] Accepted bed offer. SNF Auth process started with [facility].

## 2023-12-08 DIAGNOSIS — I4891 Unspecified atrial fibrillation: Secondary | ICD-10-CM | POA: Diagnosis not present

## 2023-12-08 LAB — CULTURE, BLOOD (ROUTINE X 2)
Culture: NO GROWTH
Culture: NO GROWTH

## 2023-12-08 MED ORDER — MAGNESIUM CHLORIDE 64 MG PO TBEC: 1.0000 | DELAYED_RELEASE_TABLET | Freq: Every day | ORAL | Status: DC

## 2023-12-08 MED ORDER — DILTIAZEM HCL 30 MG PO TABS: 30.0000 mg | ORAL_TABLET | Freq: Three times a day (TID) | ORAL | Status: DC

## 2023-12-08 NOTE — Progress Notes (Signed)
 Progress Note   Patient: Rebecca Anthony FMW:[id number] DOB: 1945-08-19 DOA: 12/03/2023     4 DOS: the patient was seen and examined on 12/08/2023   Brief hospital course: 78 y.o. female with medical history significant of A fib on Eliquis , HTN, HLD, pre-DM, hypothyroidism, obesity, scoliosis and spinal stenosis, chronic pain, IBS, diverticulitis, who presents with bilateral leg weakness and palpitation.     Patient states that she has a chronic back problem with difficulty in lower extremity, but normally she can get around using her cane. In the past 2 days, she feels weaker, particularly in both legs, no fall.  She could not get up from a chair without assistance which is not normal to her.  No leg numbness or leg pain.  No loss control of bladder or bowel movement.  No fall or injury. Pt states that she has palpitation over the past 24 hours.  Denies chest pain, SOB, cough, fever or chills.  No nausea, vomiting, diarrhea or abdominal pain.  Denies symptoms of UTI.   Patient was found to have A-fib with RVR, with heart rate 110-150 in ED.  8/19.  Patient states that her knees give out when she is trying to walk complains of weakness.  Also found to be in rapid atrial fibrillation.  Cardizem  drip stopped secondary to low blood pressure.  Started on low-dose oral Cardizem .   I assumed care on 12/05/23.  Patient's heart rates better controlled, overall in 60-80's Knee x-rays yesterday showed severe arthritis on left, moderate on right. Continuing to work with PT/OT. Patient to discuss with husband about SNF/rehab vs going home with home health.  Further hospital course and management as outlined below.    Assessment and Plan:  * Atrial fibrillation with RVR (HCC) Initially on Cardizem  drip, stopped secondary to low blood pressure.   Heart rates have been well controlled. --Telemetry monitoring --Continue metoprolol  50 mg BID --Low dose Cardizem  PO --Continue Eliquis  --Outpatient  follow up with Cardiology  Bilateral leg weakness MRI of the lumbar spine without cord compression to explain weakness, it does show multi-level generative changes including foraminal stenosis and grade 1 anterolisthesis of L4 on L5. See MRI report for full details. --PT/OT - rehab recommended --Fall precautions  Generalized weakness Patient states that her knees gave out when she walks.  Patient able to straight leg raise.  Left knee x-ray severe tricompartmental arthritis. Right knee x-ray moderate medial arthritis. No unilateral or focal neurologic changes. --PT/OT - SNF recommended - pt agreeable --Fall precautions --TOC working on placement   Leukocytosis - resolved --Monitor CBC   Hypothermia - resolved Urinalysis negative, culture grew multiple species.   Chest x-ray negative. --Monitor clinically   Hypophosphatemia Replaced   Hypokalemia Replaced --Monitor BMP   Essential hypertension --Continue Toprol -XL and low-dose Cardizem    Hypothyroidism --On Synthroid    Myocardial injury - likely due to A-fib RVR. No chest pain. Troponin trending down from 38 to 28.   Obesity (BMI 30.0-34.9) Class I obesity with a BMI of 32.7      Subjective: Pt seated edge of bed, NT assisting her to return to bed after being up for quite a while.  Pt reports overall feeling well.  Her only complaint is foot pain/discomfort overnight, didn't sleep well.  Wonders about neuropathy.  We also discussed potential from nerve impingement in her lower back.  No other acute complaints. Eager to get to rehab.   Physical Exam: Vitals:   12/07/23 2320 12/08/23 0542 12/08/23 9081 12/08/23  1232  BP: (!) 141/100 131/64 128/85 113/72  Pulse: 66 68 77 89  Resp: 19 19 18 20   Temp: 97.8 F (36.6 C) 97.9 F (36.6 C) 97.8 F (36.6 C) 98.4 F (36.9 C)  TempSrc: Oral  Oral   SpO2: 100% 99% 98% 98%  Weight:      Height:       General exam: awake, alert, no acute distress HEENT: atraumatic,  clear conjunctiva, anicteric sclera, moist mucus membranes, hearing grossly normal  Respiratory system: CTAB, no wheezes, rales or rhonchi, normal respiratory effort. Cardiovascular system: normal S1/S2, RRR, no JVD, murmurs, rubs, gallops, no pedal edema.   Gastrointestinal system: soft, NT, ND, no HSM felt, +bowel sounds. Central nervous system: A&O x3. no gross focal neurologic deficits, normal speech Extremities: moves all, no edema, normal tone Skin: dry, intact, normal temperature Psychiatry: normal mood, congruent affect, judgement and insight appear normal      Data Reviewed: No new labs today Notable labs from 8/20 --  Glucose 102 otherwise normal BMP Uric acid normal 5.8    Family Communication: Updated daughter Joselyn by phone 8/21 afternoon. No new medical updates at this time.   Disposition: Status is: Inpatient Remains inpatient appropriate because: needs SNF placement for rehab.    Planned Discharge Destination: Skilled nursing facility vs [facility]    Time spent: 35 minutes  Author: Burnard DELENA Cunning, DO 12/08/2023 3:55 PM  For on call review [email or web address].

## 2023-12-08 NOTE — Plan of Care (Signed)

## 2023-12-09 DIAGNOSIS — I4891 Unspecified atrial fibrillation: Secondary | ICD-10-CM | POA: Diagnosis not present

## 2023-12-09 NOTE — Progress Notes (Signed)
 Progress Note   Patient: Rebecca Anthony FMW:[id number] DOB: 1946/04/06 DOA: 12/03/2023     5 DOS: the patient was seen and examined on 12/09/2023   Brief hospital course: 78 y.o. female with medical history significant of A fib on Eliquis , HTN, HLD, pre-DM, hypothyroidism, obesity, scoliosis and spinal stenosis, chronic pain, IBS, diverticulitis, who presents with bilateral leg weakness and palpitation.     Patient states that she has a chronic back problem with difficulty in lower extremity, but normally she can get around using her cane. In the past 2 days, she feels weaker, particularly in both legs, no fall.  She could not get up from a chair without assistance which is not normal to her.  No leg numbness or leg pain.  No loss control of bladder or bowel movement.  No fall or injury. Pt states that she has palpitation over the past 24 hours.  Denies chest pain, SOB, cough, fever or chills.  No nausea, vomiting, diarrhea or abdominal pain.  Denies symptoms of UTI.   Patient was found to have A-fib with RVR, with heart rate 110-150 in ED.  8/19.  Patient states that her knees give out when she is trying to walk complains of weakness.  Also found to be in rapid atrial fibrillation.  Cardizem  drip stopped secondary to low blood pressure.  Started on low-dose oral Cardizem .   I assumed care on 12/05/23.  Patient's heart rates better controlled, overall in 60-80's Knee x-rays yesterday showed severe arthritis on left, moderate on right. Continuing to work with PT/OT. Patient to discuss with husband about SNF/rehab vs going home with home health.  Further hospital course and management as outlined below.    Assessment and Plan:  * Atrial fibrillation with RVR (HCC) Initially on Cardizem  drip, stopped secondary to low blood pressure.   Heart rates have been well controlled. --Telemetry monitoring --Continue metoprolol  50 mg BID --Low dose Cardizem  PO --Continue Eliquis  --Outpatient  follow up with Cardiology  Bilateral leg weakness MRI of the lumbar spine without cord compression to explain weakness, it does show multi-level generative changes including foraminal stenosis and grade 1 anterolisthesis of L4 on L5. See MRI report for full details. --PT/OT - rehab recommended --Fall precautions  Generalized weakness Patient states that her knees gave out when she walks.  Patient able to straight leg raise.  Left knee x-ray severe tricompartmental arthritis. Right knee x-ray moderate medial arthritis. No unilateral or focal neurologic changes. --PT/OT - SNF recommended - pt agreeable --Fall precautions --TOC working on placement   Leukocytosis - resolved --Monitor CBC   Hypothermia - resolved Urinalysis negative, culture grew multiple species.   Chest x-ray negative. --Monitor clinically   Hypophosphatemia Replaced   Hypokalemia Replaced --Monitor BMP   Essential hypertension --Continue Toprol -XL and low-dose Cardizem    Hypothyroidism --On Synthroid    Myocardial injury - likely due to A-fib RVR. No chest pain. Troponin trending down from 38 to 28.   Obesity (BMI 30.0-34.9) Class I obesity with a BMI of 32.7      Subjective: Pt awake resting in bed on rounds this AM.  She reports sleeping better overnight after Tylenol  given before bed.  Denies other complaints.  Eager to get to rehab.   Physical Exam: Vitals:   12/09/23 0240 12/09/23 0731 12/09/23 1126 12/09/23 1518  BP: 123/64 125/86 100/74 114/73  Pulse: 80 86 85 87  Resp: 20  17 16   Temp: 98.2 F (36.8 C) 98.2 F (36.8 C) 97.6 F (36.4  C) 98.2 F (36.8 C)  TempSrc:  Oral    SpO2: 99% 97% 100% 98%  Weight:      Height:       General exam: awake, alert, no acute distress HEENT: atraumatic, clear conjunctiva, anicteric sclera, moist mucus membranes, hearing grossly normal  Respiratory system: on room air, normal respiratory effort. Cardiovascular system: RRR Central nervous system:  A&O x3. no gross focal neurologic deficits, normal speech Extremities: moves all, no edema, normal tone Psychiatry: normal mood, congruent affect, judgement and insight appear normal      Data Reviewed: No new labs today Notable labs from 8/20 --  Glucose 102 otherwise normal BMP Uric acid normal 5.8    Family Communication: Updated daughter Joselyn by phone 8/21 afternoon. No new medical updates at this time.   Disposition: Status is: Inpatient Remains inpatient appropriate because: needs SNF placement for rehab.    Planned Discharge Destination: Skilled nursing facility vs [facility]    Time spent: 35 minutes  Author: Burnard DELENA Cunning, DO 12/09/2023 3:51 PM  For on call review [email or web address].

## 2023-12-10 ENCOUNTER — Encounter: Payer: Self-pay | Admitting: Internal Medicine

## 2023-12-10 DIAGNOSIS — I4891 Unspecified atrial fibrillation: Secondary | ICD-10-CM | POA: Diagnosis not present

## 2023-12-10 NOTE — Progress Notes (Signed)
 Called report to the facility to RN  at [facility] (Room 503)

## 2023-12-10 NOTE — TOC Transition Note (Signed)
 Transition of Care [facility]) - Discharge Note   Patient Details  Name: Rebecca Anthony MRN: [id number] Date of Birth: Nov 11, 1945  [facility]) CM/SW Contact:  Racheal LITTIE Schimke, RN Phone Number: 12/10/2023, 10:27 AM   Final next level of care: Skilled Nursing Facility Barriers to Discharge: Barriers Resolved   Patient Goals and CMS Choice Patient states their goals for this hospitalization and ongoing recovery are:: To SNF CMS Medicare.gov Compare Post Acute Care list provided to:: Patient Choice offered to / list presented to : Patient, Spouse      Discharge Placement              Patient chooses bed at: [facility] (Room 503)     Patient and family notified of of transfer: 12/09/23  Discharge Plan and Services Additional resources added to the After Visit Summary for       Post Acute Care Choice: Skilled Nursing Facility          DME Arranged: N/A DME Agency: NA       HH Arranged: NA HH Agency: NA        Social Drivers of Health (SDOH) Interventions SDOH Screenings   Food Insecurity: No Food Insecurity (12/04/2023)  Housing: Low Risk  (12/04/2023)  Transportation Needs: No Transportation Needs (12/04/2023)  Utilities: Not At Risk (12/04/2023)  Alcohol Screen: Low Risk  (12/15/2022)  Depression (PHQ2-9): Low Risk  (09/06/2023)  Financial Resource Strain: Low Risk  (09/07/2022)  Physical Activity: Insufficiently Active (09/07/2022)  Social Connections: Moderately Isolated (12/04/2023)  Stress: No Stress Concern Present (09/07/2022)  Tobacco Use: Low Risk  (12/04/2023)     Readmission Risk Interventions     No data to display

## 2023-12-10 NOTE — Plan of Care (Signed)

## 2023-12-10 NOTE — Progress Notes (Signed)
 PT Cancellation Note  Patient Details Name: Rebecca Anthony MRN: [id number] DOB: Jun 04, 1945   Cancelled Treatment:    Reason Eval/Treat Not Completed: Other (comment). Upon entry pt politely declining PT as she is discharging to [facility] today.    Dorina HERO. Fairly IV, PT, DPT Physical Therapist- [place]  [facility] 12/10/2023, 9:57 AM

## 2023-12-10 NOTE — Plan of Care (Signed)
  Problem: Activity: Goal: Risk for activity intolerance will decrease Outcome: Progressing   Problem: Nutrition: Goal: Adequate nutrition will be maintained Outcome: Progressing   Problem: Elimination: Goal: Will not experience complications related to urinary retention Outcome: Progressing   Problem: Pain Managment: Goal: General experience of comfort will improve and/or be controlled Outcome: Progressing   Problem: Safety: Goal: Ability to remain free from injury will improve Outcome: Progressing   Problem: Skin Integrity: Goal: Risk for impaired skin integrity will decrease Outcome: Progressing

## 2023-12-12 ENCOUNTER — Ambulatory Visit: Admitting: Podiatry

## 2023-12-13 ENCOUNTER — Ambulatory Visit: Payer: Medicare Other | Admitting: Nurse Practitioner

## 2023-12-24 ENCOUNTER — Telehealth: Payer: Self-pay

## 2023-12-24 ENCOUNTER — Inpatient Hospital Stay: Admitting: Nurse Practitioner

## 2023-12-24 NOTE — Transitions of Care (Post Inpatient/ED Visit) (Unsigned)
   12/24/2023  Name: AIXA CORSELLO MRN: [id number] DOB: 1946/02/13  Today's TOC FU Call Status: Today's TOC FU Call Status:: Unsuccessful Call (1st Attempt) Unsuccessful Call (1st Attempt) Date: 12/24/23  Attempted to reach the patient regarding the most recent Inpatient/ED visit.  Follow Up Plan: Additional outreach attempts will be made to reach the patient to complete the Transitions of Care (Post Inpatient/ED visit) call.   Signature Julian Lemmings, LPN [facility] Nurse Health Advisor Direct Dial [phone]

## 2023-12-24 NOTE — Progress Notes (Deleted)
 There were no vitals taken for this visit.   Subjective:    Patient ID: Lawrnce KATHEE Anthony, female    DOB: April 25, 1945, 78 y.o.   MRN: [id number]  HPI: Rebecca Anthony is a 78 y.o. female  No chief complaint on file.  Discussed the use of AI scribe software for clinical note transcription with the patient, who gave verbal consent to proceed.  History of Present Illness     Admit date:     12/03/2023  Discharge date: 12/10/23  Discharge Physician: Rebecca Anthony    PCP: Rebecca Mliss FALCON, FNP    Recommendations at discharge:    Follow up with Primary Care in 1-2 weeks Follow up with Cardiology as scheduled Follow up on knee arthritis and consider Orthopedic referral / follow up Continue PT/OT at rehab Repeat CBC/BMP, Mg, Phos at follow up   Discharge Diagnoses: Active Problems:   Generalized weakness   Essential hypertension   Hypothyroidism   Myocardial injury   Bilateral leg weakness   Obesity (BMI 30.0-34.9)   Principal Problem (Resolved):   Atrial fibrillation with RVR (HCC) Resolved Problems:   Hypothermia   Leukocytosis   Hypokalemia   Hypophosphatemia   Hospital Course:   HPI on admission: 78 y.o. female with medical history significant of A fib on Eliquis , HTN, HLD, pre-DM, hypothyroidism, obesity, scoliosis and spinal stenosis, chronic pain, IBS, diverticulitis, who presents with bilateral leg weakness and palpitations.   Patient states that she has a chronic back problem with difficulty in lower extremity, but normally she can get around using her cane. In the past 2 days, she feels weaker, particularly in both legs, no fall.  She could not get up from a chair without assistance which is not normal to her.  No leg numbness or leg pain.  No loss control of bladder or bowel movement.  No fall or injury. Pt states that she has palpitation over the past 24 hours.  Denies chest pain, SOB, cough, fever or chills.  No nausea, vomiting, diarrhea or abdominal pain.   Denies symptoms of UTI.   Patient was found to have A-fib with RVR, with heart rate 110-150 in ED.  Placed on Cardizem  drip initially.   8/19.  Patient states that her knees give out when she is trying to walk complains of weakness.  Also found to be in rapid atrial fibrillation.  Cardizem  drip stopped secondary to low blood pressure.  Started on low-dose oral Cardizem .     I assumed care on 12/05/23.   Further hospital course and management as outlined below.   8/20 -- 8/25 Patient's heart rates have remained well-controlled, overall in 60-80's Knee x-rays on 8/19 showed severe arthritis on left, moderate on right. Continuing to work with PT/OT.  Awaiting rehab placement.     8/25 -- patient remains medically stable for discharge today & SNF authorization has been obtained.  Pt seen on rounds and has no acute complaints.  Agreeable for d/c today.     Assessment and Plan:   * Atrial fibrillation with RVR (HCC) Initially on Cardizem  drip, stopped secondary to low blood pressure.   Heart rates have been well controlled. --Telemetry monitoring --Continue metoprolol  50 mg BID --Low dose Cardizem  30 mg PO TID --Continue Eliquis  5 mg BID for stroke risk reduction --Outpatient follow up with Cardiology as scheduled --Recommend maintain K>4, Mg>2   Bilateral leg weakness Generalized weakness Bilateral knee arthritis MRI of the lumbar spine without cord compression to explain  weakness, it does show multi-level generative changes including foraminal stenosis and grade 1 anterolisthesis of L4 on L5. See MRI report for full details. Patient states that her knees gave out when she walks.  Patient able to straight leg raise.  Left knee x-ray severe tricompartmental arthritis. Right knee x-ray moderate medial arthritis. No unilateral or focal neurologic changes.   --PT/OT - SNF recommended - pt agreeable --Fall precautions --TOC working on placement   Leukocytosis - resolved --Monitor  CBC   Hypothermia - resolved Urinalysis negative, culture grew multiple species.   Chest x-ray negative.   Hypophosphatemia Replaced & resolved.  Check phos level in follow up, replace phos PRN.   Hypokalemia Replaced & resolved.  Monitor BMP in follow up, replace K PRN   Essential hypertension --Continue Toprol -XL and Cardizem  as above   Hypothyroidism --On Synthroid    Myocardial injury - likely due to A-fib RVR. No chest pain. Troponin trending down from 38 to 28.   Obesity (BMI 30.0-34.9) Class I obesity with a BMI of 32.7 Complicates overall care and prognosis.  Recommend lifestyle modifications including physical activity and diet for weight loss and overall long-term health.          Med Rec Medication List       STOP taking these medications     telmisartan  40 MG tablet Commonly known as: MICARDIS            TAKE these medications     ascorbic acid  500 MG tablet Commonly known as: VITAMIN C  Take 500 mg by mouth daily.    calcium  carbonate 1500 (600 Ca) MG Tabs tablet Commonly known as: OSCAL Take by mouth.    diltiazem  30 MG tablet Commonly known as: CARDIZEM  Take 1 tablet (30 mg total) by mouth 3 (three) times daily.    Eliquis  5 MG Tabs tablet Generic drug: apixaban  TAKE 1 TABLET BY MOUTH TWICE A DAY    gabapentin  300 MG capsule Commonly known as: NEURONTIN  TAKE 1 CAPSULE BY MOUTH EVERYDAY AT BEDTIME    HORSE CHESTNUT PO Take 1 tablet by mouth daily.    levothyroxine  50 MCG tablet Commonly known as: SYNTHROID  TAKE 1 TABLET BY MOUTH EVERY DAY BEFORE BREAKFAST    magnesium  chloride 64 MG Tbec SR tablet Commonly known as: SLOW-MAG Take 1 tablet (64 mg total) by mouth at bedtime.    methocarbamol  750 MG tablet Commonly known as: ROBAXIN  Take 1 tablet (750 mg total) by mouth 2 (two) times daily as needed for muscle spasms.    metoprolol  succinate 50 MG 24 hr tablet Commonly known as: TOPROL -XL TAKE 1 TABLET (50 MG TOTAL) BY MOUTH TWICE  A DAY .TAKE WITH OR IMMEDIATELY FOLLOWING A MEAL    nystatin  powder Commonly known as: MYCOSTATIN /NYSTOP  Apply 1 Application topically 3 (three) times daily.    polyethylene glycol powder 17 GM/SCOOP powder Commonly known as: GLYCOLAX /MIRALAX  Take 17 g by mouth daily. Dissolve 1 capful in 8 oz of liquid daily PRN What changed:  when to take this reasons to take this additional instructions    Tylenol  8 Hour Arthritis Pain 650 MG CR tablet Generic drug: acetaminophen  Take 650 mg by mouth every 8 (eight) hours as needed for pain.    VITAMIN D  (CHOLECALCIFEROL) PO       EXAM: CHEST - 2 VIEW   COMPARISON:  None Available.   FINDINGS: Frontal and lateral views of the chest demonstrate an enlarged cardiac silhouette. No airspace disease, effusion, or pneumothorax. No acute bony abnormalities.  IMPRESSION: 1. Enlarged cardiac silhouette.  No acute airspace disease.  EXAM: MRI LUMBAR SPINE WITHOUT AND WITH CONTRAST   TECHNIQUE: Multiplanar and multiecho pulse sequences of the lumbar spine were obtained without and with intravenous contrast.   CONTRAST:  10mL GADAVIST  GADOBUTROL  1 MMOL/ML IV SOLN   COMPARISON:  Lumbar radiographs June 30, 2019.   FINDINGS: Segmentation: Standard.   Alignment: Rotatory levocurvature. Grade 1 anterolisthesis of L4 on L5.   Vertebrae: No fracture, evidence of discitis, or suspicious bone lesion.   Conus medullaris and cauda equina: Conus extends to the L1-L2 level. Conus and cauda equina appear normal.   Paraspinal and other soft tissues: Unremarkable.   Disc levels:   T12-L1: Mild disc bulging. Mild facet arthropathy. No significant stenosis.   L1-L2: Mild disc bulging, ligamentum flavum thickening and facet arthropathy. Mild right foraminal stenosis. Patent canal and left foramen.   L2-L3: Broad disc bulging with ligamentum flavum thickening and facet arthropathy. Resulting mild canal stenosis and mild right foraminal  stenosis.   L3-L4: Disc bulging with left eye facet arthropathy. Resulting moderate left and mild right foraminal stenosis. Mild left subarticular recess stenosis.   L4-L5: Grade 1 anterolisthesis. Left greater than right facet arthropathy. Resulting mild left subarticular recess stenosis and mild left foraminal stenosis.   L5-S1: Disc bulging and left eccentric endplate spurring. Left greater than right facet arthropathy. Resulting mild left foraminal stenosis. Patent canal.   IMPRESSION: 1. At L3-L4, moderate left and mild right foraminal stenosis. Mild left subarticular recess stenosis. 2. At L4-L5, mild left subarticular recess stenosis and mild left foraminal stenosis. 3. At L5-S1, mild left foraminal stenosis. 4. At L2-L3, mild canal stenosis and mild right foraminal stenosis. 5. Rotatory levocurvature.  Grade 1 anterolisthesis of L4 on L5.  EXAM: LEFT KNEE - 1-2 VIEW   COMPARISON:  None Available.   FINDINGS: No fracture or malalignment. Tricompartment arthritis with mild to moderate lateral tibiofemoral disease. No significant effusion   IMPRESSION: Tricompartment arthritis, no acute osseous abnormality  EXAM: RIGHT KNEE - 1-2 VIEW   COMPARISON:  None Available.   FINDINGS: No fracture or malalignment. Moderate lateral tibiofemoral joint space narrowing and degenerative change. No significant knee effusion   IMPRESSION: Moderate lateral tibiofemoral degenerative change.      09/06/2023    1:27 PM 12/15/2022    2:03 PM 09/07/2022    3:21 PM  Depression screen PHQ 2/9  Decreased Interest 0 0 0  Down, Depressed, Hopeless 0 0 0  PHQ - 2 Score 0 0 0  Altered sleeping 0    Tired, decreased energy 0    Change in appetite 0    Feeling bad or failure about yourself  0    Trouble concentrating 0    Moving slowly or fidgety/restless 0    Suicidal thoughts 0    PHQ-9 Score 0    Difficult doing work/chores Not difficult at all      Relevant past medical,  surgical, family and social history reviewed and updated as indicated. Interim medical history since our last visit reviewed. Allergies and medications reviewed and updated.  Review of Systems  Per HPI unless specifically indicated above     Objective:      There were no vitals taken for this visit.  {Vitals History (Optional):23777} Wt Readings from Last 3 Encounters:  12/04/23 222 lb 0.1 oz (100.7 kg)  09/06/23 229 lb (103.9 kg)  08/09/23 223 lb 8 oz (101.4 kg)    Physical Exam  Results for orders placed or performed during the hospital encounter of 12/03/23  Urine Culture (for pregnant, neutropenic or urologic patients or patients with an indwelling urinary catheter)   Collection Time: 12/03/23  4:09 PM   Specimen: Urine, Random  Result Value Ref Range   Specimen Description      URINE, RANDOM Performed at [facility], [address] [place], [place] [address]    Special Requests      NONE Performed at [facility], [address] [place], [place] [address]    Culture MULTIPLE SPECIES PRESENT, SUGGEST RECOLLECTION (A)    Report Status 12/05/2023 FINAL   CBC with Differential   Collection Time: 12/03/23  4:09 PM  Result Value Ref Range   WBC 10.9 (H) 4.0 - 10.5 K/uL   RBC 4.01 3.87 - 5.11 MIL/uL   Hemoglobin 11.2 (L) 12.0 - 15.0 g/dL   HCT 65.1 (L) 63.9 - 53.9 %   MCV 86.8 80.0 - 100.0 fL   MCH 27.9 26.0 - 34.0 pg   MCHC 32.2 30.0 - 36.0 g/dL   RDW 86.9 88.4 - 84.4 %   Platelets 253 150 - 400 K/uL   nRBC 0.0 0.0 - 0.2 %   Neutrophils Relative % 78 %   Neutro Abs 8.5 (H) 1.7 - 7.7 K/uL   Lymphocytes Relative 11 %   Lymphs Abs 1.2 0.7 - 4.0 K/uL   Monocytes Relative 10 %   Monocytes Absolute 1.1 (H) 0.1 - 1.0 K/uL   Eosinophils Relative 0 %   Eosinophils Absolute 0.0 0.0 - 0.5 K/uL   Basophils Relative 0 %   Basophils Absolute 0.0 0.0 - 0.1 K/uL   Immature Granulocytes 1 %   Abs Immature Granulocytes 0.05 0.00 - 0.07 K/uL   Comprehensive metabolic panel   Collection Time: 12/03/23  4:09 PM  Result Value Ref Range   Sodium 137 135 - 145 mmol/L   Potassium 3.1 (L) 3.5 - 5.1 mmol/L   Chloride 107 98 - 111 mmol/L   CO2 20 (L) 22 - 32 mmol/L   Glucose, Bld 102 (H) 70 - 99 mg/dL   BUN 20 8 - 23 mg/dL   Creatinine, Ser 9.34 0.44 - 1.00 mg/dL   Calcium  9.2 8.9 - 10.3 mg/dL   Total Protein 6.9 6.5 - 8.1 g/dL   Albumin 3.8 3.5 - 5.0 g/dL   AST 54 (H) 15 - 41 U/L   ALT 22 0 - 44 U/L   Alkaline Phosphatase 62 38 - 126 U/L   Total Bilirubin 1.1 0.0 - 1.2 mg/dL   GFR, Estimated >39 >39 mL/min   Anion gap 10 5 - 15  Magnesium    Collection Time: 12/03/23  4:09 PM  Result Value Ref Range   Magnesium  1.8 1.7 - 2.4 mg/dL  TSH   Collection Time: 12/03/23  4:09 PM  Result Value Ref Range   TSH 1.830 0.350 - 4.500 uIU/mL  T4, free   Collection Time: 12/03/23  4:09 PM  Result Value Ref Range   Free T4 1.11 0.61 - 1.12 ng/dL  Urinalysis, Routine w reflex microscopic -Urine, Clean Catch   Collection Time: 12/03/23  4:09 PM  Result Value Ref Range   Color, Urine YELLOW (A) YELLOW   APPearance HAZY (A) CLEAR   Specific Gravity, Urine 1.025 1.005 - 1.030   pH 5.0 5.0 - 8.0   Glucose, UA NEGATIVE NEGATIVE mg/dL   Hgb urine dipstick NEGATIVE NEGATIVE   Bilirubin Urine NEGATIVE NEGATIVE  Ketones, ur NEGATIVE NEGATIVE mg/dL   Protein, ur 30 (A) NEGATIVE mg/dL   Nitrite NEGATIVE NEGATIVE   Leukocytes,Ua TRACE (A) NEGATIVE   RBC / HPF 11-20 0 - 5 RBC/hpf   WBC, UA 0-5 0 - 5 WBC/hpf   Bacteria, UA FEW (A) NONE SEEN   Squamous Epithelial / HPF 0-5 0 - 5 /HPF   Hyaline Casts, UA PRESENT   Procalcitonin   Collection Time: 12/03/23  4:09 PM  Result Value Ref Range   Procalcitonin 0.21 ng/mL  Sedimentation rate   Collection Time: 12/03/23  4:09 PM  Result Value Ref Range   Sed Rate 13 0 - 22 mm/hr  Phosphorus   Collection Time: 12/03/23  4:09 PM  Result Value Ref Range   Phosphorus 1.9 (L) 2.5 - 4.6 mg/dL   Troponin I (High Sensitivity)   Collection Time: 12/03/23  4:09 PM  Result Value Ref Range   Troponin I (High Sensitivity) 33 (H) <18 ng/L  Troponin I (High Sensitivity)   Collection Time: 12/03/23  5:37 PM  Result Value Ref Range   Troponin I (High Sensitivity) 38 (H) <18 ng/L  Culture, blood (Routine X 2) w Reflex to ID Panel   Collection Time: 12/03/23 11:39 PM   Specimen: BLOOD  Result Value Ref Range   Specimen Description BLOOD left AC    Special Requests      BOTTLES DRAWN AEROBIC AND ANAEROBIC Blood Culture results may not be optimal due to an inadequate volume of blood received in culture bottles   Culture      NO GROWTH 5 DAYS Performed at [facility], [address] [place], [place] [address]    Report Status 12/08/2023 FINAL   Troponin I (High Sensitivity)   Collection Time: 12/03/23 11:39 PM  Result Value Ref Range   Troponin I (High Sensitivity) 28 (H) <18 ng/L  Culture, blood (Routine X 2) w Reflex to ID Panel   Collection Time: 12/03/23 11:45 PM   Specimen: BLOOD  Result Value Ref Range   Specimen Description BLOOD left forearm    Special Requests      BOTTLES DRAWN AEROBIC AND ANAEROBIC Blood Culture results may not be optimal due to an inadequate volume of blood received in culture bottles   Culture      NO GROWTH 5 DAYS Performed at [facility], [address] [place], [place] [address]    Report Status 12/08/2023 FINAL   C-reactive protein   Collection Time: 12/03/23 11:45 PM  Result Value Ref Range   CRP 6.4 (H) <1.0 mg/dL  Cortisol   Collection Time: 12/03/23 11:45 PM  Result Value Ref Range   Cortisol, Plasma 17.4 ug/dL  Basic metabolic panel   Collection Time: 12/04/23  5:44 AM  Result Value Ref Range   Sodium 139 135 - 145 mmol/L   Potassium 4.4 3.5 - 5.1 mmol/L   Chloride 108 98 - 111 mmol/L   CO2 23 22 - 32 mmol/L   Glucose, Bld 117 (H) 70 - 99 mg/dL   BUN 19 8 - 23 mg/dL   Creatinine, Ser 9.37 0.44 - 1.00 mg/dL    Calcium  9.0 8.9 - 10.3 mg/dL   GFR, Estimated >39 >39 mL/min   Anion gap 8 5 - 15  CBC   Collection Time: 12/04/23  5:44 AM  Result Value Ref Range   WBC 8.1 4.0 - 10.5 K/uL   RBC 4.14 3.87 - 5.11 MIL/uL   Hemoglobin 11.6 (L) 12.0 -  15.0 g/dL   HCT 63.6 63.9 - 53.9 %   MCV 87.7 80.0 - 100.0 fL   MCH 28.0 26.0 - 34.0 pg   MCHC 32.0 30.0 - 36.0 g/dL   RDW 86.9 88.4 - 84.4 %   Platelets 262 150 - 400 K/uL   nRBC 0.0 0.0 - 0.2 %  Phosphorus   Collection Time: 12/04/23  5:44 AM  Result Value Ref Range   Phosphorus 3.5 2.5 - 4.6 mg/dL  Uric acid   Collection Time: 12/05/23  3:19 AM  Result Value Ref Range   Uric Acid, Serum 5.8 2.5 - 7.1 mg/dL  Basic metabolic panel   Collection Time: 12/05/23  3:19 AM  Result Value Ref Range   Sodium 138 135 - 145 mmol/L   Potassium 4.3 3.5 - 5.1 mmol/L   Chloride 107 98 - 111 mmol/L   CO2 24 22 - 32 mmol/L   Glucose, Bld 102 (H) 70 - 99 mg/dL   BUN 14 8 - 23 mg/dL   Creatinine, Ser 9.37 0.44 - 1.00 mg/dL   Calcium  9.0 8.9 - 10.3 mg/dL   GFR, Estimated >39 >39 mL/min   Anion gap 7 5 - 15  Phosphorus   Collection Time: 12/05/23  3:19 AM  Result Value Ref Range   Phosphorus 3.1 2.5 - 4.6 mg/dL   {Labs (Neupnwjo):76220}       Assessment & Plan:   Problem List Items Addressed This Visit       Other   Generalized weakness   Other Visit Diagnoses       PAF (paroxysmal atrial fibrillation) (HCC)    -  Primary     Hospital discharge follow-up            Assessment and Plan Assessment & Plan         Follow up plan: No follow-ups on file.

## 2023-12-25 ENCOUNTER — Telehealth: Payer: Self-pay

## 2023-12-25 NOTE — Telephone Encounter (Signed)
 Copied from CRM [id number]. Topic: Clinical - Medical Advice >> Dec 25, 2023  1:34 PM Sophia H wrote: Reason for CRM: Patient states she was recently discharged from rehab and wanted to follow up with her pcp. States has some questions about medications/next steps. Requested a call back from nurse or FNP Mliss Spray. Did not want to schedule till she speaks with clinic directly. # [phone]

## 2023-12-25 NOTE — Telephone Encounter (Signed)
 Called pt and she wanted to speak to a CMA, tranasferred call to Jamie

## 2023-12-25 NOTE — Telephone Encounter (Signed)
 Pt called appt scheduled, requested copy of all meds though pharmacy, they will send

## 2023-12-25 NOTE — Transitions of Care (Post Inpatient/ED Visit) (Signed)
   12/25/2023  Name: Rebecca Anthony MRN: [id number] DOB: [birth year]  Today's TOC FU Call Status: Today's TOC FU Call Status:: Unsuccessful Call (1st Attempt) Unsuccessful Call (1st Attempt) Date: 12/24/23  Attempted to reach the patient regarding the most recent Inpatient/ED visit.  Follow Up Plan: No further outreach attempts will be made at this time. We have been unable to contact the patient. Patient already seen in office Signature Julian Lemmings, LPN [facility] Nurse Health Advisor Direct Dial [phone]

## 2024-01-02 ENCOUNTER — Ambulatory Visit: Admitting: Nurse Practitioner

## 2024-01-04 ENCOUNTER — Ambulatory Visit: Admitting: Nurse Practitioner

## 2024-01-09 ENCOUNTER — Encounter: Payer: Self-pay | Admitting: Nurse Practitioner

## 2024-01-09 ENCOUNTER — Ambulatory Visit: Admitting: Nurse Practitioner

## 2024-01-09 VITALS — BP 126/72 | HR 98 | Temp 98.0°F | Resp 18 | Ht 69.0 in

## 2024-01-09 DIAGNOSIS — I700 Atherosclerosis of aorta: Secondary | ICD-10-CM

## 2024-01-09 DIAGNOSIS — M170 Bilateral primary osteoarthritis of knee: Secondary | ICD-10-CM | POA: Insufficient documentation

## 2024-01-09 DIAGNOSIS — E039 Hypothyroidism, unspecified: Secondary | ICD-10-CM

## 2024-01-09 DIAGNOSIS — I10 Essential (primary) hypertension: Secondary | ICD-10-CM

## 2024-01-09 DIAGNOSIS — E785 Hyperlipidemia, unspecified: Secondary | ICD-10-CM

## 2024-01-09 DIAGNOSIS — I482 Chronic atrial fibrillation, unspecified: Secondary | ICD-10-CM

## 2024-01-09 DIAGNOSIS — R7303 Prediabetes: Secondary | ICD-10-CM

## 2024-01-09 DIAGNOSIS — E66811 Obesity, class 1: Secondary | ICD-10-CM

## 2024-01-09 NOTE — Progress Notes (Signed)
 BP 126/72   Pulse 98   Temp 98 F (36.7 C)   Resp 18   Ht 5' 9 (1.753 m)   SpO2 96%   BMI 32.78 kg/m    Subjective:    [name], female    DOB: [birth year], 78 y.o.   MRN: [id number]  HPI: Rebecca Anthony is a 78 y.o. female  Chief Complaint  Patient presents with   Medical Management of Chronic Issues    Discuss medication weather she should continue magnesium  and diltiazem .  Also discuss PT and home  health    Hospitalization Follow-up    F/u rehab   Discussed the use of AI scribe software for clinical note transcription with the patient, who gave verbal consent to proceed.  History of Present Illness Rebecca Anthony is a 78 year old female with atrial fibrillation and knee osteoarthritis who presents for follow-up after hospitalization for generalized weakness.  Generalized weakness and gait instability - Hospitalized on December 03, 2023, for generalized weakness and atrial fibrillation with rapid ventricular response - Experienced an episode during hospitalization where knees and legs gave way, resulting in near-fall and inability to stand - Described the episode as frightening and associated with significant weakness - Required emergency services assistance and subsequent hospital admission - Hospital stay lasted approximately one week, followed by nearly two weeks of rehabilitation at [facility] skilled nursing facility - Currently uses a walker and wheelchair for mobility - Has not yet started physical therapy but has been contacted by [facility] regarding home visits and therapy - Interested in starting physical therapy to improve mobility  Atrial fibrillation and cardiovascular concerns - History of atrial fibrillation with rapid ventricular response during recent hospitalization - Current medications include metoprolol  50 mg twice daily, Eliquis  5 mg twice daily, and Cardizem  30 mg three times daily - Chest x-ray during  hospitalization showed enlarged cardiac silhouette with normal lung fields - Concerned about blood pressure and heart rate control - Last cardiology follow-up was on August 09, 2023; advised to schedule another appointment  Lower extremity osteoarthritis and spinal stenosis - History of osteoarthritis in both knees - Knee x-rays revealed arthritis - MRI of the spine showed stenosis at L3-L4, L4-L5, L5-S1, and L2-L3 - Mobility limited, requiring assistive devices (walker, wheelchair)  Neuropathic symptoms and gabapentin  use - Gabapentin  dose increased to 400 mg three times daily during hospitalization; currently taking 400 mg once daily - No specific neuropathic symptoms detailed in this encounter  Laboratory and imaging findings - Phosphorus level was low at 1.9 on December 03, 2023, improved to 3.1 by December 05, 2023 - CBC showed stable anemia - Normal cortisol, BMP, and uric acid levels  Chronic medical conditions - History of hypothyroidism, hypertension, dyslipidemia, prediabetes, and obesity     Admit date:     12/03/2023  Discharge date: 12/10/23  Discharge Physician: Burnard DELENA Cunning    PCP: Gareth Mliss FALCON, FNP    Recommendations at discharge:    Follow up with Primary Care in 1-2 weeks Follow up with Cardiology as scheduled Follow up on knee arthritis and consider Orthopedic referral / follow up Continue PT/OT at rehab Repeat CBC/BMP, Mg, Phos at follow up   Discharge Diagnoses: Active Problems:   Generalized weakness   Essential hypertension   Hypothyroidism   Myocardial injury   Bilateral leg weakness   Obesity (BMI 30.0-34.9)   Principal Problem (Resolved):   Atrial fibrillation with RVR (  HCC) Resolved Problems:   Hypothermia   Leukocytosis   Hypokalemia   Hypophosphatemia   Hospital Course:   HPI on admission: 78 y.o. female with medical history significant of A fib on Eliquis , HTN, HLD, pre-DM, hypothyroidism, obesity, scoliosis and spinal  stenosis, chronic pain, IBS, diverticulitis, who presents with bilateral leg weakness and palpitations.   Patient states that she has a chronic back problem with difficulty in lower extremity, but normally she can get around using her cane. In the past 2 days, she feels weaker, particularly in both legs, no fall.  She could not get up from a chair without assistance which is not normal to her.  No leg numbness or leg pain.  No loss control of bladder or bowel movement.  No fall or injury. Pt states that she has palpitation over the past 24 hours.  Denies chest pain, SOB, cough, fever or chills.  No nausea, vomiting, diarrhea or abdominal pain.  Denies symptoms of UTI.   Patient was found to have A-fib with RVR, with heart rate 110-150 in ED.  Placed on Cardizem  drip initially.   8/19.  Patient states that her knees give out when she is trying to walk complains of weakness.  Also found to be in rapid atrial fibrillation.  Cardizem  drip stopped secondary to low blood pressure.  Started on low-dose oral Cardizem .     I assumed care on 12/05/23.   Further hospital course and management as outlined below.   8/20 -- 8/25 Patient's heart rates have remained well-controlled, overall in 60-80's Knee x-rays on 8/19 showed severe arthritis on left, moderate on right. Continuing to work with PT/OT.  Awaiting rehab placement.     8/25 -- patient remains medically stable for discharge today & SNF authorization has been obtained.  Pt seen on rounds and has no acute complaints.  Agreeable for d/c today.     Assessment and Plan:   * Atrial fibrillation with RVR (HCC) Initially on Cardizem  drip, stopped secondary to low blood pressure.   Heart rates have been well controlled. --Telemetry monitoring --Continue metoprolol  50 mg BID --Low dose Cardizem  30 mg PO TID --Continue Eliquis  5 mg BID for stroke risk reduction --Outpatient follow up with Cardiology as scheduled --Recommend maintain K>4, Mg>2    Bilateral leg weakness Generalized weakness Bilateral knee arthritis MRI of the lumbar spine without cord compression to explain weakness, it does show multi-level generative changes including foraminal stenosis and grade 1 anterolisthesis of L4 on L5. See MRI report for full details. Patient states that her knees gave out when she walks.  Patient able to straight leg raise.  Left knee x-ray severe tricompartmental arthritis. Right knee x-ray moderate medial arthritis. No unilateral or focal neurologic changes.   --PT/OT - SNF recommended - pt agreeable --Fall precautions --TOC working on placement   Leukocytosis - resolved --Monitor CBC   Hypothermia - resolved Urinalysis negative, culture grew multiple species.   Chest x-ray negative.   Hypophosphatemia Replaced & resolved.  Check phos level in follow up, replace phos PRN.   Hypokalemia Replaced & resolved.  Monitor BMP in follow up, replace K PRN   Essential hypertension --Continue Toprol -XL and Cardizem  as above   Hypothyroidism --On Synthroid    Myocardial injury - likely due to A-fib RVR. No chest pain. Troponin trending down from 38 to 28.   Obesity (BMI 30.0-34.9) Class I obesity with a BMI of 32.7 Complicates overall care and prognosis.  Recommend lifestyle modifications including physical activity and  diet for weight loss and overall long-term health.        EXAM: LEFT KNEE - 1-2 VIEW   COMPARISON:  None Available.   FINDINGS: No fracture or malalignment. Tricompartment arthritis with mild to moderate lateral tibiofemoral disease. No significant effusion   IMPRESSION: Tricompartment arthritis, no acute osseous abnormality  EXAM: RIGHT KNEE - 1-2 VIEW   COMPARISON:  None Available.   FINDINGS: No fracture or malalignment. Moderate lateral tibiofemoral joint space narrowing and degenerative change. No significant knee effusion   IMPRESSION: Moderate lateral tibiofemoral degenerative change.    EXAM: MRI LUMBAR SPINE WITHOUT AND WITH CONTRAST   TECHNIQUE: Multiplanar and multiecho pulse sequences of the lumbar spine were obtained without and with intravenous contrast.   CONTRAST:  10mL GADAVIST  GADOBUTROL  1 MMOL/ML IV SOLN   COMPARISON:  Lumbar radiographs June 30, 2019.   FINDINGS: Segmentation: Standard.   Alignment: Rotatory levocurvature. Grade 1 anterolisthesis of L4 on L5.   Vertebrae: No fracture, evidence of discitis, or suspicious bone lesion.   Conus medullaris and cauda equina: Conus extends to the L1-L2 level. Conus and cauda equina appear normal.   Paraspinal and other soft tissues: Unremarkable.   Disc levels:   T12-L1: Mild disc bulging. Mild facet arthropathy. No significant stenosis.   L1-L2: Mild disc bulging, ligamentum flavum thickening and facet arthropathy. Mild right foraminal stenosis. Patent canal and left foramen.   L2-L3: Broad disc bulging with ligamentum flavum thickening and facet arthropathy. Resulting mild canal stenosis and mild right foraminal stenosis.   L3-L4: Disc bulging with left eye facet arthropathy. Resulting moderate left and mild right foraminal stenosis. Mild left subarticular recess stenosis.   L4-L5: Grade 1 anterolisthesis. Left greater than right facet arthropathy. Resulting mild left subarticular recess stenosis and mild left foraminal stenosis.   L5-S1: Disc bulging and left eccentric endplate spurring. Left greater than right facet arthropathy. Resulting mild left foraminal stenosis. Patent canal.   IMPRESSION: 1. At L3-L4, moderate left and mild right foraminal stenosis. Mild left subarticular recess stenosis. 2. At L4-L5, mild left subarticular recess stenosis and mild left foraminal stenosis. 3. At L5-S1, mild left foraminal stenosis. 4. At L2-L3, mild canal stenosis and mild right foraminal stenosis. 5. Rotatory levocurvature.  Grade 1 anterolisthesis of L4 on L5.  EXAM: CHEST - 2 VIEW    COMPARISON:  None Available.   FINDINGS: Frontal and lateral views of the chest demonstrate an enlarged cardiac silhouette. No airspace disease, effusion, or pneumothorax. No acute bony abnormalities.   IMPRESSION: 1. Enlarged cardiac silhouette.  No acute airspace disease.   Medication List       STOP taking these medications     telmisartan  40 MG tablet Commonly known as: MICARDIS            TAKE these medications     ascorbic acid  500 MG tablet Commonly known as: VITAMIN C  Take 500 mg by mouth daily.    calcium  carbonate 1500 (600 Ca) MG Tabs tablet Commonly known as: OSCAL Take by mouth.    diltiazem  30 MG tablet Commonly known as: CARDIZEM  Take 1 tablet (30 mg total) by mouth 3 (three) times daily.    Eliquis  5 MG Tabs tablet Generic drug: apixaban  TAKE 1 TABLET BY MOUTH TWICE A DAY    gabapentin  300 MG capsule Commonly known as: NEURONTIN  TAKE 1 CAPSULE BY MOUTH EVERYDAY AT BEDTIME    HORSE CHESTNUT PO Take 1 tablet by mouth daily.    levothyroxine  50 MCG tablet Commonly known as:  SYNTHROID  TAKE 1 TABLET BY MOUTH EVERY DAY BEFORE BREAKFAST    magnesium  chloride 64 MG Tbec SR tablet Commonly known as: SLOW-MAG Take 1 tablet (64 mg total) by mouth at bedtime.    methocarbamol  750 MG tablet Commonly known as: ROBAXIN  Take 1 tablet (750 mg total) by mouth 2 (two) times daily as needed for muscle spasms.    metoprolol  succinate 50 MG 24 hr tablet Commonly known as: TOPROL -XL TAKE 1 TABLET (50 MG TOTAL) BY MOUTH TWICE A DAY .TAKE WITH OR IMMEDIATELY FOLLOWING A MEAL    nystatin  powder Commonly known as: MYCOSTATIN /NYSTOP  Apply 1 Application topically 3 (three) times daily.    polyethylene glycol powder 17 GM/SCOOP powder Commonly known as: GLYCOLAX /MIRALAX  Take 17 g by mouth daily. Dissolve 1 capful in 8 oz of liquid daily PRN What changed:  when to take this reasons to take this additional instructions    Tylenol  8 Hour Arthritis Pain 650  MG CR tablet Generic drug: acetaminophen  Take 650 mg by mouth every 8 (eight) hours as needed for pain.    VITAMIN D  (CHOLECALCIFEROL) PO           09/06/2023    1:27 PM 12/15/2022    2:03 PM 09/07/2022    3:21 PM  Depression screen PHQ 2/9  Decreased Interest 0 0 0  Down, Depressed, Hopeless 0 0 0  PHQ - 2 Score 0 0 0  Altered sleeping 0    Tired, decreased energy 0    Change in appetite 0    Feeling bad or failure about yourself  0    Trouble concentrating 0    Moving slowly or fidgety/restless 0    Suicidal thoughts 0    PHQ-9 Score 0    Difficult doing work/chores Not difficult at all      Relevant past medical, surgical, family and social history reviewed and updated as indicated. Interim medical history since our last visit reviewed. Allergies and medications reviewed and updated.  Review of Systems  Per HPI unless specifically indicated above     Objective:      BP 126/72   Pulse 98   Temp 98 F (36.7 C)   Resp 18   Ht 5' 9 (1.753 m)   SpO2 96%   BMI 32.78 kg/m    Wt Readings from Last 3 Encounters:  12/04/23 222 lb 0.1 oz (100.7 kg)  09/06/23 229 lb (103.9 kg)  08/09/23 223 lb 8 oz (101.4 kg)    Physical Exam VITALS: BP- 126/72 GENERAL: Alert, cooperative, well developed, no acute distress HEENT: Normocephalic, normal oropharynx, moist mucous membranes CHEST: Clear to auscultation bilaterally, No wheezes, rhonchi, or crackles CARDIOVASCULAR: Normal heart rate and rhythm, S1 and S2 normal without murmurs ABDOMEN: Soft, non-tender, non-distended, without organomegaly, Normal bowel sounds EXTREMITIES: No cyanosis or edema NEUROLOGICAL: Cranial nerves grossly intact, Moves all extremities without gross motor or sensory deficit  Results for orders placed or performed during the hospital encounter of 12/03/23  Urine Culture (for pregnant, neutropenic or urologic patients or patients with an indwelling urinary catheter)   Collection Time: 12/03/23  4:09  PM   Specimen: Urine, Random  Result Value Ref Range   Specimen Description      URINE, RANDOM Performed at [facility], [address], [place], [place] [address]    Special Requests      NONE Performed at [facility], [address], [place], [place] [address]    Culture MULTIPLE SPECIES PRESENT, SUGGEST  RECOLLECTION (A)    Report Status 12/05/2023 FINAL   CBC with Differential   Collection Time: 12/03/23  4:09 PM  Result Value Ref Range   WBC 10.9 (H) 4.0 - 10.5 K/uL   RBC 4.01 3.87 - 5.11 MIL/uL   Hemoglobin 11.2 (L) 12.0 - 15.0 g/dL   HCT 65.1 (L) 63.9 - 53.9 %   MCV 86.8 80.0 - 100.0 fL   MCH 27.9 26.0 - 34.0 pg   MCHC 32.2 30.0 - 36.0 g/dL   RDW 86.9 88.4 - 84.4 %   Platelets 253 150 - 400 K/uL   nRBC 0.0 0.0 - 0.2 %   Neutrophils Relative % 78 %   Neutro Abs 8.5 (H) 1.7 - 7.7 K/uL   Lymphocytes Relative 11 %   Lymphs Abs 1.2 0.7 - 4.0 K/uL   Monocytes Relative 10 %   Monocytes Absolute 1.1 (H) 0.1 - 1.0 K/uL   Eosinophils Relative 0 %   Eosinophils Absolute 0.0 0.0 - 0.5 K/uL   Basophils Relative 0 %   Basophils Absolute 0.0 0.0 - 0.1 K/uL   Immature Granulocytes 1 %   Abs Immature Granulocytes 0.05 0.00 - 0.07 K/uL  Comprehensive metabolic panel   Collection Time: 12/03/23  4:09 PM  Result Value Ref Range   Sodium 137 135 - 145 mmol/L   Potassium 3.1 (L) 3.5 - 5.1 mmol/L   Chloride 107 98 - 111 mmol/L   CO2 20 (L) 22 - 32 mmol/L   Glucose, Bld 102 (H) 70 - 99 mg/dL   BUN 20 8 - 23 mg/dL   Creatinine, Ser 9.34 0.44 - 1.00 mg/dL   Calcium  9.2 8.9 - 10.3 mg/dL   Total Protein 6.9 6.5 - 8.1 g/dL   Albumin 3.8 3.5 - 5.0 g/dL   AST 54 (H) 15 - 41 U/L   ALT 22 0 - 44 U/L   Alkaline Phosphatase 62 38 - 126 U/L   Total Bilirubin 1.1 0.0 - 1.2 mg/dL   GFR, Estimated >39 >39 mL/min   Anion gap 10 5 - 15  Magnesium    Collection Time: 12/03/23  4:09 PM  Result Value Ref Range   Magnesium  1.8 1.7 - 2.4 mg/dL  TSH   Collection Time:  12/03/23  4:09 PM  Result Value Ref Range   TSH 1.830 0.350 - 4.500 uIU/mL  T4, free   Collection Time: 12/03/23  4:09 PM  Result Value Ref Range   Free T4 1.11 0.61 - 1.12 ng/dL  Urinalysis, Routine w reflex microscopic -Urine, Clean Catch   Collection Time: 12/03/23  4:09 PM  Result Value Ref Range   Color, Urine YELLOW (A) YELLOW   APPearance HAZY (A) CLEAR   Specific Gravity, Urine 1.025 1.005 - 1.030   pH 5.0 5.0 - 8.0   Glucose, UA NEGATIVE NEGATIVE mg/dL   Hgb urine dipstick NEGATIVE NEGATIVE   Bilirubin Urine NEGATIVE NEGATIVE   Ketones, ur NEGATIVE NEGATIVE mg/dL   Protein, ur 30 (A) NEGATIVE mg/dL   Nitrite NEGATIVE NEGATIVE   Leukocytes,Ua TRACE (A) NEGATIVE   RBC / HPF 11-20 0 - 5 RBC/hpf   WBC, UA 0-5 0 - 5 WBC/hpf   Bacteria, UA FEW (A) NONE SEEN   Squamous Epithelial / HPF 0-5 0 - 5 /HPF   Hyaline Casts, UA PRESENT   Procalcitonin   Collection Time: 12/03/23  4:09 PM  Result Value Ref Range   Procalcitonin 0.21 ng/mL  Sedimentation rate   Collection Time: 12/03/23  4:09 PM  Result Value Ref Range   Sed Rate 13 0 - 22 mm/hr  Phosphorus   Collection Time: 12/03/23  4:09 PM  Result Value Ref Range   Phosphorus 1.9 (L) 2.5 - 4.6 mg/dL  Troponin I (High Sensitivity)   Collection Time: 12/03/23  4:09 PM  Result Value Ref Range   Troponin I (High Sensitivity) 33 (H) <18 ng/L  Troponin I (High Sensitivity)   Collection Time: 12/03/23  5:37 PM  Result Value Ref Range   Troponin I (High Sensitivity) 38 (H) <18 ng/L  Culture, blood (Routine X 2) w Reflex to ID Panel   Collection Time: 12/03/23 11:39 PM   Specimen: BLOOD  Result Value Ref Range   Specimen Description BLOOD left AC    Special Requests      BOTTLES DRAWN AEROBIC AND ANAEROBIC Blood Culture results may not be optimal due to an inadequate volume of blood received in culture bottles   Culture      NO GROWTH 5 DAYS Performed at [facility], [address] [place], [place] [address]     Report Status 12/08/2023 FINAL   Troponin I (High Sensitivity)   Collection Time: 12/03/23 11:39 PM  Result Value Ref Range   Troponin I (High Sensitivity) 28 (H) <18 ng/L  Culture, blood (Routine X 2) w Reflex to ID Panel   Collection Time: 12/03/23 11:45 PM   Specimen: BLOOD  Result Value Ref Range   Specimen Description BLOOD left forearm    Special Requests      BOTTLES DRAWN AEROBIC AND ANAEROBIC Blood Culture results may not be optimal due to an inadequate volume of blood received in culture bottles   Culture      NO GROWTH 5 DAYS Performed at [facility], [address] [place], [place] [address]    Report Status 12/08/2023 FINAL   C-reactive protein   Collection Time: 12/03/23 11:45 PM  Result Value Ref Range   CRP 6.4 (H) <1.0 mg/dL  Cortisol   Collection Time: 12/03/23 11:45 PM  Result Value Ref Range   Cortisol, Plasma 17.4 ug/dL  Basic metabolic panel   Collection Time: 12/04/23  5:44 AM  Result Value Ref Range   Sodium 139 135 - 145 mmol/L   Potassium 4.4 3.5 - 5.1 mmol/L   Chloride 108 98 - 111 mmol/L   CO2 23 22 - 32 mmol/L   Glucose, Bld 117 (H) 70 - 99 mg/dL   BUN 19 8 - 23 mg/dL   Creatinine, Ser 9.37 0.44 - 1.00 mg/dL   Calcium  9.0 8.9 - 10.3 mg/dL   GFR, Estimated >39 >39 mL/min   Anion gap 8 5 - 15  CBC   Collection Time: 12/04/23  5:44 AM  Result Value Ref Range   WBC 8.1 4.0 - 10.5 K/uL   RBC 4.14 3.87 - 5.11 MIL/uL   Hemoglobin 11.6 (L) 12.0 - 15.0 g/dL   HCT 63.6 63.9 - 53.9 %   MCV 87.7 80.0 - 100.0 fL   MCH 28.0 26.0 - 34.0 pg   MCHC 32.0 30.0 - 36.0 g/dL   RDW 86.9 88.4 - 84.4 %   Platelets 262 150 - 400 K/uL   nRBC 0.0 0.0 - 0.2 %  Phosphorus   Collection Time: 12/04/23  5:44 AM  Result Value Ref Range   Phosphorus 3.5 2.5 - 4.6 mg/dL  Uric acid   Collection Time: 12/05/23  3:19 AM  Result Value Ref Range  Uric Acid, Serum 5.8 2.5 - 7.1 mg/dL  Basic metabolic panel   Collection Time: 12/05/23  3:19 AM  Result Value  Ref Range   Sodium 138 135 - 145 mmol/L   Potassium 4.3 3.5 - 5.1 mmol/L   Chloride 107 98 - 111 mmol/L   CO2 24 22 - 32 mmol/L   Glucose, Bld 102 (H) 70 - 99 mg/dL   BUN 14 8 - 23 mg/dL   Creatinine, Ser 9.37 0.44 - 1.00 mg/dL   Calcium  9.0 8.9 - 10.3 mg/dL   GFR, Estimated >39 >39 mL/min   Anion gap 7 5 - 15  Phosphorus   Collection Time: 12/05/23  3:19 AM  Result Value Ref Range   Phosphorus 3.1 2.5 - 4.6 mg/dL          Assessment & Plan:   Problem List Items Addressed This Visit       Cardiovascular and Mediastinum   Essential hypertension   Relevant Orders   CBC with Differential/Platelet   Comprehensive metabolic panel with GFR   Chronic atrial fibrillation (HCC)   Relevant Orders   CBC with Differential/Platelet   Comprehensive metabolic panel with GFR   Lipid panel   Phosphorus   Magnesium    Aortic atherosclerosis   Relevant Orders   Comprehensive metabolic panel with GFR   Lipid panel     Endocrine   Hypothyroidism - Primary (Chronic)   Relevant Orders   TSH     Musculoskeletal and Integument   Primary osteoarthritis of both knees     Other   Obesity (BMI 30.0-34.9)   Relevant Orders   TSH   Prediabetes   Relevant Orders   Comprehensive metabolic panel with GFR   Hemoglobin A1c   Dyslipidemia   Relevant Orders   Lipid panel   Other Visit Diagnoses       Hypophosphatemia       Relevant Orders   Phosphorus        Assessment and Plan Assessment & Plan Atrial fibrillation with rapid ventricular response Atrial fibrillation with rapid ventricular response during recent hospitalization, managed with metoprolol , Eliquis , and Cardizem . Elevated blood pressure and heart rate during the episode may have led to decreased blood supply to the legs, causing weakness. Explained that blood shunting can occur, similar to marathoners, leading to decreased blood supply to certain areas. - Contact cardiology to schedule an appointment - Continue  metoprolol  50 mg twice a day - Continue Eliquis  5 mg twice a day - Continue Cardizem  30 mg three times a day - Pick up prescribed medications from the pharmacy - Order magnesium  level to monitor due to atrial fibrillation  Hypertension Hypertension with elevated blood pressure during recent hospitalization but currently well-controlled at 126/72 mmHg.  Osteoarthritis of both knees Chronic osteoarthritis in both knees confirmed by imaging. Recent episode of weakness in legs possibly related to atrial fibrillation episode. Currently using a walker and considering physical therapy. Discussed the importance of physical therapy and home health services for mobility improvement. - Contact [facility] to confirm physical therapy services - Consider orthopedic referral if needed - Encourage continuation of physical therapy and occupational therapy as recommended  Lumbar spinal stenosis Lumbar spinal stenosis confirmed by MRI at levels L3-L4, L4-L5, L5-S1, L2-L3, contributing to lower extremity symptoms.  Hypothyroidism Hypothyroidism with no specific issues discussed during this visit. - Order TSH level  Dyslipidemia Dyslipidemia, not currently on cholesterol lowering medication,  did not tolerate statins - Order lipid panel  Prediabetes Prediabetes with no specific issues discussed during this visit. - Order A1c  Obesity Obesity with encouragement to continue working on lifestyle modifications.  Anemia - Order CBC, recheck level        Follow up plan: No follow-ups on file.

## 2024-01-10 ENCOUNTER — Telehealth: Payer: Self-pay

## 2024-01-10 ENCOUNTER — Ambulatory Visit: Payer: Self-pay | Admitting: Nurse Practitioner

## 2024-01-10 DIAGNOSIS — R29898 Other symptoms and signs involving the musculoskeletal system: Secondary | ICD-10-CM

## 2024-01-10 DIAGNOSIS — R269 Unspecified abnormalities of gait and mobility: Secondary | ICD-10-CM

## 2024-01-10 LAB — COMPREHENSIVE METABOLIC PANEL WITH GFR
AG Ratio: 1.6 (calc) (ref 1.0–2.5)
ALT: 8 U/L (ref 6–29)
AST: 12 U/L (ref 10–35)
Albumin: 4.1 g/dL (ref 3.6–5.1)
Alkaline phosphatase (APISO): 67 U/L (ref 37–153)
BUN: 20 mg/dL (ref 7–25)
CO2: 27 mmol/L (ref 20–32)
Calcium: 9.7 mg/dL (ref 8.6–10.4)
Chloride: 102 mmol/L (ref 98–110)
Creat: 0.78 mg/dL (ref 0.60–1.00)
Globulin: 2.5 g/dL (ref 1.9–3.7)
Glucose, Bld: 91 mg/dL (ref 65–99)
Potassium: 4.3 mmol/L (ref 3.5–5.3)
Sodium: 137 mmol/L (ref 135–146)
Total Bilirubin: 0.5 mg/dL (ref 0.2–1.2)
Total Protein: 6.6 g/dL (ref 6.1–8.1)
eGFR: 77 mL/min/1.73m2 (ref >=60)

## 2024-01-10 LAB — LIPID PANEL
Cholesterol: 216 mg/dL — ABNORMAL HIGH (ref <200)
HDL: 61 mg/dL (ref >=50)
LDL Cholesterol (Calc): 138 mg/dL — ABNORMAL HIGH
Non-HDL Cholesterol (Calc): 155 mg/dL — ABNORMAL HIGH (ref <130)
Total CHOL/HDL Ratio: 3.5 (calc) (ref <5.0)
Triglycerides: 73 mg/dL (ref <150)

## 2024-01-10 LAB — CBC WITH DIFFERENTIAL/PLATELET
Absolute Lymphocytes: 1230 {cells}/uL (ref 850–3900)
Absolute Monocytes: 708 {cells}/uL (ref 200–950)
Basophils Absolute: 42 {cells}/uL (ref 0–200)
Basophils Relative: 0.7 %
Eosinophils Absolute: 72 {cells}/uL (ref 15–500)
Eosinophils Relative: 1.2 %
HCT: 41.3 % (ref 35.0–45.0)
Hemoglobin: 12.9 g/dL (ref 11.7–15.5)
MCH: 27.8 pg (ref 27.0–33.0)
MCHC: 31.2 g/dL — ABNORMAL LOW (ref 32.0–36.0)
MCV: 89 fL (ref 80.0–100.0)
MPV: 9.3 fL (ref 7.5–12.5)
Monocytes Relative: 11.8 %
Neutro Abs: 3948 {cells}/uL (ref 1500–7800)
Neutrophils Relative %: 65.8 %
Platelets: 311 Thousand/uL (ref 140–400)
RBC: 4.64 Million/uL (ref 3.80–5.10)
RDW: 12.6 % (ref 11.0–15.0)
Total Lymphocyte: 20.5 %
WBC: 6 Thousand/uL (ref 3.8–10.8)

## 2024-01-10 LAB — HEMOGLOBIN A1C
Hgb A1c MFr Bld: 5.8 % — ABNORMAL HIGH (ref <5.7)
Mean Plasma Glucose: 120 mg/dL
eAG (mmol/L): 6.6 mmol/L

## 2024-01-10 LAB — TSH: TSH: 1.93 m[IU]/L (ref 0.40–4.50)

## 2024-01-10 LAB — PHOSPHORUS: Phosphorus: 3.6 mg/dL (ref 2.1–4.3)

## 2024-01-10 LAB — MAGNESIUM: Magnesium: 1.9 mg/dL (ref 1.5–2.5)

## 2024-01-10 NOTE — Telephone Encounter (Signed)
 Copied from CRM [id number]. Topic: General - Other >> Jan 10, 2024  3:13 PM Rebecca Anthony wrote: Reason for CRM: Patient would like Dr. Gareth to give her a call regarding therapy scheduled. Spoke with insurance and is stating that Dr. Gareth send it for [facility] would like to speak with her before she sends it in. Please call [phone]

## 2024-01-10 NOTE — Telephone Encounter (Signed)
 Please send home health referral to [facility] please

## 2024-01-18 ENCOUNTER — Ambulatory Visit: Payer: Self-pay

## 2024-01-18 NOTE — Telephone Encounter (Signed)
 FYI Only or Action Required?: FYI only for provider.  Patient was last seen in primary care on 01/09/2024 by Rebecca Mliss FALCON, FNP.  Called Nurse Triage reporting Advice Only.  Symptoms began No Symptoms.  Interventions attempted: Other: N/A.  Symptoms are: stable.  Triage Disposition: Information or Advice Only Call  Patient/caregiver understands and will follow disposition?: Yes  Copied from CRM [id number]. Topic: Clinical - Lab/Test Results >> Jan 18, 2024  4:22 PM Rebecca Anthony wrote: Reason for CRM: Patient has questions about lab results from 9/24  Reason for Disposition  General information question, no triage required and triager able to answer question  Answer Assessment - Initial Assessment Questions 1. REASON FOR CALL: What is the main reason for your call? or How can I best help you?     Questions about labwork.  2. SYMPTOMS : Do you have any symptoms?      No  3. OTHER QUESTIONS: Do you have any other questions?     No  Protocols used: Information Only Call - No Triage-A-AH

## 2024-01-21 ENCOUNTER — Telehealth: Payer: Self-pay

## 2024-01-21 NOTE — Telephone Encounter (Signed)
 Copied from CRM [id number]. Topic: Clinical - Medical Advice >> Jan 18, 2024  4:19 PM Antwanette L wrote: Reason for CRM: The patient has questions about a note she saw on her MyChart indicating that her anemia has improved. She was not previously informed that she was anemic and would like clarification. The patient is requesting a callback from either Satilla or Jamie to discuss this. The patient can be contacted at [phone] after 12pm.

## 2024-01-28 ENCOUNTER — Telehealth: Payer: Self-pay

## 2024-01-28 NOTE — Telephone Encounter (Signed)
 Copied from CRM [id number]. Topic: Clinical - Medication Question >> Jan 28, 2024  2:27 PM Rebecca Anthony wrote: Reason for CRM: patient wanted to see if she could take this medication (diltiazem  (CARDIZEM ) 30 MG tablet) with her other BP medication. She hasn't began taking it yet. Please advise.  Call back:[phone]

## 2024-01-29 NOTE — Telephone Encounter (Signed)
Called patient and made aware. Patient verbalized understanding.

## 2024-01-31 ENCOUNTER — Telehealth: Payer: Self-pay | Admitting: Cardiovascular Disease

## 2024-01-31 NOTE — Telephone Encounter (Signed)
 Patient stated she was returning RN Summer's call.

## 2024-01-31 NOTE — Telephone Encounter (Signed)
 Pt requesting a c/b to discuss medication changes made in the hospital

## 2024-02-01 NOTE — Addendum Note (Signed)
 Addended by: BRIEN SALM on: 02/01/2024 01:02 PM   Modules accepted: Orders

## 2024-02-01 NOTE — Telephone Encounter (Signed)
 Talked to patient regarding medication. Patient during last hospital visit was given cardizem  30 to start taking but has not started taking because she was told it increases the bleeding risk in conjunction with eliquis . Told patient this medication conjunction is given together a ton in atrial fibrillation patients to prevent stroke and heart attack along with keeping the heart in rhythm. Patient verbalized understanding and will start taking the medication. Will route this message to Dr Gollan since it is the provider she sees.

## 2024-02-01 NOTE — Telephone Encounter (Signed)
 Patient returned RN's call.

## 2024-02-01 NOTE — Telephone Encounter (Signed)
Attempted to call patient back. Left voicemail.

## 2024-02-01 NOTE — Telephone Encounter (Signed)
 Talked to patient.

## 2024-02-18 ENCOUNTER — Ambulatory Visit: Payer: Self-pay | Admitting: Nurse Practitioner

## 2024-02-18 ENCOUNTER — Ambulatory Visit: Admitting: Podiatry

## 2024-02-18 ENCOUNTER — Telehealth: Payer: Self-pay

## 2024-02-18 DIAGNOSIS — D2372 Other benign neoplasm of skin of left lower limb, including hip: Secondary | ICD-10-CM

## 2024-02-18 DIAGNOSIS — B351 Tinea unguium: Secondary | ICD-10-CM

## 2024-02-18 DIAGNOSIS — D689 Coagulation defect, unspecified: Secondary | ICD-10-CM

## 2024-02-18 DIAGNOSIS — M79676 Pain in unspecified toe(s): Secondary | ICD-10-CM

## 2024-02-18 NOTE — Telephone Encounter (Signed)
 This RN called Lorenza who stated the PCP office already called with verbal orders.     Copied from CRM [id number]. Topic: Clinical - Red Word Triage >> Feb 18, 2024  1:24 PM Donna BRAVO wrote: Red Word that prompted transfer to Nurse Triage: Korene  [phone] [facility] home health  patient noticed states when lying down at night has left side pain. Intermediate pain and is not getting any worse  Tarsha asked to speak with nurse triage >> Feb 18, 2024  1:30 PM Donna BRAVO wrote: Korene  [phone] [facility] home health On hold for Nurse Triage for 5 minutes. Korene stated to have a nurse call her back.

## 2024-02-18 NOTE — Telephone Encounter (Signed)
 VO given.

## 2024-02-18 NOTE — Telephone Encounter (Signed)
 Copied from CRM [id number]. Topic: Clinical - Home Health Verbal Orders >> Feb 18, 2024  1:19 PM Donna BRAVO wrote: Caller/Agency:  Korene [phone] [facility] home health  Callback Number: [phone] Service Requested: home health aid, medical social worker Frequency: patient is asking for these services help with showers / personal care and transportation and community resources  Any new concerns about the patient? Yes - patient noticed states when lying down at night has left side pain. Intermediate pain and is not getting any worse

## 2024-02-18 NOTE — Progress Notes (Signed)
 Presents today for chief complaint of painful elongated nails and calluses bilateral.  Objective: Vital signs are stable alert oriented x 3 pulses are palpable.  No open lesions or wounds are noted.  She has significant callus buildup benign skin lesion buildup to the plantar aspect of the forefoot bilateral toenails are long thick yellow dystrophic like mycotic sharply incurvated tender on palpation.  Assessment: Pain in limb secondary to onychomycosis benign skin lesions.  Plan: Debridement of toenails 1 through 5 bilateral debridement of benign skin lesions.  She will also use Voltaren gel for the occasional heel pain and midfoot pain.

## 2024-03-05 ENCOUNTER — Other Ambulatory Visit: Payer: Self-pay

## 2024-03-05 NOTE — Telephone Encounter (Signed)
 Copied from CRM #[id number]. Topic: Clinical - Medication Question >> Mar 04, 2024  5:04 PM Shanda MATSU wrote: Reason for CRM: Patient wants to know if med, diltiazem  (CARDIZEM ) 30 MG tablet, can be refilled by PCP even though PCP did not prescribe medication, patient stated she believes provider at rehab facility prescribed medication, patient's preferred pharmacy is  [id number] [address], [place] - [address] [place] [address] Phone: [phone] Fax: [phone]

## 2024-03-08 ENCOUNTER — Other Ambulatory Visit: Payer: Self-pay | Admitting: Cardiology

## 2024-03-08 MED ORDER — DILTIAZEM HCL 30 MG PO TABS: 30.0000 mg | ORAL_TABLET | Freq: Three times a day (TID) | ORAL | 0 refills | Status: DC

## 2024-03-08 NOTE — Progress Notes (Signed)
 Patient called in requesting a refill on Diltiazem  30mg  TID which was started while in rehab. Has appt in the cardiology office on Tuesday. I will refill for 10 days as I anticipate there will be changes to her meds at follow up visit this week.

## 2024-03-11 ENCOUNTER — Ambulatory Visit: Attending: Cardiovascular Disease | Admitting: Cardiovascular Disease

## 2024-03-11 ENCOUNTER — Encounter: Payer: Self-pay | Admitting: Cardiovascular Disease

## 2024-03-11 VITALS — BP 140/80 | HR 92 | Ht 69.0 in | Wt 227.0 lb

## 2024-03-11 DIAGNOSIS — I4821 Permanent atrial fibrillation: Secondary | ICD-10-CM

## 2024-03-11 DIAGNOSIS — E785 Hyperlipidemia, unspecified: Secondary | ICD-10-CM

## 2024-03-11 DIAGNOSIS — I700 Atherosclerosis of aorta: Secondary | ICD-10-CM

## 2024-03-11 DIAGNOSIS — I480 Paroxysmal atrial fibrillation: Secondary | ICD-10-CM

## 2024-03-11 DIAGNOSIS — I10 Essential (primary) hypertension: Secondary | ICD-10-CM

## 2024-03-11 DIAGNOSIS — R7303 Prediabetes: Secondary | ICD-10-CM

## 2024-03-11 MED ORDER — METOPROLOL SUCCINATE ER 100 MG PO TB24: 100.0000 mg | ORAL_TABLET | Freq: Every day | ORAL | 3 refills | Status: AC

## 2024-03-11 MED ORDER — DILTIAZEM HCL ER COATED BEADS 120 MG PO CP24: 120.0000 mg | ORAL_CAPSULE | Freq: Every day | ORAL | 3 refills | Status: AC

## 2024-03-11 NOTE — Patient Instructions (Addendum)
 Medication Instructions:   Please hold the diltiazem  30 pills Start diltiazem  ER 120 mg daily in the Am with breakfast  Change the metoprolol  succinate 100 mg in the PM with dinner  Hold the telmisartan ,  Check the pressures late morning, Call with numbers If blood pressure is elevated near 140, we will start telmisartan   If you need a refill on your cardiac medications before your next appointment, please call your pharmacy.   Lab work: No new labs needed  Testing/Procedures: No new testing needed  Follow-Up: At [facility], you and your health needs are our priority.  As part of our continuing mission to provide you with exceptional heart care, we have created designated Provider Care Teams.  These Care Teams include your primary Cardiologist (physician) and Advanced Practice Providers (APPs -  Physician Assistants and Nurse Practitioners) who all work together to provide you with the care you need, when you need it.  You will need a follow up appointment in 6 months, APP ok  Providers on your designated Care Team:   Lonni Meager, NP Bernardino Bring, PA-C Cadence [place], [place]  COVID-19 Vaccine Information can be found at: [email or web address] For questions related to vaccine distribution or appointments, please email vaccine@ [email or web address] or call [phone])[phone].

## 2024-03-11 NOTE — Progress Notes (Signed)
 Date:  03/11/2024   ID:  Rebecca, Anthony 01/23/1946, MRN [id number]  Patient Location:  [address] [place] [address]   Provider location:   Anthony Nicolas, [place] office  PCP:  Rebecca Mliss FALCON, FNP  Cardiologist:  Rebecca Anthony [facility]  Chief Complaint  Patient presents with   6 month follow up     Patient would like to discuss Metoprolol  & diltiazem .     History of Present Illness:    Rebecca Anthony is a 78 y.o. female past medical history of Hyperlipidemia Permanent atrial fibrillation  Who presents for follow-up of her aortic atherosclerosis, hyperlipidemia, atrial fibrillation, HTN  Last seen by myself in clinic 8/24  Acute on chronic leg weakness Sept 18th 2025 Admitted to the hospital for further workup Noted to have A-fib with RVR, with heart rate 110-150 in ED.  Placed on Cardizem  drip initially, then low dose cardizem  She reported knees give out when she is trying to walk, weakness  Imaging reviewed MRI of the lumbar spine without cord compression to explain weakness, it does show multi-level generative changes including foraminal stenosis and grade 1 anterolisthesis of L4 on L5.   Went to rehab 1 week, did therapy Made it back home, doing PT 2x a week, legs remain weak Doing exercises, trouble transferring, no falls  Concerned about polypharmacy, taking diltiazem  30 mg 3 times daily, metoprolol  succinate 50 twice daily Tolerating Eliquis  5 twice daily   Reports that she restarted telmisartan  last night and it made her feel agitated  Takes gabapentin  300 before bed with Tylenol  to help her sleep  Prior imaging CT abdomen pelvis January 2018   Echocardiogram 2015, normal ejection function   EKG personally reviewed by myself on todays visit EKG Interpretation Date/Time:  Tuesday March 11 2024 16:47:25 EST Ventricular Rate:  92 PR Interval:    QRS Duration:  76 QT Interval:  354 QTC Calculation: 437 R  Axis:   10  Text Interpretation: Atrial fibrillation When compared with ECG of 03-Dec-2023 16:04, No significant change was found Confirmed by Rebecca Lye [phone]) on 03/11/2024 5:19:14 PM   Other past medical history reviewed prior history of atrial fibrillation, treated with quinidine when she was living in [place] .  She stopped taking it for some reason   2015, episode of rapid A. fib following her colonoscopy at [facility].  started on metoprolol  for rate control,  Several EKGs since that time 2015, 2017, 2019 documenting atrial fibrillation on my review In atrial fibrillation on today's visit, reports that occasionally she does feel palpitations but most the time is asymptomatic    Past Medical History:  Diagnosis Date   Atrial fibrillation with RVR (HCC) 12/03/2023   Chronic abdominal pain (LLQ) (Primary Area of Pain) (Left) 10/31/2017   Chronic ankle pain (Fourth Area of Pain) (Left) 11/07/2017   Chronic low back pain (Secondary Area of Pain) (Bilateral) (L>R) 10/31/2017   Chronic upper back pain [facility] Area of Pain) (Bilateral) (L>R) 10/31/2017   Diverticulitis    Diverticulosis    Dysuria    Heart murmur    Hematuria, unspecified    History of shingles    History of swelling of feet    Hypercholesterolemia    Hypertension    Hypokalemia 12/03/2023   Hypophosphatemia 12/04/2023   Hypothermia 12/03/2023   Hypothyroidism    Irritable bowel syndrome    Leukocytosis 12/03/2023   Lichenification and lichen simplex chronicus    Localized  superficial swelling, mass, or lump    Onychomycosis 03/23/2016   Osteoarthrosis, unspecified whether generalized or localized, unspecified site    Osteoporosis, unspecified    PAF (paroxysmal atrial fibrillation) (HCC)    Diagnosed years ago -- originally on Quinidine   Sigmoid diverticulosis 11/28/2017   Sinus problem    Synovial cyst, unspecified    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   Past Surgical History:  Procedure  Laterality Date   ABDOMINAL HYSTERECTOMY     ANKLE SURGERY Left    Torned tendon   APPENDECTOMY     AV FISTULA REPAIR     BUNIONECTOMY     CATARACT EXTRACTION W/PHACO Right 01/02/2019   Procedure: CATARACT EXTRACTION PHACO AND INTRAOCULAR LENS PLACEMENT (IOC) RIGHT;  Surgeon: Jaye Fallow, MD;  Location: [facility] ORS;  Service: Ophthalmology;  Laterality: Right;  US  00:42 CDE 7.91 fluid pack lot # [id number]   CATARACT EXTRACTION W/PHACO Left 02/07/2019   Procedure: CATARACT EXTRACTION PHACO AND INTRAOCULAR LENS PLACEMENT (IOC) LEFT;  Surgeon: Jaye Fallow, MD;  Location: [facility] ORS;  Service: Ophthalmology;  Laterality: Left;  US  00:36.3CDE 5.01Fluid Pack Lot # [id number]   COLONOSCOPY     FOOT SURGERY Left    VAGINAL HYSTERECTOMY     VESICOVAGINAL FISTULA CLOSURE W/ TAH        Allergies:   Anaprox [naproxen sodium]   Social History   Tobacco Use   Smoking status: Never   Smokeless tobacco: Never   Tobacco comments:    smoking cessation materials not required  Vaping Use   Vaping status: Never Used  Substance Use Topics   Alcohol use: No   Drug use: No     Current Outpatient Medications on File Prior to Visit  Medication Sig Dispense Refill   acetaminophen  (TYLENOL  8 HOUR ARTHRITIS PAIN) 650 MG CR tablet Take 650 mg by mouth every 8 (eight) hours as needed for pain.     calcium  carbonate (OSCAL) 1500 (600 Ca) MG TABS tablet Take by mouth.      diltiazem  (CARDIZEM ) 30 MG tablet Take 1 tablet (30 mg total) by mouth 3 (three) times daily. 30 tablet 0   ELIQUIS  5 MG TABS tablet TAKE 1 TABLET BY MOUTH TWICE A DAY 180 tablet 1   gabapentin  (NEURONTIN ) 300 MG capsule TAKE 1 CAPSULE BY MOUTH EVERYDAY AT BEDTIME 30 capsule 3   HORSE CHESTNUT PO Take 1 tablet by mouth daily.     levothyroxine  (SYNTHROID ) 50 MCG tablet TAKE 1 TABLET BY MOUTH EVERY DAY BEFORE BREAKFAST 90 tablet 3   methocarbamol  (ROBAXIN ) 750 MG tablet Take 1 tablet (750 mg total) by mouth 2 (two) times daily as  needed for muscle spasms. 60 tablet 1   metoprolol  succinate (TOPROL -XL) 50 MG 24 hr tablet TAKE 1 TABLET (50 MG TOTAL) BY MOUTH TWICE A DAY .TAKE WITH OR IMMEDIATELY FOLLOWING A MEAL 180 tablet 0   nystatin  (MYCOSTATIN /NYSTOP ) powder Apply 1 Application topically 3 (three) times daily. 15 g 0   polyethylene glycol powder (GLYCOLAX /MIRALAX ) powder Take 17 g by mouth daily. Dissolve 1 capful in 8 oz of liquid daily PRN 3350 g 1   telmisartan  (MICARDIS ) 40 MG tablet Take 40 mg by mouth daily.     vitamin C  (ASCORBIC ACID ) 500 MG tablet Take 500 mg by mouth daily.     VITAMIN D , CHOLECALCIFEROL, PO      No current facility-administered medications on file prior to visit.     Family Hx: The patient's family history includes Diabetes  in her mother; Healthy in her sister; Heart disease in her father; Heart failure in her mother; Hypertension in her father and mother; Thyroid  disease in her sister. There is no history of Breast cancer.  ROS:   Please see the history of present illness.    Review of Systems  Constitutional: Negative.   HENT: Negative.    Respiratory: Negative.    Cardiovascular: Negative.   Gastrointestinal: Negative.   Musculoskeletal: Negative.        Weakness  Neurological: Negative.   Psychiatric/Behavioral: Negative.    All other systems reviewed and are negative.    Labs/Other Tests and Data Reviewed:    Recent Labs: 01/09/2024: ALT 8; BUN 20; Creat 0.78; Hemoglobin 12.9; Magnesium  1.9; Platelets 311; Potassium 4.3; Sodium 137; TSH 1.93   Recent Lipid Panel Lab Results  Component Value Date/Time   CHOL 216 (H) 01/09/2024 03:50 PM   TRIG 73 01/09/2024 03:50 PM   HDL 61 01/09/2024 03:50 PM   CHOLHDL 3.5 01/09/2024 03:50 PM   LDLCALC 138 (H) 01/09/2024 03:50 PM    Wt Readings from Last 3 Encounters:  03/11/24 227 lb (103 kg)  12/04/23 222 lb 0.1 oz (100.7 kg)  09/06/23 229 lb (103.9 kg)     Exam:    BP (!) 140/80 (BP Location: Left Arm, Patient Position:  Sitting, Cuff Size: Normal)   Pulse 92   Ht 5' 9 (1.753 m)   Wt 227 lb (103 kg)   SpO2 97%   BMI 33.52 kg/m  Constitutional: Presents in a wheelchair, oriented to person, place, and time. No distress.  HENT:  Head: Normocephalic and atraumatic.  Eyes:  no discharge. No scleral icterus.  Neck: Normal range of motion. Neck supple. No JVD present.  Cardiovascular: Normal rate, regular rhythm, normal heart sounds and intact distal pulses. Exam reveals no gallop and no friction rub. No edema No murmur heard. Pulmonary/Chest: Effort normal and breath sounds normal. No stridor. No respiratory distress.  no wheezes.  no rales.  no tenderness.  Abdominal: Soft.  no distension.  no tenderness.  Musculoskeletal: Normal range of motion.  no  tenderness or deformity.  Neurological:  normal muscle tone. Coordination normal. No atrophy Skin: Skin is warm and dry. No rash noted. not diaphoretic.  Psychiatric:  normal mood and affect. behavior is normal. Thought content normal.    ASSESSMENT & PLAN:    Problem List Items Addressed This Visit       Cardiology Problems   Essential hypertension   Relevant Orders   EKG 12-Lead (Completed)   Aortic atherosclerosis   Relevant Orders   EKG 12-Lead (Completed)     Other   Dyslipidemia   Prediabetes   Other Visit Diagnoses       Permanent atrial fibrillation (HCC)    -  Primary   Relevant Orders   EKG 12-Lead (Completed)      Permanent atrial fibrillation In effort to consolidate her medications recommend she change diltiazem  30 mg 3 times daily to diltiazem  ER 120 mg daily in the morning -Suggest she take metoprolol  succinate 100 mg in the p.m. -Stay on Eliquis  5 twice daily for stroke prevention Monitor pulse at home  Essential hypertension Long discussion concerning polypharmacy Suggest she take medications as above in effort to cut down on the number of her pills - She can hold telmisartan  for now, monitor blood pressure with  metoprolol  and diltiazem  ER above - For systolic pressure approaching 859 we could restart telmisartan  or other  ARB  Chronic pain/leg weakness Hospital records reviewed Chronic back pain, knee pain Working with PT   Signed, Evalene Lunger, MD  [facility] [address], [place], [place] [address]

## 2024-03-17 ENCOUNTER — Ambulatory Visit: Admitting: Podiatry

## 2024-03-26 ENCOUNTER — Other Ambulatory Visit: Payer: Self-pay | Admitting: Podiatry

## 2024-03-28 ENCOUNTER — Ambulatory Visit

## 2024-04-25 ENCOUNTER — Ambulatory Visit

## 2024-05-02 ENCOUNTER — Telehealth: Payer: Self-pay

## 2024-05-02 NOTE — Telephone Encounter (Signed)
 Copied from CRM [id number]. Topic: Appointments - Scheduling Inquiry for Clinic >> May 02, 2024 12:39 PM Larissa S wrote: Reason for CRM: Patient requesting to schedule AWV. At this time schedule will not allow, please contact patient for scheduling.

## 2024-05-16 ENCOUNTER — Other Ambulatory Visit: Payer: Self-pay | Admitting: Cardiovascular Disease

## 2024-05-16 DIAGNOSIS — I480 Paroxysmal atrial fibrillation: Secondary | ICD-10-CM

## 2024-05-16 NOTE — Telephone Encounter (Signed)
 Eliquis  5mg  refill request received. Patient is 79 years old, weight-103kg, Crea-0.78 on 01/09/24, Diagnosis-Afib, and last seen by Dr. Gollan on 03/11/24. Dose is appropriate based on dosing criteria. Will send in refill to requested pharmacy.

## 2024-05-23 ENCOUNTER — Other Ambulatory Visit

## 2024-06-06 ENCOUNTER — Other Ambulatory Visit

## 2024-06-18 ENCOUNTER — Ambulatory Visit: Admitting: Podiatry

## 2024-06-20 ENCOUNTER — Ambulatory Visit

## 2024-07-10 ENCOUNTER — Ambulatory Visit: Admitting: Nurse Practitioner
